# Patient Record
Sex: Female | Born: 1980 | Hispanic: Yes | Marital: Married | Smoking: Former smoker
Health system: Southern US, Community
[De-identification: ages and names within clinical notes are randomized; demographics above are authoritative.]

## PROBLEM LIST (undated history)

## (undated) DIAGNOSIS — O24419 Gestational diabetes mellitus in pregnancy, unspecified control: Secondary | ICD-10-CM

## (undated) DIAGNOSIS — F319 Bipolar disorder, unspecified: Secondary | ICD-10-CM

## (undated) DIAGNOSIS — F32A Depression, unspecified: Secondary | ICD-10-CM

## (undated) DIAGNOSIS — F329 Major depressive disorder, single episode, unspecified: Secondary | ICD-10-CM

## (undated) DIAGNOSIS — R7303 Prediabetes: Secondary | ICD-10-CM

## (undated) DIAGNOSIS — J189 Pneumonia, unspecified organism: Secondary | ICD-10-CM

## (undated) DIAGNOSIS — F419 Anxiety disorder, unspecified: Secondary | ICD-10-CM

## (undated) HISTORY — DX: Gestational diabetes mellitus in pregnancy, unspecified control: O24.419

## (undated) HISTORY — DX: Bipolar disorder, unspecified: F31.9

## (undated) HISTORY — DX: Major depressive disorder, single episode, unspecified: F32.9

## (undated) HISTORY — DX: Depression, unspecified: F32.A

## (undated) HISTORY — PX: TUBAL LIGATION: SHX77

## (undated) HISTORY — DX: Anxiety disorder, unspecified: F41.9

---

## 2016-06-30 DIAGNOSIS — Z0389 Encounter for observation for other suspected diseases and conditions ruled out: Secondary | ICD-10-CM | POA: Insufficient documentation

## 2017-05-31 DIAGNOSIS — M7712 Lateral epicondylitis, left elbow: Secondary | ICD-10-CM | POA: Insufficient documentation

## 2018-09-17 ENCOUNTER — Encounter: Payer: Self-pay | Admitting: Adult Health

## 2018-09-17 ENCOUNTER — Ambulatory Visit (INDEPENDENT_AMBULATORY_CARE_PROVIDER_SITE_OTHER): Admitting: Adult Health

## 2018-09-17 VITALS — BP 128/88 | HR 95 | Resp 16 | Ht 67.0 in | Wt 273.0 lb

## 2018-09-17 DIAGNOSIS — F17219 Nicotine dependence, cigarettes, with unspecified nicotine-induced disorders: Secondary | ICD-10-CM

## 2018-09-17 DIAGNOSIS — R0683 Snoring: Secondary | ICD-10-CM

## 2018-09-17 DIAGNOSIS — F411 Generalized anxiety disorder: Secondary | ICD-10-CM | POA: Diagnosis not present

## 2018-09-17 DIAGNOSIS — G4700 Insomnia, unspecified: Secondary | ICD-10-CM

## 2018-09-17 DIAGNOSIS — F339 Major depressive disorder, recurrent, unspecified: Secondary | ICD-10-CM | POA: Diagnosis not present

## 2018-09-17 DIAGNOSIS — M67479 Ganglion, unspecified ankle and foot: Secondary | ICD-10-CM

## 2018-09-17 DIAGNOSIS — Z6841 Body Mass Index (BMI) 40.0 and over, adult: Secondary | ICD-10-CM | POA: Diagnosis not present

## 2018-09-17 DIAGNOSIS — G4719 Other hypersomnia: Secondary | ICD-10-CM

## 2018-09-17 MED ORDER — HYDROXYZINE HCL 10 MG PO TABS: 10.0000 mg | ORAL_TABLET | Freq: Every evening | ORAL | 0 refills | Status: DC | PRN

## 2018-09-17 NOTE — Progress Notes (Signed)
[facility] [place], [place] [address]  Internal MEDICINE  Office Visit Note  Patient Name: Gloria Garza  [id number]  [id number]  Date of Service: 09/17/2018   Complaints/HPI Pt is here for establishment of PCP. Chief Complaint  Patient presents with  . Depression    new patient   . Anxiety  . Insomnia  . Foot Pain    left foot dorsal has a cyst like bump . swelling and pressure    HPI Pt here to establish care. Gloria Garza is a 37 year old obese African-American female.  She is new to the area as her wife has been re-stationed here with the [place] [employer].  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  She denies any daily medication use at this time, reports that she has been on some medications in the past however it was not in the recent past.  She does report a history of depression and anxiety, as well as insomnia.  Her wife reports that she snores very loudly and the patient reports that she wakes up coughing sometimes.  She reports being excessively tired during the day, and requiring multiple caffeinated beverages to try and stay awake.  She denies ever having a sleep study performed.  Patient is a 1/2-1 whole pack of cigarette smoker a day, she reports some alcohol use, and denies illicit drug use.  She is also complaining of a small cystlike area on the dorsum of her left foot.  She feels that she is getting one on her right foot also.  Will refer to podiatry  Current Medication: Outpatient Encounter Medications as of 09/17/2018  Medication Sig  . hydrOXYzine (ATARAX/VISTARIL) 10 MG tablet Take 1 tablet (10 mg total) by mouth at bedtime as needed for anxiety.   No facility-administered encounter medications on file as of 09/17/2018.     Surgical History: Past Surgical History:  Procedure Laterality Date  . CESAREAN SECTION    . TUBAL LIGATION      Medical History: Past Medical History:  Diagnosis Date   . Anxiety   . Depression   . Gestational diabetes     Family History: Family History  Problem Relation Age of Onset  . Diabetes Maternal Grandmother   . Cancer - Colon Maternal Grandmother   . Diabetes Paternal Grandmother   . Heart disease Paternal Grandmother     Social History   Socioeconomic History  . Marital status: Married    Spouse name: Not on file  . Number of children: Not on file  . Years of education: Not on file  . Highest education level: Not on file  Occupational History  . Not on file  Social Needs  . Financial resource strain: Not on file  . Food insecurity:    Worry: Not on file    Inability: Not on file  . Transportation needs:    Medical: Not on file    Non-medical: Not on file  Tobacco Use  . Smoking status: Current Some Day Smoker    Types: Cigarettes  . Smokeless tobacco: Never Used  Substance and Sexual Activity  . Alcohol use: Yes    Comment: occasional   . Drug use: Never  . Sexual activity: Not on file  Lifestyle  . Physical activity:    Days per week: Not on file    Minutes per session: Not on file  . Stress: Not on file  Relationships  . Social connections:  Talks on phone: Not on file    Gets together: Not on file    Attends religious service: Not on file    Active member of club or organization: Not on file    Attends meetings of clubs or organizations: Not on file    Relationship status: Not on file  . Intimate partner violence:    Fear of current or ex partner: Not on file    Emotionally abused: Not on file    Physically abused: Not on file    Forced sexual activity: Not on file  Other Topics Concern  . Not on file  Social History Narrative  . Not on file     Review of Systems  Constitutional: Negative for chills, fatigue and unexpected weight change.  HENT: Negative for congestion, rhinorrhea, sneezing and sore throat.   Eyes: Negative for photophobia, pain and redness.  Respiratory: Negative for cough, chest  tightness and shortness of breath.   Cardiovascular: Negative for chest pain and palpitations.  Gastrointestinal: Negative for abdominal pain, constipation, diarrhea, nausea and vomiting.  Endocrine: Negative.   Genitourinary: Negative for dysuria and frequency.  Musculoskeletal: Negative for arthralgias, back pain, joint swelling and neck pain.  Skin: Negative for rash.  Allergic/Immunologic: Negative.   Neurological: Negative for tremors and numbness.  Hematological: Negative for adenopathy. Does not bruise/bleed easily.  Psychiatric/Behavioral: Negative for behavioral problems and sleep disturbance. The patient is not nervous/anxious.     Vital Signs: BP 128/88   Pulse 95   Resp 16   Ht 5\' 7"  (1.702 m)   Wt 273 lb (123.8 kg)   LMP 09/10/2018   SpO2 97%   BMI 42.76 kg/m    Physical Exam  Constitutional: She is oriented to person, place, and time. She appears well-developed and well-nourished. No distress.  HENT:  Head: Normocephalic and atraumatic.  Mouth/Throat: Oropharynx is clear and moist. No oropharyngeal exudate.  Eyes: Pupils are equal, round, and reactive to light. EOM are normal.  Neck: Normal range of motion. Neck supple. No JVD present. No tracheal deviation present. No thyromegaly present.  Cardiovascular: Normal rate, regular rhythm and normal heart sounds. Exam reveals no gallop and no friction rub.  No murmur heard. Pulmonary/Chest: Effort normal and breath sounds normal. No respiratory distress. She has no wheezes. She has no rales. She exhibits no tenderness.  Abdominal: Soft. There is no tenderness. There is no guarding.  Musculoskeletal: Normal range of motion.  Lymphadenopathy:    She has no cervical adenopathy.  Neurological: She is alert and oriented to person, place, and time. No cranial nerve deficit.  Skin: Skin is warm and dry. She is not diaphoretic.  Psychiatric: She has a normal mood and affect. Her behavior is normal. Judgment and thought  content normal.  Nursing note and vitals reviewed.  Assessment/Plan: 1. GAD (generalized anxiety disorder) We will treat patient's anxiety with a small dose of hydroxyzine.  We discussed side effects of this medication as well as to not use with alcohol.  She verbalized understanding and will follow-up in 1 month to see how she is doing. - hydrOXYzine (ATARAX/VISTARIL) 10 MG tablet; Take 1 tablet (10 mg total) by mouth at bedtime as needed for anxiety.  Dispense: 30 tablet; Refill: 0  2. Ganglion cyst of foot Patient has approximately 3 to 4 cm cyst present on dorsum of left foot.  She feels as though one is beginning on her right foot as well.  Refer to podiatry for treatment. - Ambulatory referral  to Podiatry  3. Cigarette nicotine dependence with nicotine-induced disorder Smoking cessation discussed with patient at this time.  She reports she has attempted quitting multiple times in the past and has used Wellbutrin with no success.  She reports she is currently smoking half to 1 whole pack of cigarettes per day. Smoking cessation counseling: 1. Pt acknowledges the risks of long term smoking, she will try to quite smoking. 2. Options for different medications including nicotine products, chewing gum, patch etc, Wellbutrin and Chantix is discussed 3. Goal and date of compete cessation is discussed 4. Total time spent in smoking cessation is 15 min.   4. Depression, recurrent ([place]) Patient reports she is no longer taking anything for depression.  States her depression is improved since this move because she is now closer to family.  We will continue to monitor in the future.  5. Insomnia, unspecified type Patient reports increased anxiety as well as insomnia we will try low-dose hydroxyzine to see if we can manage those symptoms. - Home sleep test - hydrOXYzine (ATARAX/VISTARIL) 10 MG tablet; Take 1 tablet (10 mg total) by mouth at bedtime as needed for anxiety.  Dispense: 30 tablet;  Refill: 0  6. Excessive daytime sleepiness Patient requires multiple caffeinated beverages a day, will get home sleep study to evaluate for OSA.  7. Loud snoring Patient's wife complains of loud snoring, will rule out OSA.  8. BMI 40.0-44.9, adult (HCC) Obesity Counseling: Risk Assessment: An assessment of behavioral risk factors was made today and includes lack of exercise sedentary lifestyle, lack of portion control and poor dietary habits.  Risk Modification Advice: She was counseled on portion control guidelines. Restricting daily caloric intake to. . The detrimental long term effects of obesity on her health and ongoing poor compliance was also discussed with the patient.    General Counseling: Malaiyah verbalizes understanding of the findings of todays visit and agrees with plan of treatment. I have discussed any further diagnostic evaluation that may be needed or ordered today. We also reviewed her medications today. she has been encouraged to call the office with any questions or concerns that should arise related to todays visit.  Orders Placed This Encounter  Procedures  . Ambulatory referral to Podiatry  . Home sleep test    Meds ordered this encounter  Medications  . hydrOXYzine (ATARAX/VISTARIL) 10 MG tablet    Sig: Take 1 tablet (10 mg total) by mouth at bedtime as needed for anxiety.    Dispense:  30 tablet    Refill:  0    Time spent: 35 Minutes   This patient was seen by Orson Gear AGNP-C in Collaboration with Dr Lavera Guise as a part of collaborative care agreement  Kendell Bane AGNP-C Internal Medicine

## 2018-09-17 NOTE — Patient Instructions (Signed)

## 2018-10-10 ENCOUNTER — Other Ambulatory Visit: Payer: Self-pay | Admitting: Internal Medicine

## 2018-10-29 ENCOUNTER — Encounter: Payer: Self-pay | Admitting: Adult Health

## 2018-10-29 ENCOUNTER — Ambulatory Visit (INDEPENDENT_AMBULATORY_CARE_PROVIDER_SITE_OTHER): Admitting: Adult Health

## 2018-10-29 VITALS — BP 112/78 | HR 100 | Resp 16 | Ht 67.0 in | Wt 277.0 lb

## 2018-10-29 DIAGNOSIS — Z6841 Body Mass Index (BMI) 40.0 and over, adult: Secondary | ICD-10-CM | POA: Diagnosis not present

## 2018-10-29 DIAGNOSIS — M67479 Ganglion, unspecified ankle and foot: Secondary | ICD-10-CM

## 2018-10-29 DIAGNOSIS — F411 Generalized anxiety disorder: Secondary | ICD-10-CM

## 2018-10-29 DIAGNOSIS — F17219 Nicotine dependence, cigarettes, with unspecified nicotine-induced disorders: Secondary | ICD-10-CM | POA: Diagnosis not present

## 2018-10-29 DIAGNOSIS — R5383 Other fatigue: Secondary | ICD-10-CM | POA: Diagnosis not present

## 2018-10-29 DIAGNOSIS — F329 Major depressive disorder, single episode, unspecified: Secondary | ICD-10-CM | POA: Diagnosis not present

## 2018-10-29 DIAGNOSIS — F32A Depression, unspecified: Secondary | ICD-10-CM

## 2018-10-29 DIAGNOSIS — G4700 Insomnia, unspecified: Secondary | ICD-10-CM

## 2018-10-29 MED ORDER — HYDROXYZINE HCL 10 MG PO TABS: 20.0000 mg | ORAL_TABLET | Freq: Every evening | ORAL | 2 refills | Status: DC | PRN

## 2018-10-29 NOTE — Progress Notes (Signed)
[facility] [address], [place] [address]  Internal MEDICINE  Office Visit Note  Patient Name: Gloria Garza  [id number]  [id number]  Date of Service: 10/29/2018  Chief Complaint  Patient presents with  . Foot Pain    still having foot pain having difficulty finding a place that accepts insurance , would like to discuss weight loss   . Anxiety    HPI  Patient is here for follow-up on foot pain as well as depression and anxiety.  She reports that the Vistaril worked well at night to help her sleep as well as to help with some of her anxiety symptoms.  She would like to continue taking that medication at this time.  She reports that she supposed to see podiatry however has not been scheduled yet.  They are working on her insurance and she is supposed to be scheduled sometime this week.  She also continues to report some fatigue and issues with her weight gain.  She would like to discuss bariatric surgery which we will do at this time.   Current Medication: Outpatient Encounter Medications as of 10/29/2018  Medication Sig  . hydrOXYzine (ATARAX/VISTARIL) 10 MG tablet Take 2 tablets (20 mg total) by mouth at bedtime as needed for anxiety.  . [DISCONTINUED] hydrOXYzine (ATARAX/VISTARIL) 10 MG tablet Take 1 tablet (10 mg total) by mouth at bedtime as needed for anxiety. (Patient not taking: Reported on 10/29/2018)   No facility-administered encounter medications on file as of 10/29/2018.     Surgical History: Past Surgical History:  Procedure Laterality Date  . CESAREAN SECTION    . TUBAL LIGATION      Medical History: Past Medical History:  Diagnosis Date  . Anxiety   . Depression   . Gestational diabetes     Family History: Family History  Problem Relation Age of Onset  . Diabetes Maternal Grandmother   . Cancer - Colon Maternal Grandmother   . Diabetes Paternal Grandmother   . Heart disease Paternal Grandmother     Social History   Socioeconomic  History  . Marital status: Married    Spouse name: Not on file  . Number of children: Not on file  . Years of education: Not on file  . Highest education level: Not on file  Occupational History  . Not on file  Social Needs  . Financial resource strain: Not on file  . Food insecurity:    Worry: Not on file    Inability: Not on file  . Transportation needs:    Medical: Not on file    Non-medical: Not on file  Tobacco Use  . Smoking status: Current Some Day Smoker    Types: Cigarettes  . Smokeless tobacco: Never Used  Substance and Sexual Activity  . Alcohol use: Yes    Comment: occasional   . Drug use: Never  . Sexual activity: Not on file  Lifestyle  . Physical activity:    Days per week: Not on file    Minutes per session: Not on file  . Stress: Not on file  Relationships  . Social connections:    Talks on phone: Not on file    Gets together: Not on file    Attends religious service: Not on file    Active member of club or organization: Not on file    Attends meetings of clubs or organizations: Not on file    Relationship status: Not on file  . Intimate partner violence:  Fear of current or ex partner: Not on file    Emotionally abused: Not on file    Physically abused: Not on file    Forced sexual activity: Not on file  Other Topics Concern  . Not on file  Social History Narrative  . Not on file      Review of Systems  Constitutional: Negative for chills, fatigue and unexpected weight change.  HENT: Negative for congestion, rhinorrhea, sneezing and sore throat.   Eyes: Negative for photophobia, pain and redness.  Respiratory: Negative for cough, chest tightness and shortness of breath.   Cardiovascular: Negative for chest pain and palpitations.  Gastrointestinal: Negative for abdominal pain, constipation, diarrhea, nausea and vomiting.  Endocrine: Negative.   Genitourinary: Negative for dysuria and frequency.  Musculoskeletal: Negative for arthralgias,  back pain, joint swelling and neck pain.  Skin: Negative for rash.  Allergic/Immunologic: Negative.   Neurological: Negative for tremors and numbness.  Hematological: Negative for adenopathy. Does not bruise/bleed easily.  Psychiatric/Behavioral: Negative for behavioral problems and sleep disturbance. The patient is not nervous/anxious.     Vital Signs: BP 112/78 (BP Location: Left Arm, Patient Position: Sitting, Cuff Size: Normal)   Pulse 100   Resp 16   Ht 5\' 7"  (1.702 m)   Wt 277 lb (125.6 kg)   SpO2 99%   BMI 43.38 kg/m    Physical Exam  Constitutional: She is oriented to person, place, and time. She appears well-developed and well-nourished. No distress.  HENT:  Head: Normocephalic and atraumatic.  Mouth/Throat: Oropharynx is clear and moist. No oropharyngeal exudate.  Eyes: Pupils are equal, round, and reactive to light. EOM are normal.  Neck: Normal range of motion. Neck supple. No JVD present. No tracheal deviation present. No thyromegaly present.  Cardiovascular: Normal rate, regular rhythm and normal heart sounds. Exam reveals no gallop and no friction rub.  No murmur heard. Pulmonary/Chest: Effort normal and breath sounds normal. No respiratory distress. She has no wheezes. She has no rales. She exhibits no tenderness.  Abdominal: Soft. There is no tenderness. There is no guarding.  Musculoskeletal: Normal range of motion.  Lymphadenopathy:    She has no cervical adenopathy.  Neurological: She is alert and oriented to person, place, and time. No cranial nerve deficit.  Skin: Skin is warm and dry. She is not diaphoretic.  Psychiatric: She has a normal mood and affect. Her behavior is normal. Judgment and thought content normal.  Nursing note and vitals reviewed.   Assessment/Plan: 1. Depression, unspecified depression type Once again will refer patient to psychiatry.  She signed information release from [facility] to send to psychiatry referral source. - Ambulatory  referral to Psychiatry  2. Fatigue, unspecified type Labs ordered for fatigue and for future bariatric consultation. - B12 and Folate Panel - TSH + free T4 - Fe+TIBC+Fer - Vitamin D 1,25 dihydroxy  3. GAD (generalized anxiety disorder) Refill patient's hydroxyzine and increased dose at this time. - hydrOXYzine (ATARAX/VISTARIL) 10 MG tablet; Take 2 tablets (20 mg total) by mouth at bedtime as needed for anxiety.  Dispense: 60 tablet; Refill: 2  4. Insomnia, unspecified type Patient reports this really works well for her insomnia however she did notice that after a few weeks it took 2 pills so we have increased the dose at this time. - hydrOXYzine (ATARAX/VISTARIL) 10 MG tablet; Take 2 tablets (20 mg total) by mouth at bedtime as needed for anxiety.  Dispense: 60 tablet; Refill: 2  5. Ganglion cyst of foot Patient will  follow-up with podiatry in the next 2 weeks as appointment is made available for her.  6. Cigarette nicotine dependence with nicotine-induced disorder Once again discussed smoking cessation.  Patient reports she is cutting down in hopes of quitting soon. Smoking cessation counseling: 1. Pt acknowledges the risks of long term smoking, she will try to quite smoking. 2. Options for different medications including nicotine products, chewing gum, patch etc, Wellbutrin and Chantix is discussed 3. Goal and date of compete cessation is discussed 4. Total time spent in smoking cessation is 15 min.   7. BMI 40.0-44.9, adult (HCC) Obesity Counseling: Risk Assessment: An assessment of behavioral risk factors was made today and includes lack of exercise sedentary lifestyle, lack of portion control and poor dietary habits.  Risk Modification Advice: She was counseled on portion control guidelines. Restricting daily caloric intake to. . The detrimental long term effects of obesity on her health and ongoing poor compliance was also discussed with the patient.    General Counseling:  Tela verbalizes understanding of the findings of todays visit and agrees with plan of treatment. I have discussed any further diagnostic evaluation that may be needed or ordered today. We also reviewed her medications today. she has been encouraged to call the office with any questions or concerns that should arise related to todays visit.    Orders Placed This Encounter  Procedures  . B12 and Folate Panel  . TSH + free T4  . Fe+TIBC+Fer  . Vitamin D 1,25 dihydroxy  . Ambulatory referral to Psychiatry    Meds ordered this encounter  Medications  . hydrOXYzine (ATARAX/VISTARIL) 10 MG tablet    Sig: Take 2 tablets (20 mg total) by mouth at bedtime as needed for anxiety.    Dispense:  60 tablet    Refill:  2    Time spent: 25 Minutes   This patient was seen by Orson Gear AGNP-C in Collaboration with Dr Lavera Guise as a part of collaborative care agreement     Kendell Bane AGNP-C Internal medicine

## 2018-10-29 NOTE — Patient Instructions (Signed)
Foot Pain Many things can cause foot pain. Some common causes are:  An injury.  A sprain.  Arthritis.  Blisters.  Bunions.  Follow these instructions at home: Pay attention to any changes in your symptoms. Take these actions to help with your discomfort:  If directed, put ice on the affected area: ? Put ice in a plastic bag. ? Place a towel between your skin and the bag. ? Leave the ice on for 15-20 minutes, 3?4 times a day for 2 days.  Take over-the-counter and prescription medicines only as told by your health care provider.  Wear comfortable, supportive shoes that fit you well. Do not wear high heels.  Do not stand or walk for long periods of time.  Do not lift a lot of weight. This can put added pressure on your feet.  Do stretches to relieve foot pain and stiffness as told by your health care provider.  Rub your foot gently.  Keep your feet clean and dry.  Contact a health care provider if:  Your pain does not get better after a few days of self-care.  Your pain gets worse.  You cannot stand on your foot. Get help right away if:  Your foot is numb or tingling.  Your foot or toes are swollen.  Your foot or toes turn white or blue.  You have warmth and redness along your foot. This information is not intended to replace advice given to you by your health care provider. Make sure you discuss any questions you have with your health care provider. Document Released: 12/04/2015 Document Revised: 04/14/2016 Document Reviewed: 12/03/2014 Elsevier Interactive Patient Education  2018 Elsevier Inc.  

## 2018-10-31 ENCOUNTER — Other Ambulatory Visit: Payer: Self-pay | Admitting: Internal Medicine

## 2018-11-01 ENCOUNTER — Other Ambulatory Visit: Admitting: Internal Medicine

## 2018-11-01 DIAGNOSIS — G4710 Hypersomnia, unspecified: Secondary | ICD-10-CM

## 2018-11-01 NOTE — Progress Notes (Signed)
Patient picked up sleep study, instructions given patient will return tomorrow

## 2018-11-02 LAB — IRON,TIBC AND FERRITIN PANEL
Ferritin: 26 ng/mL (ref 15–150)
Iron Saturation: 28 % (ref 15–55)
Iron: 135 ug/dL (ref 27–159)
Total Iron Binding Capacity: 475 ug/dL — ABNORMAL HIGH (ref 250–450)
UIBC: 340 ug/dL (ref 131–425)

## 2018-11-02 LAB — VITAMIN D 1,25 DIHYDROXY
Vitamin D 1, 25 (OH)2 Total: 24 pg/mL
Vitamin D2 1, 25 (OH)2: <10 pg/mL
Vitamin D3 1, 25 (OH)2: 24 pg/mL

## 2018-11-02 LAB — TSH+FREE T4
Free T4: 1.26 ng/dL (ref 0.82–1.77)
TSH: 1.34 u[IU]/mL (ref 0.450–4.500)

## 2018-11-02 LAB — B12 AND FOLATE PANEL
Folate: 18.8 ng/mL (ref >3.0)
Vitamin B-12: 213 pg/mL — ABNORMAL LOW (ref 232–1245)

## 2018-11-05 ENCOUNTER — Ambulatory Visit: Payer: Self-pay | Admitting: Internal Medicine

## 2018-11-19 NOTE — Procedures (Signed)
[facility] [place], [place] [address]  Sleep Specialist: Allyne Gee, MD [place] Sleep Study Interpretation  Patient Name: Gloria Garza Patient MR WYSHUO:[id number] DOB:September 08, 1981  Date of Study: November 01, 2018  Indications for study: Hypersomnia and excessive sleepiness  BMI: 43.3 kg/m       Respiratory Data:  Total AHI: 3.3/h total RDI 6.8/h  Total Obstructive Apneas: 3  Total Central Apneas: 0  Total Mixed Apneas: 0  Total Hypopneas: 22  If the AHI is greater than 5 per hour patient qualifies for PAP evaluation  Oximetry Data:  Oxygen Desaturation Index: 6.2/h  Lowest Desaturation: 76%  Cardiac Data:  Minimum Heart Rate: 55 bpm  Maximum Heart Rate: 143 bpm   Impression / Diagnosis:  This apnea study does not show significant sleep apnea.  However patient does have significant oxygen desaturations and therefore nocturnal oxygen should be considered.  In addition a search for an treatment of any underlying cardiopulmonary disease is recommended.  If symptoms are persistent one could consider doing a in lab sleep study due to the severe desaturations noted.  GENERAL Recommendations:  1.  Consider Auto PAP with pressure ranges 5-20 cmH20 with download, or facility based PAP Titration Study  2.  Consider PAP interface mask fitted for patient comfort, Heated Humidification & PAP compliance monitoring (1 month, 3 months & 12 months after PAP initiation)  3. Consider treatment with mandibular advancement splint (MAS) or referral to an ENT surgeon for modification to the upper airway if the patient prefers an alternate therapy or the PAP trial is unsuccessful  4. Sleep hygiene measures should be discussed with the patient  5. Behavioral therapy such as weight reduction or smoking cessation as appropriate for the patient  6. Advise patient against the use of alcohol or sedatives in so much as these substances can worsen  excessive daytime sleepiness and respiratory disturbances of sleep  7. Advise patient against participating in potentially dangerous activities while drowsy such as operating a motor vehicle, heavy equipment or power tools as it can put them and others in danger  8. Advise patient of the long term consequences of OSA if left untreated, need for treatment and close follow up  9. Clinical follow up as deemed necessary     This Level III home sleep study was performed using the [employer], a 4 channel screening device subject to limitations. Depending on actual total sleep time, not measured in this study, the AHI (sum of apneas and hypopneas/hr of sleep) and therefore the severity of sleep apnea may be underestimated. As with any single night study, including Level 1 attended PSG, severity of sleep apnea may also be underestimated due to the lack of supine and/or REM sleep.  The interpretation associated with this report is based on normal values and degrees of severity in accordance with AASM parameters and/or estimated from multiple sources in the literature for adults ages 37-80+. These may not agree with the displayed values. The patient's treating physician should use the interpretation and recommendations in conjunction with the overall clinical evaluation and treatment of the patient.  Some of the terminology used in this scored [facility] report was developed several years ago and may not always be in accordance with current nomenclature. This in no way affects the accuracy of the data or the reliability of the interpretation and recommendations.

## 2018-12-18 ENCOUNTER — Encounter: Payer: Self-pay | Admitting: Psychiatry

## 2018-12-18 ENCOUNTER — Ambulatory Visit (INDEPENDENT_AMBULATORY_CARE_PROVIDER_SITE_OTHER): Admitting: Psychiatry

## 2018-12-18 ENCOUNTER — Other Ambulatory Visit: Payer: Self-pay

## 2018-12-18 VITALS — BP 149/89 | HR 92 | Temp 98.8°F | Wt 278.6 lb

## 2018-12-18 DIAGNOSIS — G4700 Insomnia, unspecified: Secondary | ICD-10-CM

## 2018-12-18 DIAGNOSIS — F411 Generalized anxiety disorder: Secondary | ICD-10-CM | POA: Diagnosis not present

## 2018-12-18 DIAGNOSIS — Z634 Disappearance and death of family member: Secondary | ICD-10-CM | POA: Diagnosis not present

## 2018-12-18 DIAGNOSIS — F3162 Bipolar disorder, current episode mixed, moderate: Secondary | ICD-10-CM

## 2018-12-18 MED ORDER — DIVALPROEX SODIUM ER 250 MG PO TB24: 250.0000 mg | ORAL_TABLET | Freq: Every day | ORAL | 0 refills | Status: DC

## 2018-12-18 MED ORDER — DIVALPROEX SODIUM ER 500 MG PO TB24: 500.0000 mg | ORAL_TABLET | Freq: Every day | ORAL | 0 refills | Status: DC

## 2018-12-18 MED ORDER — TRAZODONE HCL 50 MG PO TABS: 25.0000 mg | ORAL_TABLET | Freq: Every evening | ORAL | 0 refills | Status: DC | PRN

## 2018-12-18 NOTE — Patient Instructions (Signed)
Trazodone tablets What is this medicine? TRAZODONE (TRAZ oh done) is used to treat depression. This medicine may be used for other purposes; ask your health care provider or pharmacist if you have questions. COMMON BRAND NAME(S): Desyrel What should I tell my health care provider before I take this medicine? They need to know if you have any of these conditions: -attempted suicide or thinking about it -bipolar disorder -bleeding problems -glaucoma -heart disease, or previous heart attack -irregular heart beat -kidney or liver disease -low levels of sodium in the blood -an unusual or allergic reaction to trazodone, other medicines, foods, dyes or preservatives -pregnant or trying to get pregnant -breast-feeding How should I use this medicine? Take this medicine by mouth with a glass of water. Follow the directions on the prescription label. Take this medicine shortly after a meal or a light snack. Take your medicine at regular intervals. Do not take your medicine more often than directed. Do not stop taking this medicine suddenly except upon the advice of your doctor. Stopping this medicine too quickly may cause serious side effects or your condition may worsen. A special MedGuide will be given to you by the pharmacist with each prescription and refill. Be sure to read this information carefully each time. Talk to your pediatrician regarding the use of this medicine in children. Special care may be needed. Overdosage: If you think you have taken too much of this medicine contact a poison control center or emergency room at once. NOTE: This medicine is only for you. Do not share this medicine with others. What if I miss a dose? If you miss a dose, take it as soon as you can. If it is almost time for your next dose, take only that dose. Do not take double or extra doses. What may interact with this medicine? Do not take this medicine with any of the following medications: -certain medicines  for fungal infections like fluconazole, itraconazole, ketoconazole, posaconazole, voriconazole -cisapride -dofetilide -dronedarone -linezolid -MAOIs like Carbex, Eldepryl, Marplan, Nardil, and Parnate -mesoridazine -methylene blue (injected into a vein) -pimozide -saquinavir -thioridazine This medicine may also interact with the following medications: -alcohol -antiviral medicines for HIV or AIDS -aspirin and aspirin-like medicines -barbiturates like phenobarbital -certain medicines for blood pressure, heart disease, irregular heart beat -certain medicines for depression, anxiety, or psychotic disturbances -certain medicines for migraine headache like almotriptan, eletriptan, frovatriptan, naratriptan, rizatriptan, sumatriptan, zolmitriptan -certain medicines for seizures like carbamazepine and phenytoin -certain medicines for sleep -certain medicines that treat or prevent blood clots like dalteparin, enoxaparin, warfarin -digoxin -fentanyl -lithium -NSAIDS, medicines for pain and inflammation, like ibuprofen or naproxen -other medicines that prolong the QT interval (cause an abnormal heart rhythm) -rasagiline -supplements like St. John's wort, kava kava, valerian -tramadol -tryptophan This list may not describe all possible interactions. Give your health care provider a list of all the medicines, herbs, non-prescription drugs, or dietary supplements you use. Also tell them if you smoke, drink alcohol, or use illegal drugs. Some items may interact with your medicine. What should I watch for while using this medicine? Tell your doctor if your symptoms do not get better or if they get worse. Visit your doctor or health care professional for regular checks on your progress. Because it may take several weeks to see the full effects of this medicine, it is important to continue your treatment as prescribed by your doctor. Patients and their families should watch out for new or worsening  thoughts of suicide or depression. Also watch   out for sudden changes in feelings such as feeling anxious, agitated, panicky, irritable, hostile, aggressive, impulsive, severely restless, overly excited and hyperactive, or not being able to sleep. If this happens, especially at the beginning of treatment or after a change in dose, call your health care professional. Dennis Bast may get drowsy or dizzy. Do not drive, use machinery, or do anything that needs mental alertness until you know how this medicine affects you. Do not stand or sit up quickly, especially if you are an older patient. This reduces the risk of dizzy or fainting spells. Alcohol may interfere with the effect of this medicine. Avoid alcoholic drinks. This medicine may cause dry eyes and blurred vision. If you wear contact lenses you may feel some discomfort. Lubricating drops may help. See your eye doctor if the problem does not go away or is severe. Your mouth may get dry. Chewing sugarless gum, sucking hard candy and drinking plenty of water may help. Contact your doctor if the problem does not go away or is severe. What side effects may I notice from receiving this medicine? Side effects that you should report to your doctor or health care professional as soon as possible: -allergic reactions like skin rash, itching or hives, swelling of the face, lips, or tongue -elevated mood, decreased need for sleep, racing thoughts, impulsive behavior -confusion -fast, irregular heartbeat -feeling faint or lightheaded, falls -feeling agitated, angry, or irritable -loss of balance or coordination -painful or prolonged erections -restlessness, pacing, inability to keep still -suicidal thoughts or other mood changes -tremors -trouble sleeping -seizures -unusual bleeding or bruising Side effects that usually do not require medical attention (report to your doctor or health care professional if they continue or are bothersome): -change in sex drive or  performance -change in appetite or weight -constipation -headache -muscle aches or pains -nausea This list may not describe all possible side effects. Call your doctor for medical advice about side effects. You may report side effects to FDA at 1-800-FDA-1088. Where should I keep my medicine? Keep out of the reach of children. Store at room temperature between 15 and 30 degrees C (59 to 86 degrees F). Protect from light. Keep container tightly closed. Throw away any unused medicine after the expiration date. NOTE: This sheet is a summary. It may not cover all possible information. If you have questions about this medicine, talk to your doctor, pharmacist, or health care provider.  2019 [employer] Standard (2018-01-16 17:51:24) Valproic Acid, Divalproex Sodium delayed or extended-release tablets What is this medicine? DIVALPROEX SODIUM (dye VAL pro ex SO dee um) is used to prevent seizures caused by some forms of epilepsy. It is also used to treat bipolar mania and to prevent migraine headaches. This medicine may be used for other purposes; ask your health care provider or pharmacist if you have questions. COMMON BRAND NAME(S): Depakote, Depakote ER What should I tell my health care provider before I take this medicine? They need to know if you have any of these conditions: -if you often drink alcohol -kidney disease -liver disease -low platelet counts -mitochondrial disease -suicidal thoughts, plans, or attempt; a previous suicide attempt by you or a family member -urea cycle disorder (UCD) -an unusual or allergic reaction to divalproex sodium, sodium valproate, valproic acid, other medicines, foods, dyes, or preservatives -pregnant or trying to get pregnant -breast-feeding How should I use this medicine? Take this medicine by mouth with a drink of water. Follow the directions on the prescription label. Do not cut, crush or  chew this medicine. You can take it with or without food.  If it upsets your stomach, take it with food. Take your medicine at regular intervals. Do not take it more often than directed. Do not stop taking except on your doctor's advice. A special MedGuide will be given to you by the pharmacist with each prescription and refill. Be sure to read this information carefully each time. Talk to your pediatrician regarding the use of this medicine in children. While this drug may be prescribed for children as young as 10 years for selected conditions, precautions do apply. Overdosage: If you think you have taken too much of this medicine contact a poison control center or emergency room at once. NOTE: This medicine is only for you. Do not share this medicine with others. What if I miss a dose? If you miss a dose, take it as soon as you can. If it is almost time for your next dose, take only that dose. Do not take double or extra doses. What may interact with this medicine? Do not take this medicine with any of the following medications: -sodium phenylbutyrate This medicine may also interact with the following medications: -aspirin -certain antibiotics like ertapenem, imipenem, meropenem -certain medicines for depression, anxiety, or psychotic disturbances -certain medicines for seizures like carbamazepine, clonazepam, diazepam, ethosuximide, felbamate, lamotrigine, phenobarbital, phenytoin, primidone, rufinamide, topiramate -certain medicines that treat or prevent blood clots like warfarin -cholestyramine -female hormones, like estrogens and birth control pills, patches, or rings -propofol -rifampin -ritonavir -tolbutamide -zidovudine This list may not describe all possible interactions. Give your health care provider a list of all the medicines, herbs, non-prescription drugs, or dietary supplements you use. Also tell them if you smoke, drink alcohol, or use illegal drugs. Some items may interact with your medicine. What should I watch for while using this  medicine? Tell your doctor or health care professional if your symptoms do not get better or they start to get worse. Wear a medical ID bracelet or chain, and carry a card that describes your disease and details of your medicine and dosage times. You may get drowsy, dizzy, or have blurred vision. Do not drive, use machinery, or do anything that needs mental alertness until you know how this medicine affects you. To reduce dizzy or fainting spells, do not sit or stand up quickly, especially if you are an older patient. Alcohol can increase drowsiness and dizziness. Avoid alcoholic drinks. This medicine can make you more sensitive to the sun. Keep out of the sun. If you cannot avoid being in the sun, wear protective clothing and use sunscreen. Do not use sun lamps or tanning beds/booths. Patients and their families should watch out for new or worsening depression or thoughts of suicide. Also watch out for sudden changes in feelings such as feeling anxious, agitated, panicky, irritable, hostile, aggressive, impulsive, severely restless, overly excited and hyperactive, or not being able to sleep. If this happens, especially at the beginning of treatment or after a change in dose, call your health care professional. Women should inform their doctor if they wish to become pregnant or think they might be pregnant. There is a potential for serious side effects to an unborn child. Talk to your health care professional or pharmacist for more information. Women who become pregnant while using this medicine may enroll in the [place] Pregnancy Registry by calling [phone]. This registry collects information about the safety of antiepileptic drug use during pregnancy. This medicine may cause a decrease  in folic acid and vitamin D. You should make sure that you get enough vitamins while you are taking this medicine. Discuss the foods you eat and the vitamins you take with your health care  professional. What side effects may I notice from receiving this medicine? Side effects that you should report to your doctor or health care professional as soon as possible: -allergic reactions like skin rash, itching or hives, swelling of the face, lips, or tongue -changes in vision -redness, blistering, peeling or loosening of the skin, including inside the mouth -signs and symptoms of liver injury like dark yellow or brown urine; general ill feeling or flu-like symptoms; light-colored stools; loss of appetite; nausea; right upper belly pain; unusually weak or tired; yellowing of the eyes or skin -suicidal thoughts or other mood changes -unusual bleeding or bruising Side effects that usually do not require medical attention (report to your doctor or health care professional if they continue or are bothersome): -constipation -diarrhea -dizziness -hair loss -headache -loss of appetite -weight gain This list may not describe all possible side effects. Call your doctor for medical advice about side effects. You may report side effects to FDA at 1-800-FDA-1088. Where should I keep my medicine? Keep out of reach of children. Store at room temperature between 15 and 30 degrees C (59 and 86 degrees F). Keep container tightly closed. Throw away any unused medicine after the expiration date. NOTE: This sheet is a summary. It may not cover all possible information. If you have questions about this medicine, talk to your doctor, pharmacist, or health care provider.  2019 [employer] Standard (2017-07-31 10:48:27)
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
## 2018-12-20 ENCOUNTER — Ambulatory Visit (INDEPENDENT_AMBULATORY_CARE_PROVIDER_SITE_OTHER): Admitting: Adult Health

## 2018-12-20 ENCOUNTER — Other Ambulatory Visit: Payer: Self-pay | Admitting: Adult Health

## 2018-12-20 ENCOUNTER — Encounter: Payer: Self-pay | Admitting: Adult Health

## 2018-12-20 VITALS — BP 138/88 | Resp 16 | Ht 67.0 in | Wt 282.0 lb

## 2018-12-20 DIAGNOSIS — F17219 Nicotine dependence, cigarettes, with unspecified nicotine-induced disorders: Secondary | ICD-10-CM

## 2018-12-20 DIAGNOSIS — Z6841 Body Mass Index (BMI) 40.0 and over, adult: Secondary | ICD-10-CM | POA: Diagnosis not present

## 2018-12-20 DIAGNOSIS — G4710 Hypersomnia, unspecified: Secondary | ICD-10-CM | POA: Diagnosis not present

## 2018-12-20 DIAGNOSIS — G4734 Idiopathic sleep related nonobstructive alveolar hypoventilation: Secondary | ICD-10-CM | POA: Diagnosis not present

## 2018-12-20 NOTE — Progress Notes (Signed)
[facility] [place], [place] [address]  Pulmonary Sleep Medicine   Office Visit Note  Patient Name: Gloria Garza DOB: 11/15/1981 MRN [id number]  Date of Service: 01/08/2019  Complaints/HPI: Patient is here for follow-up on sleep study.  Her sleep study revealed a total AHI of 3.3 which is not qualify as obstructive sleep apnea.  However it is noted that her oxygen desaturation index is 6.2/h with the lowest desaturation of 76%.  Even though the patient does not have OSA it appears that she does have significant nocturnal hypoxia so we will do an overnight oximetry today if patient needs oxygen.  ROS  General: (-) fever, (-) chills, (-) night sweats, (-) weakness Skin: (-) rashes, (-) itching,. Eyes: (-) visual changes, (-) redness, (-) itching. Nose and Sinuses: (-) nasal stuffiness or itchiness, (-) postnasal drip, (-) nosebleeds, (-) sinus trouble. Mouth and Throat: (-) sore throat, (-) hoarseness. Neck: (-) swollen glands, (-) enlarged thyroid, (-) neck pain. Respiratory: - cough, (-) bloody sputum, - shortness of breath, - wheezing. Cardiovascular: - ankle swelling, (-) chest pain. Lymphatic: (-) lymph node enlargement. Neurologic: (-) numbness, (-) tingling. Psychiatric: (-) anxiety, (-) depression   Current Medication: Outpatient Encounter Medications as of 12/20/2018  Medication Sig  . divalproex (DEPAKOTE ER) 250 MG 24 hr tablet Take 1 tablet (250 mg total) by mouth daily. To be combined with 500 mg  . divalproex (DEPAKOTE ER) 500 MG 24 hr tablet Take 1 tablet (500 mg total) by mouth daily with supper. To be combined with 250 mg  . hydrOXYzine (ATARAX/VISTARIL) 10 MG tablet Take 2 tablets (20 mg total) by mouth at bedtime as needed for anxiety.  . traZODone (DESYREL) 50 MG tablet Take 0.5-1 tablets (25-50 mg total) by mouth at bedtime as needed for sleep. sleep   No facility-administered encounter medications on file as of 12/20/2018.      Surgical History: Past Surgical History:  Procedure Laterality Date  . CESAREAN SECTION    . TUBAL LIGATION      Medical History: Past Medical History:  Diagnosis Date  . Anxiety   . Bipolar disorder ([place])   . Depression   . Gestational diabetes     Family History: Family History  Problem Relation Age of Onset  . Diabetes Maternal Grandmother   . Cancer - Colon Maternal Grandmother   . Diabetes Paternal Grandmother   . Heart disease Paternal Grandmother   . Alcohol abuse Brother   . Drug abuse Brother     Social History: Social History   Socioeconomic History  . Marital status: Married    Spouse name: terah  . Number of children: 2  . Years of education: Not on file  . Highest education level: Associate degree: occupational, Hotel manager, or vocational program  Occupational History  . Not on file  Social Needs  . Financial resource strain: Not hard at all  . Food insecurity:    Worry: Never true    Inability: Never true  . Transportation needs:    Medical: No    Non-medical: No  Tobacco Use  . Smoking status: Former Smoker    Types: Cigarettes    Last attempt to quit: 07/18/2018    Years since quitting: 0.4  . Smokeless tobacco: Never Used  Substance and Sexual Activity  . Alcohol use: Not Currently    Comment: occasional   . Drug use: Never  . Sexual activity: Yes  Lifestyle  . Physical activity:    Days  per week: 0 days    Minutes per session: 0 min  . Stress: Very much  Relationships  . Social connections:    Talks on phone: Not on file    Gets together: Not on file    Attends religious service: Never    Active member of club or organization: No    Attends meetings of clubs or organizations: Never    Relationship status: Married  . Intimate partner violence:    Fear of current or ex partner: No    Emotionally abused: No    Physically abused: No    Forced sexual activity: No  Other Topics Concern  . Not on file  Social History Narrative   . Not on file    Vital Signs: Blood pressure 138/88, resp. rate 16, height 5\' 7"  (1.702 m), weight 282 lb (127.9 kg).  Examination: General Appearance: The patient is well-developed, well-nourished, and in no distress. Skin: Gross inspection of skin unremarkable. Head: normocephalic, no gross deformities. Eyes: no gross deformities noted. ENT: ears appear grossly normal no exudates. Neck: Supple. No thyromegaly. No LAD. Respiratory: clear bilaterally. Cardiovascular: Normal S1 and S2 without murmur or rub. Extremities: No cyanosis. pulses are equal. Neurologic: Alert and oriented. No involuntary movements.  LABS: Recent Results (from the past 2160 hour(s))  B12 and Folate Panel     Status: Abnormal   Collection Time: 10/29/18  3:59 PM  Result Value Ref Range   Vitamin B-12 213 (L) 232 - 1,245 pg/mL   Folate 18.8 >3.0 ng/mL    Comment: A serum folate concentration of less than 3.1 ng/mL is considered to represent clinical deficiency.   TSH + free T4     Status: None   Collection Time: 10/29/18  3:59 PM  Result Value Ref Range   TSH 1.340 0.450 - 4.500 uIU/mL   Free T4 1.26 0.82 - 1.77 ng/dL  Fe+TIBC+Fer     Status: Abnormal   Collection Time: 10/29/18  3:59 PM  Result Value Ref Range   Total Iron Binding Capacity 475 (H) 250 - 450 ug/dL   UIBC 340 131 - 425 ug/dL   Iron 135 27 - 159 ug/dL   Iron Saturation 28 15 - 55 %   Ferritin 26 15 - 150 ng/mL  Vitamin D 1,25 dihydroxy     Status: None   Collection Time: 10/29/18  3:59 PM  Result Value Ref Range   Vitamin D 1, 25 (OH)2 Total 24 pg/mL    Comment: Reference Range: Adults: 21 - 65    Vitamin D2 1, 25 (OH)2 <10 pg/mL   Vitamin D3 1, 25 (OH)2 24 pg/mL  POCT Influenza A/B     Status: Abnormal   Collection Time: 12/27/18  2:52 PM  Result Value Ref Range   Influenza A, POC Positive (A) Negative   Influenza B, POC Negative Negative  CBC with Differential/Platelet     Status: None   Collection Time: 12/31/18  7:55  AM  Result Value Ref Range   WBC 6.8 3.4 - 10.8 x10E3/uL   RBC 4.27 3.77 - 5.28 x10E6/uL   Hemoglobin 11.9 11.1 - 15.9 g/dL   Hematocrit 36.9 34.0 - 46.6 %   MCV 86 79 - 97 fL   MCH 27.9 26.6 - 33.0 pg   MCHC 32.2 31.5 - 35.7 g/dL   RDW 14.5 11.7 - 15.4 %   Platelets 250 150 - 450 x10E3/uL   Neutrophils 59 Not Estab. %   Lymphs 31 Not Estab. %  Monocytes 8 Not Estab. %   Eos 2 Not Estab. %   Basos 0 Not Estab. %   Neutrophils Absolute 3.9 1.4 - 7.0 x10E3/uL   Lymphocytes Absolute 2.1 0.7 - 3.1 x10E3/uL   Monocytes Absolute 0.6 0.1 - 0.9 x10E3/uL   EOS (ABSOLUTE) 0.2 0.0 - 0.4 x10E3/uL   Basophils Absolute 0.0 0.0 - 0.2 x10E3/uL   Immature Granulocytes 0 Not Estab. %   Immature Grans (Abs) 0.0 0.0 - 0.1 x10E3/uL  Comprehensive metabolic panel     Status: Abnormal   Collection Time: 12/31/18  7:55 AM  Result Value Ref Range   Glucose 130 (H) 65 - 99 mg/dL   BUN 6 6 - 20 mg/dL   Creatinine, Ser 0.59 0.57 - 1.00 mg/dL   GFR calc non Af Amer 119 >59 mL/min/1.73   GFR calc Af Amer 137 >59 mL/min/1.73   BUN/Creatinine Ratio 10 9 - 23   Sodium 142 134 - 144 mmol/L   Potassium 4.4 3.5 - 5.2 mmol/L   Chloride 106 96 - 106 mmol/L   CO2 21 20 - 29 mmol/L   Calcium 8.8 8.7 - 10.2 mg/dL   Total Protein 6.7 6.0 - 8.5 g/dL   Albumin 4.1 3.8 - 4.8 g/dL    Comment:               **Please note reference interval change**   Globulin, Total 2.6 1.5 - 4.5 g/dL   Albumin/Globulin Ratio 1.6 1.2 - 2.2   Bilirubin Total <0.2 0.0 - 1.2 mg/dL   Alkaline Phosphatase 88 39 - 117 IU/L   AST 14 0 - 40 IU/L   ALT 24 0 - 32 IU/L  Lipid Panel w/o Chol/HDL Ratio     Status: Abnormal   Collection Time: 12/31/18  7:55 AM  Result Value Ref Range   Cholesterol, Total 200 (H) 100 - 199 mg/dL   Triglycerides 262 (H) 0 - 149 mg/dL   HDL 48 >39 mg/dL   VLDL Cholesterol Cal 52 (H) 5 - 40 mg/dL   LDL Calculated 100 (H) 0 - 99 mg/dL  Hgb A1c w/o eAG     Status: Abnormal   Collection Time: 12/31/18  7:55 AM   Result Value Ref Range   Hgb A1c MFr Bld 6.0 (H) 4.8 - 5.6 %    Comment:          Prediabetes: 5.7 - 6.4          Diabetes: >6.4          Glycemic control for adults with diabetes: <7.0   Prolactin     Status: None   Collection Time: 12/31/18  7:55 AM  Result Value Ref Range   Prolactin 14.9 4.8 - 23.3 ng/mL  Valproic acid level     Status: Abnormal   Collection Time: 12/31/18  7:55 AM  Result Value Ref Range   Valproic Acid Lvl 22 (L) 50 - 100 ug/mL    Comment:                                 Detection Limit = 4                            <4 indicates None Detected Toxicity may occur at levels of 100-500. Measurements of free unbound valproic acid may improve the assess- ment of clinical response.  Radiology: Patient was never admitted.  No results found.  No results found.    Assessment and Plan: There are no active problems to display for this patient.  1. Hypersomnia Without the presence of sleep apnea it is possible that her hypersomnia is coming from nocturnal hypoxia.  Based on data from sleep study.  2. Nocturnal hypoxia With a desaturation 76% patient would like qualify for oxygen.  An overnight oximetry study will be done and patient will follow-up after results are able. - Pulse oximetry, overnight; Future  3. BMI 40.0-44.9, adult (HCC) Obesity Counseling: Risk Assessment: An assessment of behavioral risk factors was made today and includes lack of exercise sedentary lifestyle, lack of portion control and poor dietary habits.  Risk Modification Advice: She was counseled on portion control guidelines. Restricting daily caloric intake to. . The detrimental long term effects of obesity on her health and ongoing poor compliance was also discussed with the patient.  4. Cigarette nicotine dependence with nicotine-induced disorder Smoking cessation counseling: 1. Pt acknowledges the risks of long term smoking, she will try to quite smoking. 2. Options for  different medications including nicotine products, chewing gum, patch etc, Wellbutrin and Chantix is discussed 3. Goal and date of compete cessation is discussed 4. Total time spent in smoking cessation is 15 min.    General Counseling: I have discussed the findings of the evaluation and examination with Shamiya.  I have also discussed any further diagnostic evaluation thatmay be needed or ordered today. Monasia verbalizes understanding of the findings of todays visit. We also reviewed her medications today and discussed drug interactions and side effects including but not limited excessive drowsiness and altered mental states. We also discussed that there is always a risk not just to her but also people around her. she has been encouraged to call the office with any questions or concerns that should arise related to todays visit.    Time spent: 25  I have personally obtained a history, examined the patient, evaluated laboratory and imaging results, formulated the assessment and plan and placed orders.    Allyne Gee, MD [facility] Pulmonary and Critical Care Sleep medicine

## 2018-12-20 NOTE — Patient Instructions (Signed)
Hypoxia Hypoxia is a condition that happens when there is a lack of oxygen in the body's tissues and organs. When there is not enough oxygen, organs cannot work as they should. This causes serious problems throughout the body and in the brain. What are the causes? This condition may be caused by:  Exposure to high altitude.  A collapsed lung (pneumothorax).  Lung infection (pneumonia).  Lung injury.  Long-term (chronic) lung disease, such as COPD (chronic obstructive pulmonary disease).  Blood collecting in the chest cavity (hemothorax).  Food, saliva, or vomit getting into the airway (aspiration).  Reduced blood flow (ischemia).  Severe blood loss.  Slow or shallow breathing (hypoventilation).  Blood disorders, such as anemia.  Carbon monoxide poisoning.  The heart suddenly stopping (cardiac arrest).  Anesthetic medicines.  Drowning.  Choking. What are the signs or symptoms? Symptoms of this condition include:  Headache.  Fatigue.  Drowsiness.  Forgetfulness.  Nausea.  Confusion.  Shortness of breath.  Dizziness.  Bluish color of the skin, lips, or nail beds (cyanosis).  Change in consciousness or awareness. If hypoxia is not treated, it can lead to convulsions, loss of consciousness (coma), or brain damage. How is this diagnosed? This condition may be diagnosed based on:  A physical exam.  Blood tests.  A test that measures how much oxygen is in your blood (pulse oximetry). This is done with a sensor that is placed on your finger, toe, or earlobe.  Chest X-ray.  Tests to check your lung function (pulmonary function tests).  A test to check the electrical activity of your heart (electrocardiogram, ECG). You may have other tests to determine the cause of your hypoxia. How is this treated?  Treatment for this condition depends on what is causing the hypoxia. You will likely be treated with oxygen therapy. This may be done by giving you oxygen  through a face mask or through tubes in your nose. Your health care provider may also recommend other therapies to treat the underlying cause of your hypoxia. Follow these instructions at home:  Take over-the-counter and prescription medicines only as told by your health care provider.  Do not use any products that contain nicotine or tobacco, such as cigarettes and e-cigarettes. If you need help quitting, ask your health care provider.  Avoid secondhand smoke.  Work with your health care provider to manage any chronic conditions you have that may be causing hypoxia, such as COPD.  Keep all follow-up visits as told by your health care provider. This is important. Contact a health care provider if:  You have a fever.  You have trouble breathing, even after treatment.  You become extremely short of breath when you exercise. Get help right away if:  Your shortness of breath gets worse, especially with normal or very little activity.  Your skin, lips, or nail beds have a bluish color.  You become confused or you cannot think properly.  You have chest pain. Summary  Hypoxia is a condition that happens when there is a lack of oxygen in the body's tissues and organs.  If hypoxia is not treated, it can lead to convulsions, loss of consciousness (coma), or brain damage.  Symptoms of hypoxia can include a headache, shortness of breath, confusion, nausea, and a bluish skin color.  Hypoxia has many possible causes, including exposure to high altitude, carbon monoxide poisoning, or other health issues, such as blood disorders or cardiac arrest.  Hypoxia is usually treated with oxygen therapy. This information is not   intended to replace advice given to you by your health care provider. Make sure you discuss any questions you have with your health care provider. Document Released: 12/26/2016 Document Revised: 12/26/2016 Document Reviewed: 12/26/2016 Elsevier Interactive Patient Education   2019 Elsevier Inc.  

## 2018-12-27 ENCOUNTER — Encounter: Payer: Self-pay | Admitting: Adult Health

## 2018-12-27 ENCOUNTER — Ambulatory Visit (INDEPENDENT_AMBULATORY_CARE_PROVIDER_SITE_OTHER): Admitting: Adult Health

## 2018-12-27 VITALS — BP 140/93 | HR 70 | Temp 99.1°F | Resp 16 | Ht 67.0 in | Wt 280.0 lb

## 2018-12-27 DIAGNOSIS — J40 Bronchitis, not specified as acute or chronic: Secondary | ICD-10-CM

## 2018-12-27 DIAGNOSIS — J101 Influenza due to other identified influenza virus with other respiratory manifestations: Secondary | ICD-10-CM

## 2018-12-27 DIAGNOSIS — Z23 Encounter for immunization: Secondary | ICD-10-CM

## 2018-12-27 LAB — POCT INFLUENZA A/B
Influenza A, POC: POSITIVE — AB
Influenza B, POC: NEGATIVE

## 2018-12-27 MED ORDER — PROMETHAZINE-CODEINE 6.25-10 MG/5ML PO SYRP: 5.0000 mL | ORAL_SOLUTION | Freq: Four times a day (QID) | ORAL | 0 refills | Status: DC | PRN

## 2018-12-27 MED ORDER — AZITHROMYCIN 250 MG PO TABS: ORAL_TABLET | ORAL | 0 refills | Status: DC

## 2018-12-27 NOTE — Progress Notes (Signed)
[facility] [place], [place] [address]  Internal MEDICINE  Office Visit Note  Patient Name: Gloria Garza  [id number]  [id number]  Date of Service: 12/27/2018  Chief Complaint  Patient presents with  . Headache  . Cough  . Chills     HPI Pt is here for a sick visit. She reports 2 days of headache, cough, congestion, chills and body aches.  She is tested for Flu in office and found to have Influenza A at this visit.  She denies any recent fever, however she has not been able to take it. She denies any sick contact at this time, however she has multiple small children at home.      Current Medication:  Outpatient Encounter Medications as of 12/27/2018  Medication Sig  . divalproex (DEPAKOTE ER) 250 MG 24 hr tablet Take 1 tablet (250 mg total) by mouth daily. To be combined with 500 mg  . divalproex (DEPAKOTE ER) 500 MG 24 hr tablet Take 1 tablet (500 mg total) by mouth daily with supper. To be combined with 250 mg  . hydrOXYzine (ATARAX/VISTARIL) 10 MG tablet Take 2 tablets (20 mg total) by mouth at bedtime as needed for anxiety.  . traZODone (DESYREL) 50 MG tablet Take 0.5-1 tablets (25-50 mg total) by mouth at bedtime as needed for sleep. sleep   No facility-administered encounter medications on file as of 12/27/2018.       Medical History: Past Medical History:  Diagnosis Date  . Anxiety   . Bipolar disorder ([place])   . Depression   . Gestational diabetes      Vital Signs: BP (!) 140/93   Pulse 70   Temp 99.1 F (37.3 C)   Resp 16   Ht 5\' 7"  (1.702 m)   Wt 280 lb (127 kg)   SpO2 97%   BMI 43.85 kg/m    Review of Systems  Constitutional: Positive for chills, fatigue and fever. Negative for unexpected weight change.  HENT: Positive for sinus pressure and sinus pain. Negative for congestion, rhinorrhea, sneezing and sore throat.   Eyes: Negative for photophobia, pain and redness.  Respiratory: Positive for cough. Negative for chest  tightness and shortness of breath.   Cardiovascular: Negative for chest pain and palpitations.  Gastrointestinal: Negative for abdominal pain, constipation, diarrhea, nausea and vomiting.  Endocrine: Negative.   Genitourinary: Negative for dysuria and frequency.  Musculoskeletal: Negative for arthralgias, back pain, joint swelling and neck pain.  Skin: Negative for rash.  Allergic/Immunologic: Negative.   Neurological: Negative for tremors and numbness.  Hematological: Negative for adenopathy. Does not bruise/bleed easily.  Psychiatric/Behavioral: Negative for behavioral problems and sleep disturbance. The patient is not nervous/anxious.     Physical Exam Vitals signs and nursing note reviewed.  Constitutional:      General: She is not in acute distress.    Appearance: She is well-developed. She is not diaphoretic.  HENT:     Head: Normocephalic and atraumatic.     Mouth/Throat:     Pharynx: No oropharyngeal exudate.  Eyes:     Pupils: Pupils are equal, round, and reactive to light.  Neck:     Musculoskeletal: Normal range of motion and neck supple.     Thyroid: No thyromegaly.     Vascular: No JVD.     Trachea: No tracheal deviation.  Cardiovascular:     Rate and Rhythm: Normal rate and regular rhythm.     Heart sounds: Normal heart sounds. No murmur. No  friction rub. No gallop.   Pulmonary:     Effort: Pulmonary effort is normal. No respiratory distress.     Breath sounds: Normal breath sounds. No wheezing or rales.  Chest:     Chest wall: No tenderness.  Abdominal:     Palpations: Abdomen is soft.     Tenderness: There is no abdominal tenderness. There is no guarding.  Musculoskeletal: Normal range of motion.  Lymphadenopathy:     Cervical: No cervical adenopathy.  Skin:    General: Skin is warm and dry.  Neurological:     Mental Status: She is alert and oriented to person, place, and time.     Cranial Nerves: No cranial nerve deficit.  Psychiatric:         Behavior: Behavior normal.        Thought Content: Thought content normal.        Judgment: Judgment normal.    Assessment/Plan: 1. Influenza A Rest, drink plenty of fluids and follow up as needed in clinic.  2. Flu vaccine need Positive for Flu A - POCT Influenza A/B  3. Bronchitis Stable, continue current medications.   General Counseling: Gloria Garza verbalizes understanding of the findings of todays visit and agrees with plan of treatment. I have discussed any further diagnostic evaluation that may be needed or ordered today. We also reviewed her medications today. she has been encouraged to call the office with any questions or concerns that should arise related to todays visit.   Orders Placed This Encounter  Procedures  . POCT Influenza A/B    No orders of the defined types were placed in this encounter.   Time spent: 25 Minutes  This patient was seen by Orson Gear AGNP-C in Collaboration with Dr Lavera Guise as a part of collaborative care agreement.  Kendell Bane AGNP-C Internal Medicine

## 2018-12-27 NOTE — Patient Instructions (Signed)

## 2018-12-31 ENCOUNTER — Other Ambulatory Visit: Payer: Self-pay | Admitting: Psychiatry

## 2019-01-01 ENCOUNTER — Ambulatory Visit: Admitting: Psychiatry

## 2019-01-01 LAB — CBC WITH DIFFERENTIAL/PLATELET
Basophils Absolute: 0 10*3/uL (ref 0.0–0.2)
Basos: 0 %
EOS (ABSOLUTE): 0.2 10*3/uL (ref 0.0–0.4)
Eos: 2 %
Hematocrit: 36.9 % (ref 34.0–46.6)
Hemoglobin: 11.9 g/dL (ref 11.1–15.9)
Immature Grans (Abs): 0 10*3/uL (ref 0.0–0.1)
Immature Granulocytes: 0 %
Lymphocytes Absolute: 2.1 10*3/uL (ref 0.7–3.1)
Lymphs: 31 %
MCH: 27.9 pg (ref 26.6–33.0)
MCHC: 32.2 g/dL (ref 31.5–35.7)
MCV: 86 fL (ref 79–97)
Monocytes Absolute: 0.6 10*3/uL (ref 0.1–0.9)
Monocytes: 8 %
Neutrophils Absolute: 3.9 10*3/uL (ref 1.4–7.0)
Neutrophils: 59 %
Platelets: 250 10*3/uL (ref 150–450)
RBC: 4.27 x10E6/uL (ref 3.77–5.28)
RDW: 14.5 % (ref 11.7–15.4)
WBC: 6.8 10*3/uL (ref 3.4–10.8)

## 2019-01-01 LAB — COMPREHENSIVE METABOLIC PANEL
ALT: 24 IU/L (ref 0–32)
AST: 14 IU/L (ref 0–40)
Albumin/Globulin Ratio: 1.6 (ref 1.2–2.2)
Albumin: 4.1 g/dL (ref 3.8–4.8)
Alkaline Phosphatase: 88 IU/L (ref 39–117)
BUN/Creatinine Ratio: 10 (ref 9–23)
BUN: 6 mg/dL (ref 6–20)
Bilirubin Total: <0.2 mg/dL (ref 0.0–1.2)
CO2: 21 mmol/L (ref 20–29)
Calcium: 8.8 mg/dL (ref 8.7–10.2)
Chloride: 106 mmol/L (ref 96–106)
Creatinine, Ser: 0.59 mg/dL (ref 0.57–1.00)
GFR calc Af Amer: 137 mL/min/{1.73_m2} (ref >59)
GFR calc non Af Amer: 119 mL/min/{1.73_m2} (ref >59)
Globulin, Total: 2.6 g/dL (ref 1.5–4.5)
Glucose: 130 mg/dL — ABNORMAL HIGH (ref 65–99)
Potassium: 4.4 mmol/L (ref 3.5–5.2)
Sodium: 142 mmol/L (ref 134–144)
Total Protein: 6.7 g/dL (ref 6.0–8.5)

## 2019-01-01 LAB — LIPID PANEL W/O CHOL/HDL RATIO
Cholesterol, Total: 200 mg/dL — ABNORMAL HIGH (ref 100–199)
HDL: 48 mg/dL (ref >39)
LDL Calculated: 100 mg/dL — ABNORMAL HIGH (ref 0–99)
Triglycerides: 262 mg/dL — ABNORMAL HIGH (ref 0–149)
VLDL Cholesterol Cal: 52 mg/dL — ABNORMAL HIGH (ref 5–40)

## 2019-01-01 LAB — VALPROIC ACID LEVEL: Valproic Acid Lvl: 22 ug/mL — ABNORMAL LOW (ref 50–100)

## 2019-01-01 LAB — HGB A1C W/O EAG: Hgb A1c MFr Bld: 6 % — ABNORMAL HIGH (ref 4.8–5.6)

## 2019-01-01 LAB — PROLACTIN: Prolactin: 14.9 ng/mL (ref 4.8–23.3)

## 2019-01-07 ENCOUNTER — Ambulatory Visit: Admitting: Licensed Clinical Social Worker

## 2019-01-14 ENCOUNTER — Ambulatory Visit (INDEPENDENT_AMBULATORY_CARE_PROVIDER_SITE_OTHER): Admitting: Psychiatry

## 2019-01-14 ENCOUNTER — Other Ambulatory Visit: Payer: Self-pay

## 2019-01-14 ENCOUNTER — Encounter: Payer: Self-pay | Admitting: Psychiatry

## 2019-01-14 VITALS — BP 131/83 | HR 93 | Temp 99.0°F | Wt 283.8 lb

## 2019-01-14 DIAGNOSIS — F411 Generalized anxiety disorder: Secondary | ICD-10-CM

## 2019-01-14 DIAGNOSIS — G4700 Insomnia, unspecified: Secondary | ICD-10-CM | POA: Diagnosis not present

## 2019-01-14 DIAGNOSIS — Z634 Disappearance and death of family member: Secondary | ICD-10-CM

## 2019-01-14 DIAGNOSIS — F3162 Bipolar disorder, current episode mixed, moderate: Secondary | ICD-10-CM

## 2019-01-14 DIAGNOSIS — F40298 Other specified phobia: Secondary | ICD-10-CM

## 2019-01-14 MED ORDER — DIVALPROEX SODIUM ER 500 MG PO TB24: 1000.0000 mg | ORAL_TABLET | Freq: Every day | ORAL | 0 refills | Status: DC

## 2019-01-14 MED ORDER — TRAZODONE HCL 100 MG PO TABS: 100.0000 mg | ORAL_TABLET | Freq: Every day | ORAL | 0 refills | Status: DC

## 2019-01-14 MED ORDER — CLONAZEPAM 0.5 MG PO TABS: 0.5000 mg | ORAL_TABLET | ORAL | 0 refills | Status: DC

## 2019-01-14 NOTE — Patient Instructions (Signed)
Motion Sickness Motion sickness can happen when you travel in a boat, car, or airplane, or when you go on an amusement park ride. You may:  Feel dizzy.  Feel sick to your stomach (nauseous).  Throw up (vomit).  Be sweaty.  Have belly (abdominal) pain.  Be pale. These problems usually go away when the motion stops. For some people, problems may last for hours or days. There are things that you can do to help prevent motion sickness. Follow these instructions at home: Medicines  Take or use over-the-counter and prescription medicines only as told by your doctor.  If you use a motion sickness patch, wash your hands right after you put the patch on. Eating and drinking   Drink enough fluid to keep your pee (urine) pale yellow.  While having motion sickness, take small sips of liquids often. Keeping enough fluid in your body (staying hydrated) may help relieve or prevent problems.  Do not eat large meals before or during travel. If you are traveling far, eat small, plain meals.  Do not drink alcohol before or during travel. When riding in a moving vehicle:  Sit where the least amount of motion is happening. ? On an airplane, sit near the wing. Lie back in your seat if you can. ? On a boat, sit near the middle. ? In a car, sit in the front seat. Avoid the back seat.  Breathe slowly and deeply.  Do not read or focus on nearby things such as your phone.  Try watching the horizon or a distant object. This is especially helpful when you are on a boat. In a car, ride in the front seat and look out the front window.  Get some fresh air if you can. Open a window when you are riding in a car. General instructions  If possible, avoid activities that cause motion sickness.  Do not smoke before or during travel. Avoid areas where people are smoking.  Plan ahead for travel. Ask your doctor if you should take medicines to help prevent motion sickness. Contact a doctor if:  You  still throw up or feel sick to your stomach after 24 hours.  You see blood in your vomit. The blood might be dark red, or it may look like coffee grounds.  You pass out (faint).  You feel very dizzy or light-headed when you stand up.  You have a fever. Get help right away if:  You have very bad belly pain.  You have very bad chest pain.  You have trouble breathing.  You have a very bad headache.  You lose feeling (have numbness) on one side of your body.  You feel weak on one side of your body.  You have trouble speaking. Summary  Motion sickness can happen when you travel in a boat, car, or airplane, or when you go on an amusement park ride.  Problems usually go away when the motion stops.  Plan ahead for travel. Ask your doctor if you should take medicines to help prevent motion sickness. This information is not intended to replace advice given to you by your health care provider. Make sure you discuss any questions you have with your health care provider. Document Released: 10/27/2011 Document Revised: 08/17/2017 Document Reviewed: 08/17/2017 Elsevier Interactive Patient Education  2019 Elsevier Inc. Clonazepam tablets What is this medicine? CLONAZEPAM (kloe NA ze pam) is a benzodiazepine. It is used to treat certain types of seizures. It is also used to treat panic disorder. This  medicine may be used for other purposes; ask your health care provider or pharmacist if you have questions. COMMON BRAND NAME(S): Ceberclon, Klonopin What should I tell my health care provider before I take this medicine? They need to know if you have any of these conditions: -an alcohol or drug abuse problem -bipolar disorder, depression, psychosis or other mental health condition -glaucoma -kidney or liver disease -lung or breathing disease -myasthenia gravis -Parkinson's disease -porphyria -seizures or a history of seizures -suicidal thoughts -an unusual or allergic reaction to  clonazepam, other benzodiazepines, foods, dyes, or preservatives -pregnant or trying to get pregnant -breast-feeding How should I use this medicine? Take this medicine by mouth with a glass of water. Follow the directions on the prescription label. If it upsets your stomach, take it with food or milk. Take your medicine at regular intervals. Do not take it more often than directed. Do not stop taking or change the dose except on the advice of your doctor or health care professional. A special MedGuide will be given to you by the pharmacist with each prescription and refill. Be sure to read this information carefully each time. Talk to your pediatrician regarding the use of this medicine in children. Special care may be needed. Overdosage: If you think you have taken too much of this medicine contact a poison control center or emergency room at once. NOTE: This medicine is only for you. Do not share this medicine with others. What if I miss a dose? If you miss a dose, take it as soon as you can. If it is almost time for your next dose, take only that dose. Do not take double or extra doses. What may interact with this medicine? Do not take this medication with any of the following medicines: -narcotic medicines for cough -sodium oxybate This medicine may also interact with the following medications: -alcohol -antihistamines for allergy, cough and cold -antiviral medicines for HIV or AIDS -certain medicines for anxiety or sleep -certain medicines for depression, like amitriptyline, fluoxetine, sertraline -certain medicines for fungal infections like ketoconazole and itraconazole -certain medicines for seizures like carbamazepine, phenobarbital, phenytoin, primidone -general anesthetics like halothane, isoflurane, methoxyflurane, propofol -local anesthetics like lidocaine, pramoxine, tetracaine -medicines that relax muscles for surgery -narcotic medicines for pain -phenothiazines like  chlorpromazine, mesoridazine, prochlorperazine, thioridazine This list may not describe all possible interactions. Give your health care provider a list of all the medicines, herbs, non-prescription drugs, or dietary supplements you use. Also tell them if you smoke, drink alcohol, or use illegal drugs. Some items may interact with your medicine. What should I watch for while using this medicine? Tell your doctor or health care professional if your symptoms do not start to get better or if they get worse. Do not stop taking except on your doctor's advice. You may develop a severe reaction. Your doctor will tell you how much medicine to take. You may get drowsy or dizzy. Do not drive, use machinery, or do anything that needs mental alertness until you know how this medicine affects you. To reduce the risk of dizzy and fainting spells, do not stand or sit up quickly, especially if you are an older patient. Alcohol may increase dizziness and drowsiness. Avoid alcoholic drinks. If you are taking another medicine that also causes drowsiness, you may have more side effects. Give your health care provider a list of all medicines you use. Your doctor will tell you how much medicine to take. Do not take more medicine than directed. Call  emergency for help if you have problems breathing or unusual sleepiness. The use of this medicine may increase the chance of suicidal thoughts or actions. Pay special attention to how you are responding while on this medicine. Any worsening of mood, or thoughts of suicide or dying should be reported to your health care professional right away. What side effects may I notice from receiving this medicine? Side effects that you should report to your doctor or health care professional as soon as possible: -allergic reactions like skin rash, itching or hives, swelling of the face, lips, or tongue -breathing problems -confusion -loss of balance or coordination -signs and symptoms of  low blood pressure like dizziness; feeling faint or lightheaded, falls; unusually weak or tired -suicidal thoughts or mood changes Side effects that usually do not require medical attention (report to your doctor or health care professional if they continue or are bothersome): -dizziness -headache -tiredness -upset stomach This list may not describe all possible side effects. Call your doctor for medical advice about side effects. You may report side effects to FDA at 1-800-FDA-1088. Where should I keep my medicine? Keep out of the reach of children. This medicine can be abused. Keep your medicine in a safe place to protect it from theft. Do not share this medicine with anyone. Selling or giving away this medicine is dangerous and against the law. This medicine may cause accidental overdose and death if taken by other adults, children, or pets. Mix any unused medicine with a substance like cat litter or coffee grounds. Then throw the medicine away in a sealed container like a sealed bag or a coffee can with a lid. Do not use the medicine after the expiration date. Store at room temperature between 15 and 30 degrees C (59 and 86 degrees F). Protect from light. Keep container tightly closed. NOTE: This sheet is a summary. It may not cover all possible information. If you have questions about this medicine, talk to your doctor, pharmacist, or health care provider.  2019 Elsevier/Gold Standard (2016-04-15 18:46:32)

## 2019-01-14 NOTE — Progress Notes (Signed)
[facility] MD OP Progress Note  01/14/2019 5:23 PM Gloria Garza  MRN:  [id number]  Chief Complaint: ' I am here for follow up.' Chief Complaint    Follow-up; Medication Refill     HPI: Gloria Garza is a 38 year old female, lives in [place], has a history of depression, anxiety, presented to clinic today for a follow-up visit.  Patient today reports she continues to struggle with some mood lability, irritability, restlessness, anxiety and so on.  Patient reports the Depakote initially helped however currently she feels like she may be getting used to the dosage.  Patient also reports sleep as restless on trazodone 50 mg.  She is interested in a dosage increase.  Patient reports she is going to visit her sister in [place].  She reports she has a fear of flying.  She reports she has tingling and numbness on her face, chest pain, nausea and so on when she has to fly.  She is interested in a medication to help with the same.  Discussed labs that were reported on 12/31/2018 with patient.  Also discussed to get another Depakote level done.   visit Diagnosis:    ICD-10-CM   1. Bipolar 1 disorder, mixed, moderate (HCC) F31.62 divalproex (DEPAKOTE ER) 500 MG 24 hr tablet  2. Insomnia, unspecified type G47.00 traZODone (DESYREL) 100 MG tablet  3. GAD (generalized anxiety disorder) F41.1   4. Bereavement Z63.4   5. Specific phobia F40.298 clonazePAM (KLONOPIN) 0.5 MG tablet   FLYING    Past Psychiatric History: I have reviewed past psychiatric history from my progress note on 12/18/2018.  Past trials of Zoloft  Past Medical History:  Past Medical History:  Diagnosis Date  . Anxiety   . Bipolar disorder ([place])   . Depression   . Gestational diabetes     Past Surgical History:  Procedure Laterality Date  . CESAREAN SECTION    . TUBAL LIGATION      Family Psychiatric History: Reviewed family psychiatric history from my progress note on 12/18/2018  Family History:  Family History  Problem Relation  Age of Onset  . Diabetes Maternal Grandmother   . Cancer - Colon Maternal Grandmother   . Diabetes Paternal Grandmother   . Heart disease Paternal Grandmother   . Alcohol abuse Brother   . Drug abuse Brother     Social History: Reviewed social history from my progress note on 12/18/2018 Social History   Socioeconomic History  . Marital status: Married    Spouse name: terah  . Number of children: 2  . Years of education: Not on file  . Highest education level: Associate degree: occupational, Hotel manager, or vocational program  Occupational History  . Not on file  Social Needs  . Financial resource strain: Not hard at all  . Food insecurity:    Worry: Never true    Inability: Never true  . Transportation needs:    Medical: No    Non-medical: No  Tobacco Use  . Smoking status: Former Smoker    Types: Cigarettes    Last attempt to quit: 07/18/2018    Years since quitting: 0.4  . Smokeless tobacco: Never Used  Substance and Sexual Activity  . Alcohol use: Not Currently    Comment: occasional   . Drug use: Never  . Sexual activity: Yes  Lifestyle  . Physical activity:    Days per week: 0 days    Minutes per session: 0 min  . Stress: Very much  Relationships  . Social connections:  Talks on phone: Not on file    Gets together: Not on file    Attends religious service: Never    Active member of club or organization: No    Attends meetings of clubs or organizations: Never    Relationship status: Married  Other Topics Concern  . Not on file  Social History Narrative  . Not on file    Allergies: No Known Allergies  Metabolic Disorder Labs: Lab Results  Component Value Date   HGBA1C 6.0 (H) 12/31/2018   Lab Results  Component Value Date   PROLACTIN 14.9 12/31/2018   Lab Results  Component Value Date   CHOL 200 (H) 12/31/2018   TRIG 262 (H) 12/31/2018   HDL 48 12/31/2018   LDLCALC 100 (H) 12/31/2018   Lab Results  Component Value Date   TSH 1.340  10/29/2018    Therapeutic Level Labs: No results found for: LITHIUM Lab Results  Component Value Date   VALPROATE 22 (L) 12/31/2018   No components found for:  CBMZ  Current Medications: Current Outpatient Medications  Medication Sig Dispense Refill  . azithromycin (ZITHROMAX) 250 MG tablet Take as directed. 6 tablet 0  . divalproex (DEPAKOTE ER) 500 MG 24 hr tablet Take 2 tablets (1,000 mg total) by mouth daily with supper. 180 tablet 0  . promethazine-codeine (PHENERGAN WITH CODEINE) 6.25-10 MG/5ML syrup Take 5 mLs by mouth every 6 (six) hours as needed for cough. 120 mL 0  . clonazePAM (KLONOPIN) 0.5 MG tablet Take 1 tablet (0.5 mg total) by mouth as directed. DAILY ONCE AS NEEDED PRIOR TO GETTING ON FLIGHT 5 tablet 0  . traZODone (DESYREL) 100 MG tablet Take 1 tablet (100 mg total) by mouth at bedtime. SLEEP 90 tablet 0   No current facility-administered medications for this visit.      Musculoskeletal: Strength & Muscle Tone: within normal limits Gait & Station: normal Patient leans: N/A  Psychiatric Specialty Exam: Review of Systems  Psychiatric/Behavioral: The patient is nervous/anxious.   All other systems reviewed and are negative.   Blood pressure 131/83, pulse 93, temperature 99 F (37.2 C), temperature source Oral, weight 283 lb 12.8 oz (128.7 kg).Body mass index is 44.45 kg/m.  General Appearance: Casual  Eye Contact:  Fair  Speech:  Clear and Coherent  Volume:  Normal  Mood:  Anxious  Affect:  Appropriate  Thought Process:  Goal Directed and Descriptions of Associations: Intact  Orientation:  Full (Time, Place, and Person)  Thought Content: Logical   Suicidal Thoughts:  No  Homicidal Thoughts:  No  Memory:  Immediate;   Fair Recent;   Fair Remote;   Fair  Judgement:  Fair  Insight:  Fair  Psychomotor Activity:  Normal  Concentration:  Concentration: Fair and Attention Span: Fair  Recall:  [employer] of Knowledge: Fair  Language: Fair  Akathisia:   No  Handed:  Right  AIMS (if indicated): denies tremors, rigidity,stiffness  Assets:  Communication Skills Desire for Improvement Social Support  ADL's:  Intact  Cognition: WNL  Sleep:  Poor   Screenings: PHQ2-9     Office Visit from 12/20/2018 in [facility], [facility] Office Visit from 09/17/2018 in [facility], [facility]  PHQ-2 Total Score  1  0       Assessment and Plan: Cherryl is a 38 year old female who has a history of anxiety, depression, presented to clinic today for a follow-up visit.  Patient is biologically predisposed given her history of trauma.  Patient  also has psychosocial stressors of several deaths in her family.  Patient continues to struggle with mood symptoms and sleep problems.  Will continue plan as noted below.  Plan Bipolar disorder type I mixed moderate-unstable Increase Depakote ER to 1000 mg p.o. daily with supper. I have reviewed labs with patient dated-12/31/2018-Depakote level-22-subtherapeutic. I have reviewed other labs-prolactin-within normal limits, hemoglobin A1c-6 slightly elevated, lipid panel-abnormal, CBC and CMP-within normal limits.  Patient advised to follow-up with her primary medical doctor for management of her lipid panel.  Insomnia- unstable Increase trazodone to 100 mg p.o. nightly   For generalized anxiety disorder-unstable Patient to continue psychotherapy visits.  For bereavement-unstable Patient will continue grief therapy.  For specific phobia-unstable Start Klonopin 0.5 mg as needed during flight-we will give her 5 pills. I have reviewed [place] controlled substance database.  Will order labs-Depakote levels since her Depakote is being increased today.  Follow-up in clinic in 4 weeks or sooner if needed.  I have spent atleast 25 minutes face to face with patient today. More than 50 % of the time was spent for psychoeducation and supportive psychotherapy and care coordination.  This note was generated in part  or whole with voice recognition software. Voice recognition is usually quite accurate but there are transcription errors that can and very often do occur. I apologize for any typographical errors that were not detected and corrected.      Ursula Alert, MD 01/14/2019, 5:23 PM

## 2019-01-16 ENCOUNTER — Ambulatory Visit (INDEPENDENT_AMBULATORY_CARE_PROVIDER_SITE_OTHER): Admitting: Adult Health

## 2019-01-16 ENCOUNTER — Other Ambulatory Visit: Payer: Self-pay

## 2019-01-16 ENCOUNTER — Encounter: Payer: Self-pay | Admitting: Adult Health

## 2019-01-16 VITALS — BP 146/92 | HR 101 | Resp 16 | Ht 67.0 in | Wt 281.0 lb

## 2019-01-16 DIAGNOSIS — Z6841 Body Mass Index (BMI) 40.0 and over, adult: Secondary | ICD-10-CM | POA: Diagnosis not present

## 2019-01-16 DIAGNOSIS — E781 Pure hyperglyceridemia: Secondary | ICD-10-CM

## 2019-01-16 DIAGNOSIS — F17219 Nicotine dependence, cigarettes, with unspecified nicotine-induced disorders: Secondary | ICD-10-CM | POA: Diagnosis not present

## 2019-01-16 NOTE — Progress Notes (Signed)
[facility] [place], [place] [address]  Internal MEDICINE  Office Visit Note  Patient Name: Gloria Garza  [id number]  [id number]  Date of Service: 01/16/2019  Chief Complaint  Patient presents with  . Medical Management of Chronic Issues    Physchiatrist had recommended to follow up with primary about reviewing the labs that was done     HPI  Patient is here for follow-up.  Patient states her psychiatrist recommend she follow-up with her PCP due to some lab values.  She reports that she cannot remember which lab values the psychiatrist was concerned about.  However looking at her most recent results her total cholesterol is 200 and her LDL is 100 and her glycerides are 262.  We discussed this in the visit today.   Current Medication: Outpatient Encounter Medications as of 01/16/2019  Medication Sig  . clonazePAM (KLONOPIN) 0.5 MG tablet Take 1 tablet (0.5 mg total) by mouth as directed. DAILY ONCE AS NEEDED PRIOR TO GETTING ON FLIGHT  . divalproex (DEPAKOTE ER) 500 MG 24 hr tablet Take 2 tablets (1,000 mg total) by mouth daily with supper.  . traZODone (DESYREL) 100 MG tablet Take 1 tablet (100 mg total) by mouth at bedtime. SLEEP  . [DISCONTINUED] azithromycin (ZITHROMAX) 250 MG tablet Take as directed. (Patient not taking: Reported on 01/16/2019)  . [DISCONTINUED] promethazine-codeine (PHENERGAN WITH CODEINE) 6.25-10 MG/5ML syrup Take 5 mLs by mouth every 6 (six) hours as needed for cough. (Patient not taking: Reported on 01/16/2019)   No facility-administered encounter medications on file as of 01/16/2019.     Surgical History: Past Surgical History:  Procedure Laterality Date  . CESAREAN SECTION    . TUBAL LIGATION      Medical History: Past Medical History:  Diagnosis Date  . Anxiety   . Bipolar disorder ([place])   . Depression   . Gestational diabetes     Family History: Family History  Problem Relation Age of Onset  . Diabetes Maternal  Grandmother   . Cancer - Colon Maternal Grandmother   . Diabetes Paternal Grandmother   . Heart disease Paternal Grandmother   . Alcohol abuse Brother   . Drug abuse Brother     Social History   Socioeconomic History  . Marital status: Married    Spouse name: terah  . Number of children: 2  . Years of education: Not on file  . Highest education level: Associate degree: occupational, Hotel manager, or vocational program  Occupational History  . Not on file  Social Needs  . Financial resource strain: Not hard at all  . Food insecurity:    Worry: Never true    Inability: Never true  . Transportation needs:    Medical: No    Non-medical: No  Tobacco Use  . Smoking status: Former Smoker    Types: Cigarettes    Last attempt to quit: 07/18/2018    Years since quitting: 0.4  . Smokeless tobacco: Never Used  Substance and Sexual Activity  . Alcohol use: Not Currently    Comment: occasional   . Drug use: Never  . Sexual activity: Yes  Lifestyle  . Physical activity:    Days per week: 0 days    Minutes per session: 0 min  . Stress: Very much  Relationships  . Social connections:    Talks on phone: Not on file    Gets together: Not on file    Attends religious service: Never    Active member of  club or organization: No    Attends meetings of clubs or organizations: Never    Relationship status: Married  . Intimate partner violence:    Fear of current or ex partner: No    Emotionally abused: No    Physically abused: No    Forced sexual activity: No  Other Topics Concern  . Not on file  Social History Narrative  . Not on file      Review of Systems  Constitutional: Negative for chills, fatigue and unexpected weight change.  HENT: Negative for congestion, rhinorrhea, sneezing and sore throat.   Eyes: Negative for photophobia, pain and redness.  Respiratory: Negative for cough, chest tightness and shortness of breath.   Cardiovascular: Negative for chest pain and  palpitations.  Gastrointestinal: Negative for abdominal pain, constipation, diarrhea, nausea and vomiting.  Endocrine: Negative.   Genitourinary: Negative for dysuria and frequency.  Musculoskeletal: Negative for arthralgias, back pain, joint swelling and neck pain.  Skin: Negative for rash.  Allergic/Immunologic: Negative.   Neurological: Negative for tremors and numbness.  Hematological: Negative for adenopathy. Does not bruise/bleed easily.  Psychiatric/Behavioral: Negative for behavioral problems and sleep disturbance. The patient is not nervous/anxious.     Vital Signs: BP (!) 146/92 (BP Location: Left Arm, Patient Position: Sitting, Cuff Size: Normal)   Pulse (!) 101   Resp 16   Ht 5\' 7"  (1.702 m)   Wt 281 lb (127.5 kg)   SpO2 97%   BMI 44.01 kg/m    Physical Exam Vitals signs and nursing note reviewed.  Constitutional:      General: She is not in acute distress.    Appearance: She is well-developed. She is not diaphoretic.  HENT:     Head: Normocephalic and atraumatic.     Mouth/Throat:     Pharynx: No oropharyngeal exudate.  Eyes:     Pupils: Pupils are equal, round, and reactive to light.  Neck:     Musculoskeletal: Normal range of motion and neck supple.     Thyroid: No thyromegaly.     Vascular: No JVD.     Trachea: No tracheal deviation.  Cardiovascular:     Rate and Rhythm: Normal rate and regular rhythm.     Heart sounds: Normal heart sounds. No murmur. No friction rub. No gallop.   Pulmonary:     Effort: Pulmonary effort is normal. No respiratory distress.     Breath sounds: Normal breath sounds. No wheezing or rales.  Chest:     Chest wall: No tenderness.  Abdominal:     Palpations: Abdomen is soft.     Tenderness: There is no abdominal tenderness. There is no guarding.  Musculoskeletal: Normal range of motion.  Lymphadenopathy:     Cervical: No cervical adenopathy.  Skin:    General: Skin is warm and dry.  Neurological:     Mental Status: She  is alert and oriented to person, place, and time.     Cranial Nerves: No cranial nerve deficit.  Psychiatric:        Behavior: Behavior normal.        Thought Content: Thought content normal.        Judgment: Judgment normal.    Assessment/Plan: 1. Hypertriglyceridemia Patient made aware that her triglyceride level is high as well as some other cholesterol levels.  Reports that she is starting a new diet and exercise routine with wife.  She does not wish to go on any medication at this time and would like to take  the opportunity to diet and exercise and see if she can improve her cholesterol.  I have ordered a new lipid panel with an LDL HDL ratio which she will do before she comes back for her next appointment.  In the meantime I have instructed her that if she has any issues before that visit that she should return to the office. - Lipid Panel With LDL/HDL Ratio  2. Class 3 severe obesity due to excess calories with serious comorbidity and body mass index (BMI) of 40.0 to 44.9 in adult [facility]) Obesity Counseling: Risk Assessment: An assessment of behavioral risk factors was made today and includes lack of exercise sedentary lifestyle, lack of portion control and poor dietary habits.  Risk Modification Advice: She was counseled on portion control guidelines. Restricting daily caloric intake to. . The detrimental long term effects of obesity on her health and ongoing poor compliance was also discussed with the patient.   3. Cigarette nicotine dependence with nicotine-induced disorder Smoking cessation counseling: 1. Pt acknowledges the risks of long term smoking, she will try to quite smoking. 2. Options for different medications including nicotine products, chewing gum, patch etc, Wellbutrin and Chantix is discussed 3. Goal and date of compete cessation is discussed 4. Total time spent in smoking cessation is 15 min.   General Counseling: Toniann verbalizes understanding of the findings of  todays visit and agrees with plan of treatment. I have discussed any further diagnostic evaluation that may be needed or ordered today. We also reviewed her medications today. she has been encouraged to call the office with any questions or concerns that should arise related to todays visit.    No orders of the defined types were placed in this encounter.   No orders of the defined types were placed in this encounter.   Time spent: 25 Minutes   This patient was seen by Orson Gear AGNP-C in Collaboration with Dr Lavera Guise as a part of collaborative care agreement     Kendell Bane AGNP-C Internal medicine

## 2019-01-16 NOTE — Patient Instructions (Signed)
Triglycerides Test Why am I having this test? Triglycerides are a type of fat in the body. Having a high level of triglycerides can increase your risk for heart disease. You may have this test as part of a routine physical exam. Health care providers recommend that adults have this test at least once every 5 years. If you have risk factors for heart disease or are being treated for high triglycerides, you may need to have this test more often. What is being tested? This test measures the amount of triglycerides in your blood. Triglycerides are naturally present in the body, and you also take in triglycerides by eating certain foods. Triglycerides may be measured as part of a test called a lipid profile, which tests triglycerides and cholesterol levels. What kind of sample is taken?     A blood sample is required for this test. It may be collected by inserting a needle into a blood vessel, or by pricking a fingertip with a small needle (finger stick). How do I prepare for this test?  Follow instructions from your health care provider about changing or stopping your regular medicines.  Do not eat or drink anything except water starting 9-12 hours before your test, or as long as told by your health care provider.  Do not drink alcohol starting at least 24 hours before your test.  Follow any instructions from your health care provider about dietary restrictions before your test. Tell a health care provider about:  All medicines you are taking, including vitamins, herbs, eye drops, creams, and over-the-counter medicines.  Any blood disorders you have.  Any medical conditions you have. How are the results reported? Your test results will be reported as a value that indicates how many triglycerides are in your blood. This will be given as milligrams of triglycerides per deciliter of blood (mg/dL). Your health care provider will compare your results to normal values that were established after  testing a large group of people (reference ranges). Reference ranges may vary among labs and hospitals. For this test, common reference ranges are:  Adults: ? Female: 40-160 mg/dL or 0.45-1.81 mmol/L (SI units). ? Female: 35-135 mg/dL or 0.40-1.52 mmol/L (SI units).  Teens 71-38 years old: ? Female: 40-163 mg/dL. ? Female: 40-128 mg/dL.  Children 50-22 years old: ? Female: 36-138 mg/dL. ? Female: 41-138 mg/dL.  Children 71-21 years old: ? Female: 31-108 mg/dL. ? Female: 35-114 mg/dL.  Children 5 years or younger: ? Female: 30-86 mg/dL. ? Female: 32-99 mg/dL. What do the results mean? Results that are within the reference range are considered normal. This means that you have a normal amount of triglycerides in your blood. Results that are higher than your reference range mean that there are too many triglycerides in your blood. This may mean that you:  Have a higher risk of heart disease.  Have certain diseases that cause high triglycerides, such as diabetes.  Are taking certain medicines such as estrogens and oral contraceptives. Results that are lower than your reference range mean that there are too few triglycerides in your blood. This may mean that you are not getting enough nutrients in your diet (malnutrition). Talk with your health care provider about what your results mean. Questions to ask your health care provider Ask your health care provider, or the department that is doing the test:  When will my results be ready?  How will I get my results?  What are my treatment options?  What other tests do I need?  What are my next steps? Summary  Triglycerides are a type of fat in the body. Having a high level of triglycerides can increase your risk for heart disease.  You may have this test as part of a routine physical exam. Triglycerides may be measured as part of a test called a lipid profile, which tests triglycerides and cholesterol.  Talk with your health care provider  about what your results mean. This information is not intended to replace advice given to you by your health care provider. Make sure you discuss any questions you have with your health care provider. Document Released: 12/10/2004 Document Revised: 08/08/2017 Document Reviewed: 08/08/2017 Elsevier Interactive Patient Education  2019 [employer].

## 2019-01-17 ENCOUNTER — Ambulatory Visit: Payer: Self-pay | Admitting: Adult Health

## 2019-01-23 ENCOUNTER — Ambulatory Visit: Admitting: Adult Health

## 2019-02-01 ENCOUNTER — Other Ambulatory Visit: Payer: Self-pay | Admitting: Psychiatry

## 2019-02-02 LAB — VALPROIC ACID LEVEL: Valproic Acid Lvl: 49 ug/mL — ABNORMAL LOW (ref 50–100)

## 2019-02-03 ENCOUNTER — Encounter: Payer: Self-pay | Admitting: Adult Health

## 2019-02-06 ENCOUNTER — Other Ambulatory Visit: Payer: Self-pay

## 2019-02-06 ENCOUNTER — Ambulatory Visit (INDEPENDENT_AMBULATORY_CARE_PROVIDER_SITE_OTHER): Admitting: Licensed Clinical Social Worker

## 2019-02-06 DIAGNOSIS — F3162 Bipolar disorder, current episode mixed, moderate: Secondary | ICD-10-CM | POA: Diagnosis not present

## 2019-02-06 DIAGNOSIS — F411 Generalized anxiety disorder: Secondary | ICD-10-CM | POA: Diagnosis not present
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
## 2019-02-12 ENCOUNTER — Ambulatory Visit (INDEPENDENT_AMBULATORY_CARE_PROVIDER_SITE_OTHER): Admitting: Psychiatry

## 2019-02-12 ENCOUNTER — Other Ambulatory Visit: Payer: Self-pay

## 2019-02-12 ENCOUNTER — Encounter: Payer: Self-pay | Admitting: Psychiatry

## 2019-02-12 DIAGNOSIS — G4700 Insomnia, unspecified: Secondary | ICD-10-CM | POA: Diagnosis not present

## 2019-02-12 DIAGNOSIS — F411 Generalized anxiety disorder: Secondary | ICD-10-CM

## 2019-02-12 DIAGNOSIS — Z634 Disappearance and death of family member: Secondary | ICD-10-CM

## 2019-02-12 DIAGNOSIS — F40298 Other specified phobia: Secondary | ICD-10-CM

## 2019-02-12 DIAGNOSIS — F3162 Bipolar disorder, current episode mixed, moderate: Secondary | ICD-10-CM | POA: Diagnosis not present
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
## 2019-03-04 ENCOUNTER — Ambulatory Visit (INDEPENDENT_AMBULATORY_CARE_PROVIDER_SITE_OTHER): Admitting: Licensed Clinical Social Worker

## 2019-03-04 ENCOUNTER — Other Ambulatory Visit: Payer: Self-pay

## 2019-03-04 DIAGNOSIS — F3162 Bipolar disorder, current episode mixed, moderate: Secondary | ICD-10-CM

## 2019-03-13 ENCOUNTER — Telehealth: Payer: Self-pay | Admitting: Psychiatry

## 2019-03-13 ENCOUNTER — Telehealth: Payer: Self-pay

## 2019-03-13 NOTE — Telephone Encounter (Signed)
Patient has enough meds to last until next appointment.

## 2019-03-13 NOTE — Telephone Encounter (Signed)
pt called left message that she need enough medication send into pharmacy to get to her next appt next month.

## 2019-03-13 NOTE — Telephone Encounter (Signed)
Pls ask patient what she wants - looks like she has enough of everything,pls clarify.thnks

## 2019-03-14 ENCOUNTER — Telehealth: Payer: Self-pay | Admitting: Psychiatry

## 2019-03-14 DIAGNOSIS — G4700 Insomnia, unspecified: Secondary | ICD-10-CM

## 2019-03-14 MED ORDER — TRAZODONE HCL 100 MG PO TABS: 200.0000 mg | ORAL_TABLET | Freq: Every day | ORAL | 0 refills | Status: DC

## 2019-03-14 NOTE — Telephone Encounter (Signed)
Pt needs refills on her trazodone pt has appt for next month.    traZODone (DESYREL) 100 MG tablet  Medication  Date: 01/14/2019 Department: [facility] Ordering/Authorizing: Ursula Alert, MD  Order Providers   Prescribing Provider Encounter Provider  Ursula Alert, MD Ursula Alert, MD  Outpatient Medication Detail    Disp Refills Start End   traZODone (DESYREL) 100 MG tablet 90 tablet 0 01/14/2019    Sig - Route: Take 1 tablet (100 mg total) by mouth at bedtime. SLEEP - Oral   Sent to pharmacy as: traZODone (DESYREL) 100 MG tablet   E-Prescribing Status: Receipt confirmed by pharmacy (01/14/2019 3:55 PM EST)

## 2019-03-14 NOTE — Telephone Encounter (Signed)
Pt states she ran out of trazodone early since she started taking 2 tablets. Will send new script to pharmacy.

## 2019-03-26 ENCOUNTER — Ambulatory Visit (INDEPENDENT_AMBULATORY_CARE_PROVIDER_SITE_OTHER): Admitting: Psychiatry

## 2019-03-26 ENCOUNTER — Encounter: Payer: Self-pay | Admitting: Psychiatry

## 2019-03-26 ENCOUNTER — Other Ambulatory Visit: Payer: Self-pay

## 2019-03-26 DIAGNOSIS — F411 Generalized anxiety disorder: Secondary | ICD-10-CM | POA: Diagnosis not present

## 2019-03-26 DIAGNOSIS — F5101 Primary insomnia: Secondary | ICD-10-CM | POA: Diagnosis not present

## 2019-03-26 DIAGNOSIS — F17200 Nicotine dependence, unspecified, uncomplicated: Secondary | ICD-10-CM | POA: Diagnosis not present

## 2019-03-26 DIAGNOSIS — F3162 Bipolar disorder, current episode mixed, moderate: Secondary | ICD-10-CM | POA: Diagnosis not present

## 2019-03-26 DIAGNOSIS — F40298 Other specified phobia: Secondary | ICD-10-CM

## 2019-03-26 MED ORDER — DIVALPROEX SODIUM ER 250 MG PO TB24: 250.0000 mg | ORAL_TABLET | Freq: Every day | ORAL | 0 refills | Status: DC

## 2019-03-26 MED ORDER — DIVALPROEX SODIUM ER 500 MG PO TB24: 1000.0000 mg | ORAL_TABLET | Freq: Every day | ORAL | 0 refills | Status: DC

## 2019-03-26 NOTE — Progress Notes (Signed)
Virtual Visit via Video Note  I connected with Gloria Garza on 03/26/19 at  2:00 PM EDT by a video enabled telemedicine application and verified that I am speaking with the correct person using two identifiers.   I discussed the limitations of evaluation and management by telemedicine and the availability of in person appointments. The patient expressed understanding and agreed to proceed.    I discussed the assessment and treatment plan with the patient. The patient was provided an opportunity to ask questions and all were answered. The patient agreed with the plan and demonstrated an understanding of the instructions.   The patient was advised to call back or seek an in-person evaluation if the symptoms worsen or if the condition fails to improve as anticipated.   [place] MD  OP Progress Note  03/26/2019 2:25 PM Gloria Garza  MRN:  [id number]  Chief Complaint:  Chief Complaint    Follow-up     HPI: Gloria Garza is a 38 year old female, lives in [place], married, has a history of bipolar disorder, GAD, insomnia, was evaluated by telemedicine today.  Patient today reports she is currently struggling with some anxiety symptoms.  She reports it could be due to the COVID-19 outbreak.  She reports her children are currently being homeschooled.  She reports she is taking Depakote as prescribed during the day.  She reports it helps to some extent.  She is interested in a dosage increase.  Her Depakote levels were checked on 02/01/2019 and at that time it was 49-borderline therapeutic.  Patient reports she is sleeping okay so far.  She does struggle with going to bed early.  She reports she has been this way all her life.  She continues to work on her sleep hygiene.  Patient denies any suicidality, homicidality or perceptual disturbances.  She reports she looks forward to her appointment with Ms. Leontine Locket which is coming up.  She reports her her cousin's death anniversary is coming up in June and she  definitely wants to talk to her therapist about the same.  Patient denies any other concerns today.  Pt continues to smoke cigarettes and is not ready to quit. Visit Diagnosis:    ICD-10-CM   1. Bipolar 1 disorder, mixed, moderate (HCC) F31.62 divalproex (DEPAKOTE ER) 250 MG 24 hr tablet    divalproex (DEPAKOTE ER) 500 MG 24 hr tablet  2. GAD (generalized anxiety disorder) F41.1   3. Primary insomnia F51.01   4. Tobacco use disorder F17.200   5. 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  Past trials of Zoloft.  Past Medical History:  Past Medical History:  Diagnosis Date  . Anxiety   . Bipolar disorder ([place])   . Depression   . Gestational diabetes     Past Surgical History:  Procedure Laterality Date  . CESAREAN SECTION    . TUBAL LIGATION      Family Psychiatric History: Reviewed family psychiatric history from my progress note on 12/18/2018.  Family History:  Family History  Problem Relation Age of Onset  . Diabetes Maternal Grandmother   . Cancer - Colon Maternal Grandmother   . Diabetes Paternal Grandmother   . Heart disease Paternal Grandmother   . Alcohol abuse Brother   . Drug abuse Brother     Social History: Reviewed social history from my progress note on 12/18/2018 Social History   Socioeconomic History  .  Marital status: Married    Spouse name: terah  . Number of children: 2  . Years of education: Not on file  . Highest education level: Associate degree: occupational, Hotel manager, or vocational program  Occupational History  . Not on file  Social Needs  . Financial resource strain: Not hard at all  . Food insecurity:    Worry: Never true    Inability: Never true  . Transportation needs:    Medical: No    Non-medical: No  Tobacco Use  . Smoking status: Former Smoker    Types: Cigarettes    Last attempt to quit: 07/18/2018    Years since  quitting: 0.6  . Smokeless tobacco: Never Used  Substance and Sexual Activity  . Alcohol use: Not Currently    Comment: occasional   . Drug use: Never  . Sexual activity: Yes  Lifestyle  . Physical activity:    Days per week: 0 days    Minutes per session: 0 min  . Stress: Very much  Relationships  . Social connections:    Talks on phone: Not on file    Gets together: Not on file    Attends religious service: Never    Active member of club or organization: No    Attends meetings of clubs or organizations: Never    Relationship status: Married  Other Topics Concern  . Not on file  Social History Narrative  . Not on file    Allergies: No Known Allergies  Metabolic Disorder Labs: Lab Results  Component Value Date   HGBA1C 6.0 (H) 12/31/2018   Lab Results  Component Value Date   PROLACTIN 14.9 12/31/2018   Lab Results  Component Value Date   CHOL 200 (H) 12/31/2018   TRIG 262 (H) 12/31/2018   HDL 48 12/31/2018   LDLCALC 100 (H) 12/31/2018   Lab Results  Component Value Date   TSH 1.340 10/29/2018    Therapeutic Level Labs: No results found for: LITHIUM Lab Results  Component Value Date   VALPROATE 49 (L) 02/01/2019   VALPROATE 22 (L) 12/31/2018   No components found for:  CBMZ  Current Medications: Current Outpatient Medications  Medication Sig Dispense Refill  . clonazePAM (KLONOPIN) 0.5 MG tablet Take 1 tablet (0.5 mg total) by mouth as directed. DAILY ONCE AS NEEDED PRIOR TO GETTING ON FLIGHT 5 tablet 0  . divalproex (DEPAKOTE ER) 250 MG 24 hr tablet Take 1 tablet (250 mg total) by mouth daily. To be combined with 1000 mg to make 1250 90 tablet 0  . divalproex (DEPAKOTE ER) 500 MG 24 hr tablet Take 2 tablets (1,000 mg total) by mouth daily. 180 tablet 0  . meloxicam (MOBIC) 7.5 MG tablet     . traZODone (DESYREL) 100 MG tablet Take 2 tablets (200 mg total) by mouth at bedtime. SLEEP 180 tablet 0   No current facility-administered medications for this  visit.      Musculoskeletal: Strength & Muscle Tone: UTA Gait & Station: normal Patient leans: N/A  Psychiatric Specialty Exam: Review of Systems  Psychiatric/Behavioral: The patient is nervous/anxious.   All other systems reviewed and are negative.   There were no vitals taken for this visit.There is no height or weight on file to calculate BMI.  General Appearance: Casual  Eye Contact:  Fair  Speech:  Clear and Coherent  Volume:  Normal  Mood:  Anxious  Affect:  Appropriate  Thought Process:  Goal Directed and Descriptions of Associations: Intact  Orientation:  Full (Time, Place, and Person)  Thought Content: Logical   Suicidal Thoughts:  No  Homicidal Thoughts:  No  Memory:  Immediate;   Fair Recent;   Fair Remote;   Fair  Judgement:  Fair  Insight:  Fair  Psychomotor Activity:  Normal  Concentration:  Concentration: Fair and Attention Span: Fair  Recall:  [employer] of Knowledge: Fair  Language: Fair  Akathisia:  No  Handed:  Right  AIMS (if indicated): denies tremors, rigidity,stiffness  Assets:  Communication Skills Desire for Improvement Housing Social Support  ADL's:  Intact  Cognition: WNL  Sleep:  improving   Screenings: PHQ2-9     Office Visit from 01/16/2019 in [facility] [place] from 12/20/2018 in [facility], [facility] Office Visit from 09/17/2018 in [facility], [facility]  PHQ-2 Total Score  0  1  0       Assessment and Plan: Gloria Garza is a 38 year old female who has a history of bipolar disorder, anxiety disorder was evaluated by telemedicine today.  Patient is biologically predisposed given her history of trauma.  She also has psychosocial stressors of several deaths in the family as well as the current COVID-19 outbreak.  Patient continues to need medication readjustment due to mood lability and anxiety.  Plan as noted below.  Plan Bipolar disorder-unstable Increase Depakote ER to 1250 mg p.o. daily. Last  Depakote level on 02/01/2019 20-49-borderline therapeutic. Will order labs again-Depakote levels.Will mail lab slip to her today.  For insomnia-improving She will continue to work on sleep hygiene. Trazodone 100 mg p.o. nightly  Generalized anxiety disorder- some progress She will continue to need psychotherapy sessions.  Bereavement- improving Patient will benefit from psychotherapy sessions since the anniversary of her cousin's death is coming up.  For specific phobia-making progress She will continue therapy sessions.  Tobacco use disorder-unstable Provided smoking cessation counseling.  She is not ready to quit.  Follow-up in clinic in 3 to 4 weeks or sooner if needed.  I have spent atleast 25 minutes non face to face with patient today. More than 50 % of the time was spent for psychoeducation and supportive psychotherapy and care coordination.  This note was generated in part or whole with voice recognition software. Voice recognition is usually quite accurate but there are transcription errors that can and very often do occur. I apologize for any typographical errors that were not detected and corrected.          Ursula Alert, MD 03/26/2019, 2:25 PM

## 2019-04-01 ENCOUNTER — Ambulatory Visit (INDEPENDENT_AMBULATORY_CARE_PROVIDER_SITE_OTHER): Admitting: Licensed Clinical Social Worker

## 2019-04-01 ENCOUNTER — Other Ambulatory Visit: Payer: Self-pay

## 2019-04-01 DIAGNOSIS — F3162 Bipolar disorder, current episode mixed, moderate: Secondary | ICD-10-CM | POA: Diagnosis not present

## 2019-04-09 NOTE — Telephone Encounter (Signed)
traZODone (DESYREL) 100 MG tablet  Medication  Date: 03/14/2019 Department: [facility] Ordering/Authorizing: Ursula Alert, MD  Order Providers   Prescribing Provider Encounter Provider  Ursula Alert, MD Ursula Alert, MD  Outpatient Medication Detail    Disp Refills Start End   traZODone (DESYREL) 100 MG tablet 180 tablet 0 03/14/2019    Sig - Route: Take 2 tablets (200 mg total) by mouth at bedtime. SLEEP - Oral   Sent to pharmacy as: traZODone (DESYREL) 100 MG tablet   E-Prescribing Status: Receipt confirmed by pharmacy (03/14/2019 12:37 PM EDT)

## 2019-04-09 NOTE — Progress Notes (Signed)
Virtual Visit via Video Note  I connected with [name] on 04/09/19 at  2:00 PM EDT by a video enabled telemedicine application and verified that I am speaking with the correct person using two identifiers.  Location: Patient: Home Provider: [facility]   I discussed the limitations of evaluation and management by telemedicine and the availability of in person appointments. The patient expressed understanding and agreed to proceed.   Summary: Gloria Garza is a 38 y.o. female who presents with symptoms of her diagnosis.  Therapist met with Patient in an initial therapy session to assess current mood and to build rapport. Therapist engaged Patient in discussion about her life and what is going well for her. Therapist provided support for Patient as she shared details about her life, her current stressors, mood, coping skills, her past, and her children. Therapist prompted Patient to discuss her support system and ways that she manages her daily stress, anger, and frustrations.   LCSW discussed what psychotherapy is and is not and the importance of the therapeutic relationship to include open and honest communication between client and therapist and building trust.  Reviewed advantages and disadvantages of the therapeutic process and limitations to the therapeutic relationship including LCSW's role in maintaining the safety of the client, others and those in client's care.    Suicidal/Homicidal: No  Plan: Return again in 2 weeks.  Diagnosis: Axis I: Generalized Anxiety Disorder    Axis II: No diagnosis    The patient was provided an opportunity to ask questions and all were answered. The patient agreed with the plan and demonstrated an understanding of the instructions.   The patient was advised to call back or seek an in-person evaluation if the symptoms worsen or if the condition fails to improve as anticipated.  I provided 30 minutes of non-face-to-face time during this encounter  Lubertha South, LCSW

## 2019-04-16 ENCOUNTER — Other Ambulatory Visit: Payer: Self-pay | Admitting: Psychiatry

## 2019-04-17 ENCOUNTER — Ambulatory Visit: Admitting: Licensed Clinical Social Worker

## 2019-04-17 ENCOUNTER — Other Ambulatory Visit: Payer: Self-pay

## 2019-04-17 DIAGNOSIS — F3162 Bipolar disorder, current episode mixed, moderate: Secondary | ICD-10-CM

## 2019-04-17 LAB — VALPROIC ACID LEVEL: Valproic Acid Lvl: 30 ug/mL — ABNORMAL LOW (ref 50–100)

## 2019-04-18 NOTE — Progress Notes (Signed)
Virtual Visit via Video Note  I connected with Gloria Garza on 04/01/19 at  1:00 PM EDT by a video enabled telemedicine application and verified that I am speaking with the correct person using two identifiers.  Location: Patient: home Provider: office   I discussed the limitations of evaluation and management by telemedicine and the availability of in person appointments. The patient expressed understanding and agreed to proceed.   Participation Level: Active   Type of Therapy: Individual Therapy  Treatment Goals addressed: Coping  Interventions: CBT and Motivational Interviewing  Summary: Gloria Garza is a 38 y.o. female who presents with continued symptoms.  Therapist did a brief mood check, assessing anger, fear, disgust, excitement, happiness, and sadness. Therapist provided active listening for Patient as she communicated her current stressors.  Therapist attempted to normalize her feelings about her current relationship and struggles. Therapist discussed with Patient how to manage her symptoms.  Therapist explained what managing symptoms mean and how to prevent relapse.  Discussion of coping with stress, building social support, develop a relapse prevention plan and the importance of medication compliance. Reviewed in detail each step.  Assisted Patient with comprehension by providing examples of each step and allowing Patient to make a plan to successful utilize each step.   Suicidal/Homicidal: No   Plan: Return again in 2 weeks.  Diagnosis: Axis I: Bipolar    Axis II: No diagnosis   I discussed the assessment and treatment plan with the patient. The patient was provided an opportunity to ask questions and all were answered. The patient agreed with the plan and demonstrated an understanding of the instructions.   The patient was advised to call back or seek an in-person evaluation if the symptoms worsen or if the condition fails to improve as anticipated.  I provided 30  minutes of non-face-to-face time during this encounter.   Lubertha South, LCSW

## 2019-04-22 ENCOUNTER — Other Ambulatory Visit: Payer: Self-pay

## 2019-04-22 ENCOUNTER — Ambulatory Visit (INDEPENDENT_AMBULATORY_CARE_PROVIDER_SITE_OTHER): Admitting: Psychiatry

## 2019-04-22 ENCOUNTER — Encounter: Payer: Self-pay | Admitting: Psychiatry

## 2019-04-22 DIAGNOSIS — F5101 Primary insomnia: Secondary | ICD-10-CM | POA: Diagnosis not present

## 2019-04-22 DIAGNOSIS — F3162 Bipolar disorder, current episode mixed, moderate: Secondary | ICD-10-CM

## 2019-04-22 DIAGNOSIS — F411 Generalized anxiety disorder: Secondary | ICD-10-CM

## 2019-04-22 DIAGNOSIS — F40298 Other specified phobia: Secondary | ICD-10-CM

## 2019-04-22 DIAGNOSIS — F17200 Nicotine dependence, unspecified, uncomplicated: Secondary | ICD-10-CM | POA: Diagnosis not present

## 2019-04-22 MED ORDER — DIVALPROEX SODIUM ER 500 MG PO TB24: 1500.0000 mg | ORAL_TABLET | Freq: Every day | ORAL | 0 refills | Status: DC

## 2019-04-22 NOTE — Progress Notes (Deleted)
Patient  was late

## 2019-04-22 NOTE — Progress Notes (Signed)
Virtual Visit via Video Note  I connected with Gloria Garza on 04/22/19 at  2:30 PM EDT by a video enabled telemedicine application and verified that I am speaking with the correct person using two identifiers.   I discussed the limitations of evaluation and management by telemedicine and the availability of in person appointments. The patient expressed understanding and agreed to proceed.   I discussed the assessment and treatment plan with the patient. The patient was provided an opportunity to ask questions and all were answered. The patient agreed with the plan and demonstrated an understanding of the instructions.   The patient was advised to call back or seek an in-person evaluation if the symptoms worsen or if the condition fails to improve as anticipated.   [place] MD OP Progress Note  04/22/2019 5:18 PM Gloria Garza  MRN:  [id number]  Chief Complaint:  Chief Complaint    Follow-up     HPI: Gloria Garza is a 38 year old Caucasian female, has a history of bipolar disorder, lives in [place], married, was evaluated by telemedicine today.  Patient today reports she forgot all about the appointment and hence presented late.  Patient reports she recently had a hypomanic or manic episode couple of days ago.  She reports she felt hyperactive, energetic and did a lot of things that she would normally would not do.  She reports after that she crashed and slept for 2 days straight.  Patient reports she continues to be compliant with her medications as prescribed.  Discussed her most recent Depakote level dated 04/16/2019.  Discussed with patient that her Depakote level came back subtherapeutic at 30.  She reports she is compliant and has not missed any dosages.  Discussed readjusting her dosage today.  However discussed with her that if she continues to struggle with mood symptoms to call back the clinic to make further medication readjustment.  Patient reports she continues to struggle with some anxiety  symptoms on and off more so because of the current pandemic.  She speaks to her wife who is very supportive.  Patient denies any suicidality, homicidality or perceptual disturbances.     Visit Diagnosis:    ICD-10-CM   1. Bipolar 1 disorder, mixed, moderate (HCC) F31.62 divalproex (DEPAKOTE ER) 500 MG 24 hr tablet  2. GAD (generalized anxiety disorder) F41.1   3. Primary insomnia F51.01   4. Tobacco use disorder F17.200   5. 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  Past trials of Zoloft.  Past Medical History:  Past Medical History:  Diagnosis Date  . Anxiety   . Bipolar disorder ([place])   . Depression   . Gestational diabetes     Past Surgical History:  Procedure Laterality Date  . CESAREAN SECTION    . TUBAL LIGATION      Family Psychiatric History: Reviewed family psychiatric history from my progress note on 12/18/2018. Family History:  Family History  Problem Relation Age of Onset  . Diabetes Maternal Grandmother   . Cancer - Colon Maternal Grandmother   . Diabetes Paternal Grandmother   . Heart disease Paternal Grandmother   . Alcohol abuse Brother   . Drug abuse Brother     Social History: Reviewed social history from my progress note on 12/18/2018. Social History   Socioeconomic History  . Marital status: Married    Spouse name: terah  . Number of children: 2  .  Years of education: Not on file  . Highest education level: Associate degree: occupational, Hotel manager, or vocational program  Occupational History  . Not on file  Social Needs  . Financial resource strain: Not hard at all  . Food insecurity:    Worry: Never true    Inability: Never true  . Transportation needs:    Medical: No    Non-medical: No  Tobacco Use  . Smoking status: Former Smoker    Types: Cigarettes    Last attempt to quit: 07/18/2018    Years since quitting: 0.7  . Smokeless  tobacco: Never Used  Substance and Sexual Activity  . Alcohol use: Not Currently    Comment: occasional   . Drug use: Never  . Sexual activity: Yes  Lifestyle  . Physical activity:    Days per week: 0 days    Minutes per session: 0 min  . Stress: Very much  Relationships  . Social connections:    Talks on phone: Not on file    Gets together: Not on file    Attends religious service: Never    Active member of club or organization: No    Attends meetings of clubs or organizations: Never    Relationship status: Married  Other Topics Concern  . Not on file  Social History Narrative  . Not on file    Allergies: No Known Allergies  Metabolic Disorder Labs: Lab Results  Component Value Date   HGBA1C 6.0 (H) 12/31/2018   Lab Results  Component Value Date   PROLACTIN 14.9 12/31/2018   Lab Results  Component Value Date   CHOL 200 (H) 12/31/2018   TRIG 262 (H) 12/31/2018   HDL 48 12/31/2018   LDLCALC 100 (H) 12/31/2018   Lab Results  Component Value Date   TSH 1.340 10/29/2018    Therapeutic Level Labs: No results found for: LITHIUM Lab Results  Component Value Date   VALPROATE 30 (L) 04/16/2019   VALPROATE 49 (L) 02/01/2019   No components found for:  CBMZ  Current Medications: Current Outpatient Medications  Medication Sig Dispense Refill  . amoxicillin (AMOXIL) 500 MG capsule     . clonazePAM (KLONOPIN) 0.5 MG tablet Take 1 tablet (0.5 mg total) by mouth as directed. DAILY ONCE AS NEEDED PRIOR TO GETTING ON FLIGHT 5 tablet 0  . divalproex (DEPAKOTE ER) 500 MG 24 hr tablet Take 3 tablets (1,500 mg total) by mouth daily. 270 tablet 0  . meloxicam (MOBIC) 7.5 MG tablet     . traZODone (DESYREL) 100 MG tablet Take 2 tablets (200 mg total) by mouth at bedtime. SLEEP 180 tablet 0   No current facility-administered medications for this visit.      Musculoskeletal: Strength & Muscle Tone: within normal limits Gait & Station: normal Patient leans:  N/A  Psychiatric Specialty Exam: Review of Systems  Psychiatric/Behavioral: The patient is nervous/anxious.   All other systems reviewed and are negative.   There were no vitals taken for this visit.There is no height or weight on file to calculate BMI.  General Appearance: Casual  Eye Contact:  Fair  Speech:  Normal Rate  Volume:  Normal  Mood:  Anxious  Affect:  Appropriate  Thought Process:  Goal Directed and Descriptions of Associations: Intact  Orientation:  Full (Time, Place, and Person)  Thought Content: Logical   Suicidal Thoughts:  No  Homicidal Thoughts:  No  Memory:  Immediate;   Fair Recent;   Fair Remote;   Fair  Judgement:  Fair  Insight:  Good  Psychomotor Activity:  Normal  Concentration:  Concentration: Fair and Attention Span: Fair  Recall:  [employer] of Knowledge: Fair  Language: Fair  Akathisia:  No  Handed:  Right  AIMS (if indicated): Patient denies any rigidity tremors or stiffness.  Assets:  Communication Skills Desire for Improvement Social Support  ADL's:  Intact  Cognition: WNL  Sleep:  restless on and off   Screenings: PHQ2-9     Office Visit from 01/16/2019 in [facility], [facility] Office Visit from 12/20/2018 in [facility], [facility] Office Visit from 09/17/2018 in [facility], [facility]  PHQ-2 Total Score  0  1  0       Assessment and Plan: [place] is a 38 year old Caucasian female who has a history of bipolar disorder, anxiety disorder was evaluated by telemedicine today.  Patient is biologically predisposed given her history of trauma.  She also has psychosocial stressors of several deaths in the family as well as the current pandemic.  Patient recently had a hypomanic episode and also has a subtherapeutic Depakote level.  She will benefit from medication readjustment.  Plan Bipolar disorder-unstable Increase Depakote ER to 1500 mg p.o. daily Depakote level on 04/16/2019 was reviewed with patient  today-30-subtherapeutic.  Patient reports she has been compliant and has not missed any dosages. Will order Depakote labs again, will mail it to her today.  For insomnia-improving Trazodone 100 mg p.o. nightly  Generalized anxiety disorder-improving She will continue psychotherapy sessions  For bereavement-improving She will continue CBT.  For specific phobia-making progress She will continue psychotherapy sessions  For tobacco use disorder-unstable She is not ready to quit.  Follow-up in clinic in 1 month or sooner if needed.  Appointment scheduled for July 1 at 2:45 PM.  I have spent atleast 25 minutes non face to face with patient today. More than 50 % of the time was spent for psychoeducation and supportive psychotherapy and care coordination.  This note was generated in part or whole with voice recognition software. Voice recognition is usually quite accurate but there are transcription errors that can and very often do occur. I apologize for any typographical errors that were not detected and corrected.          Ursula Alert, MD 04/22/2019, 5:18 PM

## 2019-04-24 NOTE — Progress Notes (Signed)
Left message encouraging contact 

## 2019-05-08 ENCOUNTER — Other Ambulatory Visit: Payer: Self-pay

## 2019-05-08 ENCOUNTER — Ambulatory Visit: Admitting: Licensed Clinical Social Worker

## 2019-05-08 DIAGNOSIS — F3162 Bipolar disorder, current episode mixed, moderate: Secondary | ICD-10-CM

## 2019-05-08 NOTE — Progress Notes (Signed)
Attempted to contact Patient; unable to leave voicemail.  No session

## 2019-05-17 ENCOUNTER — Other Ambulatory Visit: Payer: Self-pay | Admitting: General Surgery

## 2019-05-17 ENCOUNTER — Other Ambulatory Visit (HOSPITAL_COMMUNITY): Payer: Self-pay | Admitting: General Surgery

## 2019-05-22 ENCOUNTER — Other Ambulatory Visit: Payer: Self-pay

## 2019-05-22 ENCOUNTER — Ambulatory Visit (HOSPITAL_COMMUNITY)
Admission: RE | Admit: 2019-05-22 | Discharge: 2019-05-22 | Disposition: A | Discharge status: Home / Self Care | Source: Ambulatory Visit | Attending: General Surgery | Admitting: General Surgery

## 2019-05-22 ENCOUNTER — Ambulatory Visit (INDEPENDENT_AMBULATORY_CARE_PROVIDER_SITE_OTHER): Admitting: Psychiatry

## 2019-05-22 ENCOUNTER — Other Ambulatory Visit: Payer: Self-pay | Admitting: Psychiatry

## 2019-05-22 ENCOUNTER — Encounter: Payer: Self-pay | Admitting: Psychiatry

## 2019-05-22 DIAGNOSIS — F17200 Nicotine dependence, unspecified, uncomplicated: Secondary | ICD-10-CM | POA: Diagnosis not present

## 2019-05-22 DIAGNOSIS — F3162 Bipolar disorder, current episode mixed, moderate: Secondary | ICD-10-CM | POA: Diagnosis not present

## 2019-05-22 DIAGNOSIS — F5101 Primary insomnia: Secondary | ICD-10-CM | POA: Diagnosis not present

## 2019-05-22 DIAGNOSIS — F411 Generalized anxiety disorder: Secondary | ICD-10-CM | POA: Diagnosis not present

## 2019-05-22 DIAGNOSIS — F40298 Other specified phobia: Secondary | ICD-10-CM | POA: Insufficient documentation

## 2019-05-22 NOTE — Progress Notes (Signed)
Virtual Visit via Video Garza  I connected with Gloria Garza on 05/22/19 at  2:45 PM EDT by a video enabled telemedicine application and verified that I am speaking with the correct person using two identifiers.   I discussed the limitations of evaluation and management by telemedicine and the availability of in person appointments. The patient expressed understanding and agreed to proceed.     I discussed the assessment and treatment plan with the patient. The patient was provided an opportunity to ask questions and all were answered. The patient agreed with the plan and demonstrated an understanding of the instructions.   The patient was advised to call back or seek an in-person evaluation if the symptoms worsen or if the condition fails to improve as anticipated.   Gloria Garza  05/22/2019 5:25 PM Gloria Garza  MRN:  [id number]  Chief Complaint:  Chief Complaint    Follow-up     HPI: Gloria Garza is a 38 year old Caucasian female who has a history of bipolar disorder, generalized anxiety disorder, lives in [place], married, was evaluated by telemedicine today.  Patient today reported that she had been waiting for her appointment since the past 45 minutes.  She reported she was under the impression that her appointment time was 2 PM.  Discussed with patient that her scheduled appointment as per our record here is 2:45 PM.    Patient reported that she is currently doing well on the current medication regimen.  She denied any significant anxiety or mood lability.  She denied any sleep problems.  She denied any suicidality, homicidality or perceptual disturbances.  Patient reported she had visit with [place] [facility] where she was evaluated for weight loss surgery.  Patient reported she has upcoming appointment with them and is hopeful that she can get it done.  She seemed very excited about that.  During our conversation however when her Depakote dosage was discussed patient  reported that she was accidentally taking a higher dosage than what was recommended.  Last visit she was advised to take Depakote 1500 mg daily.  Patient reported today that she was unable to get the Depakote labs done as discussed last visit since she never got the lab slip in mail which were sent by [place].  She also did not contact us back to discuss the concern until now.  Her last visit with writer was on 04/22/2019.  Patient reported that she was taking a 1500 mg daily of Depakote in the morning and she was taking another 250 mg at bedtime.  Discussed with patient that it is possible for Depakote to get toxic in her system and also have an effect on her liver function.  Discussed with patient that she needs to stop the Depakote for now since she is taking a huge dosage and needs to go to an urgent care or get Depakote levels done at [facility] today.  Patient was agreeable.  Patient reassured.  Patient was kind of upset when she was advised that Depakote levels can affect her liver function and can get toxic in her system.  Patient however agreeable to hold the Depakote and go and get labs done today.  I have printed out [facility] lab slip and have made it available at front desk for patient to come and collect it today.   Visit Diagnosis:    ICD-10-CM   1. Bipolar 1 disorder, mixed, moderate (HCC)  F31.62 Valproic acid level    Hepatic function panel  Basic metabolic panel    CBC With Differential  2. GAD (generalized anxiety disorder)  F41.1   3. Primary insomnia  F51.01   4. Tobacco use disorder  F17.200   5. Specific phobia  F40.298     Past Psychiatric History: I have reviewed past psychiatric history from my progress Garza on 12/18/2018.  Past trials of Zoloft.  Past Medical History:  Past Medical History:  Diagnosis Date  . Anxiety   . Bipolar disorder ([place])   . Depression   . Gestational diabetes     Past Surgical History:  Procedure Laterality Date  . CESAREAN SECTION    . TUBAL  LIGATION      Family Psychiatric History: I have reviewed family psychiatric history from my progress Garza on 12/18/2018.  Family History:  Family History  Problem Relation Age of Onset  . Diabetes Maternal Grandmother   . Cancer - Colon Maternal Grandmother   . Diabetes Paternal Grandmother   . Heart disease Paternal Grandmother   . Alcohol abuse Brother   . Drug abuse Brother     Social History: I have reviewed social history from my progress Garza on 12/18/2018. Social History   Socioeconomic History  . Marital status: Married    Spouse name: terah  . Number of children: 2  . Years of education: Not on file  . Highest education level: Associate degree: occupational, Hotel manager, or vocational program  Occupational History  . Not on file  Social Needs  . Financial resource strain: Not hard at all  . Food insecurity    Worry: Never true    Inability: Never true  . Transportation needs    Medical: No    Non-medical: No  Tobacco Use  . Smoking status: Former Smoker    Types: Cigarettes    Quit date: 07/18/2018    Years since quitting: 0.8  . Smokeless tobacco: Never Used  Substance and Sexual Activity  . Alcohol use: Not Currently    Comment: occasional   . Drug use: Never  . Sexual activity: Yes  Lifestyle  . Physical activity    Days per week: 0 days    Minutes per session: 0 min  . Stress: Very much  Relationships  . Social Herbalist on phone: Not on file    Gets together: Not on file    Attends religious service: Never    Active member of club or organization: No    Attends meetings of clubs or organizations: Never    Relationship status: Married  Other Topics Concern  . Not on file  Social History Narrative  . Not on file    Allergies: No Known Allergies  Metabolic Disorder Labs: Lab Results  Component Value Date   HGBA1C 6.0 (H) 12/31/2018   Lab Results  Component Value Date   PROLACTIN 14.9 12/31/2018   Lab Results  Component  Value Date   CHOL 200 (H) 12/31/2018   TRIG 262 (H) 12/31/2018   HDL 48 12/31/2018   LDLCALC 100 (H) 12/31/2018   Lab Results  Component Value Date   TSH 1.340 10/29/2018    Therapeutic Level Labs: No results found for: LITHIUM Lab Results  Component Value Date   VALPROATE 30 (L) 04/16/2019   VALPROATE 49 (L) 02/01/2019   No components found for:  CBMZ  Current Medications: Current Outpatient Medications  Medication Sig Dispense Refill  . amoxicillin (AMOXIL) 500 MG capsule     . clonazePAM (KLONOPIN) 0.5 MG  tablet Take 1 tablet (0.5 mg total) by mouth as directed. DAILY ONCE AS NEEDED PRIOR TO GETTING ON FLIGHT 5 tablet 0  . divalproex (DEPAKOTE ER) 500 MG 24 hr tablet Take 3 tablets (1,500 mg total) by mouth daily. 270 tablet 0  . meloxicam (MOBIC) 7.5 MG tablet     . traZODone (DESYREL) 100 MG tablet Take 2 tablets (200 mg total) by mouth at bedtime. SLEEP 180 tablet 0   No current facility-administered medications for this visit.      Musculoskeletal: Strength & Muscle Tone: Unable to assess Gait & Station: Observed as sitting Patient leans: N/A  Psychiatric Specialty Exam: Review of Systems  Psychiatric/Behavioral: Negative for depression, hallucinations, substance abuse and suicidal ideas. The patient is not nervous/anxious.   All other systems reviewed and are negative.   There were no vitals taken for this visit.There is no height or weight on file to calculate BMI.  General Appearance: Casual  Eye Contact:  Fair  Speech:  Normal Rate  Volume:  Normal  Mood:  Euthymic  Affect:  Congruent  Thought Process:  Goal Directed and Descriptions of Associations: Intact  Orientation:  Full (Time, Place, and Person)  Thought Content: Logical   Suicidal Thoughts:  No  Homicidal Thoughts:  No  Memory:  Immediate;   Fair Recent;   Fair Remote;   Fair  Judgement:  Fair  Insight:  Fair  Psychomotor Activity:  Normal  Concentration:  Concentration: Fair and  Attention Span: Fair  Recall:  [employer] of Knowledge: Fair  Language: Fair  Akathisia:  No  Handed:  Right  AIMS (if indicated): Denies tremors, rigidity, stiffness  Assets:  Communication Skills Desire for Improvement Housing Intimacy Social Support  ADL's:  Intact  Cognition: WNL  Sleep:  Fair   Screenings: PHQ2-9     Office Visit from 01/16/2019 in [facility], [facility] Office Visit from 12/20/2018 in [facility], [facility] Office Visit from 09/17/2018 in [facility], [facility]  PHQ-2 Total Score  0  1  0       Assessment and Plan: Esabella is a 38 year old Caucasian female who has a history of bipolar disorder, anxiety disorder was evaluated by telemedicine today.  Patient is biologically predisposed given her history of trauma.  She also has psychosocial stressors of several deaths in the family, current pandemic.  Patient however reported doing well on the current medication regimen.  However during our conversation patient reportedly told writer that she was accidentally taking a higher than discussed dosage of Depakote.  Hence discussed with patient to go to [facility] immediately or go to the nearest urgent care to get her Depakote levels done.  Patient was mailed a lab slip last visit however today she reports she never got it in her mail and she also did not call us to let us know about getting another one.  Plan For bipolar disorder- improving Depakote ER 1500 mg p.o. daily Patient however was taking 1750 mg p.o. daily of Depakote. Depakote level on 04/16/2019-30-subtherapeutic Patient was mailed Depakote level lab slip on 04/22/2019-patient however reports she did not get it. Will order the following labs today-Depakote level, hepatic function, CBC with differential.  Patient to go to the nearest urgent care if she has any symptoms of Depakote toxicity which was discussed with patient.  Also discussed her to get her Depakote levels today.  For  insomnia-improving Trazodone 100 mg p.o. nightly  For GAD-improving She will continue psychotherapy sessions.  For bereavement-improving.   She will continue CBT  For specific phobia-making progress She will continue psychotherapy sessions  For tobacco use disorder-unstable We will continue to provide smoking cessation counseling.  I have reviewed medical records from [place] [facility]-dated 05/15/2019-"  patient presented for bariatric surgery evaluation.  She completed our Neurosurgeon.  She is interested in sleeve gastrectomy.'  Follow-up in clinic in 3 to 4 weeks or sooner if needed.  I have spent atleast 25 minutes non face to face with patient today. More than 50 % of the time was spent for psychoeducation and supportive psychotherapy and care coordination.  This Garza was generated in part or whole with voice recognition software. Voice recognition is usually quite accurate but there are transcription errors that can and very often do occur. I apologize for any typographical errors that were not detected and corrected.        Ursula Alert, MD 05/22/2019, 5:25 PM

## 2019-05-23 ENCOUNTER — Telehealth: Payer: Self-pay | Admitting: Psychiatry

## 2019-05-23 LAB — CBC WITH DIFFERENTIAL
Basophils Absolute: 0 10*3/uL (ref 0.0–0.2)
Basos: 0 %
EOS (ABSOLUTE): 0.1 10*3/uL (ref 0.0–0.4)
Eos: 1 %
Hematocrit: 39.2 % (ref 34.0–46.6)
Hemoglobin: 12.5 g/dL (ref 11.1–15.9)
Immature Grans (Abs): 0 10*3/uL (ref 0.0–0.1)
Immature Granulocytes: 0 %
Lymphocytes Absolute: 1.9 10*3/uL (ref 0.7–3.1)
Lymphs: 22 %
MCH: 29.3 pg (ref 26.6–33.0)
MCHC: 31.9 g/dL (ref 31.5–35.7)
MCV: 92 fL (ref 79–97)
Monocytes Absolute: 0.9 10*3/uL (ref 0.1–0.9)
Monocytes: 10 %
Neutrophils Absolute: 6 10*3/uL (ref 1.4–7.0)
Neutrophils: 67 %
RBC: 4.26 x10E6/uL (ref 3.77–5.28)
RDW: 16.3 % — ABNORMAL HIGH (ref 11.7–15.4)
WBC: 9 10*3/uL (ref 3.4–10.8)

## 2019-05-23 LAB — HEPATIC FUNCTION PANEL
ALT: 54 IU/L — ABNORMAL HIGH (ref 0–32)
AST: 35 IU/L (ref 0–40)
Albumin: 4.3 g/dL (ref 3.8–4.8)
Alkaline Phosphatase: 64 IU/L (ref 39–117)
Bilirubin Total: 0.2 mg/dL (ref 0.0–1.2)
Bilirubin, Direct: 0.1 mg/dL (ref 0.00–0.40)
Total Protein: 6.7 g/dL (ref 6.0–8.5)

## 2019-05-23 LAB — VALPROIC ACID LEVEL: Valproic Acid Lvl: 49 ug/mL — ABNORMAL LOW (ref 50–100)

## 2019-05-23 NOTE — Telephone Encounter (Signed)
Called patient to discuss Depakote

## 2019-05-23 NOTE — Telephone Encounter (Signed)
Called patient to discuss depakote level , cbc, cmp. Her depakote level is 49 - subtherapeutic. Discussed ALT - elevated slightly. She however reports she wants to stay on depakote - hence reduce dosage to 1500 mg , she was accidentally taking 1750 mg . Discussed that her labs needed to be monitored closely. She will need another depakote level and LFT soon. Will mail it to her.

## 2019-06-05 ENCOUNTER — Other Ambulatory Visit: Payer: Self-pay | Admitting: Psychiatry

## 2019-06-06 ENCOUNTER — Telehealth (HOSPITAL_COMMUNITY): Payer: Self-pay | Admitting: Psychiatry

## 2019-06-06 LAB — HEPATIC FUNCTION PANEL
ALT: 48 IU/L — ABNORMAL HIGH (ref 0–32)
AST: 31 IU/L (ref 0–40)
Albumin: 4.4 g/dL (ref 3.8–4.8)
Alkaline Phosphatase: 60 IU/L (ref 39–117)
Bilirubin Total: 0.3 mg/dL (ref 0.0–1.2)
Bilirubin, Direct: 0.1 mg/dL (ref 0.00–0.40)
Total Protein: 7 g/dL (ref 6.0–8.5)

## 2019-06-06 LAB — VALPROIC ACID LEVEL: Valproic Acid Lvl: 44 ug/mL — ABNORMAL LOW (ref 50–100)

## 2019-06-06 NOTE — Telephone Encounter (Signed)
Reviewed labs, ordered 7/15.  LFT wnl except ALT 48. Depakote 44. The result will be sent to scan center.   Please advise the patient- although ALT (liver function test) is still higher than normal range, it is trending down compared to her recent blood workn. I would advise her to stay on current dose of Depakote at this time unless she has any side effect from this medication. Would advise continue to have routine monitoring with Dr. Shea Evans or her PCP.

## 2019-06-10 NOTE — Telephone Encounter (Signed)
Message was left for patient to return call for labwork. Pt was told to stay at current dose of depakote and that a copy of labwork would be fax to pcp

## 2019-06-11 ENCOUNTER — Other Ambulatory Visit: Payer: Self-pay

## 2019-06-11 ENCOUNTER — Encounter: Payer: Self-pay | Admitting: Dietician

## 2019-06-11 ENCOUNTER — Encounter: Attending: General Surgery | Admitting: Dietician

## 2019-06-11 VITALS — Ht 67.0 in | Wt 285.1 lb

## 2019-06-11 DIAGNOSIS — F319 Bipolar disorder, unspecified: Secondary | ICD-10-CM | POA: Diagnosis not present

## 2019-06-11 DIAGNOSIS — Z833 Family history of diabetes mellitus: Secondary | ICD-10-CM | POA: Insufficient documentation

## 2019-06-11 DIAGNOSIS — Z8249 Family history of ischemic heart disease and other diseases of the circulatory system: Secondary | ICD-10-CM | POA: Diagnosis not present

## 2019-06-11 DIAGNOSIS — Z713 Dietary counseling and surveillance: Secondary | ICD-10-CM | POA: Diagnosis present

## 2019-06-11 DIAGNOSIS — R7303 Prediabetes: Secondary | ICD-10-CM | POA: Insufficient documentation

## 2019-06-11 DIAGNOSIS — Z6841 Body Mass Index (BMI) 40.0 and over, adult: Secondary | ICD-10-CM | POA: Diagnosis not present

## 2019-06-11 NOTE — Progress Notes (Signed)
Nutrition and Diabetes Education [facility] [place], [place] [address]  Date: 06/11/19 Re: Gloria Garza DOB: 1981-01-07 MRN: [id number]   MD: Dr. Greer Pickerel RD:   Solon Palm, CDE        Diagnosis: Nutrition Assessment: Patient came for pre-bariatric nutrition evaluation visit. She states that she plans to have sleeve gastrectomy bariatric surgery.   Height:  67 in  Weight:   285.1 lbs BMI: 44.6 IBW/ %IBW: 200%  Weight Loss Goal: 180 lbs  Medical History: morbid obesity, bipolar disorder, anxiety disorder Medications/Supplements: Depakote, trazodone Drug Allergies: NKDA Food Allergies:  NKDA; reports lactose intolerance Alcohol Intake: wine once monthly when out with friends Tobacco Use: none Physical Activity: walks for exercise 3 days/week for 1 mile Weight History: Reports weight of 120-130 lbs in high school. Weighed 150 lbs before birth of 1st child, 12 years ago. Has not been less than 200 lbs since then. Weighed 270 lbs 4 years ago. States has gained 15-20 lbs in the past year.  Diet/Weight Loss History: Has tried weight watcher's diet years ago with very little success. Most recently tried the Keto diet and lost 10 lbs but regained.  Dietary Recall: Food and Fluid: Recall: 10:30 am- Special K cereal or 2 hard boiled eggs, avocado, 3 slices bacon Snack- pears or cheese stick 1:00pm- tuna/crackers or a salad Snack- fruit or cheese stick 6:00pm- meat and vegetables Snacks: describes as her problem area related to diet; snacks on sweets, chips, etc.  Beverages: Drinks mainly water-48-56 oz daily; also drinks 1 diet coke at dinner Dines out: 5 meals/week;mainly on weekends Grocery shopping/ cooking: Patient and spouse share the grocery shopping and cooking.   Psychosocial: Patient does not work outside of the home. She states she tries to have things to do to avoid eating when not hungry. She denies any form of  purging, either laxatives or vomiting.  Instruction: Patient was instructed on and is aware of the following guideline/recommendations:  Pre and Post operative dietary guidelines  The need for daily multi-vitamin and  calcium supplementation following surgery.  Protein needs post surgery.  The importance of adequate fluid post surgery.   Alcohol intake and cigarette use are strongly discouraged after surgery.  That carbonated beverages must be eliminated 2 weeks prior to surgery and after surgery.  That planning pre op and post op meals is important.  Exercise is a needed adjunct for weight loss.  Education Materials given: Simple meal planning guide Pre-op nutrition goals Protein Supplement suggestions 2 week pre-op diet  Summary: From a nutrition standpoint, I feel that             Gloria Garza  is a good candidate to continue the bariatric surgery process. She has given much thought over a long period of time in making this decision and  participated in an online seminar. She wants to feel better and be healthy for her children and is hoping that weight loss will help decrease back pain. She states that her wife is very health conscious and is very supportive of her decision to have the surgery.  Her next follow-up appointment is 07/11/19     The patient is requested to participate in pre and post-operative follow-up appointments (2-weeks pre-op, 2 weeks post-op, 8-weeks post-op, 3 months post-op, 53-months post op, and 1-year post-op). This is to promote his weight loss goals.  If you have any questions or need any further information regarding your patient, please feel free to  contact me at [phone].  Sincerely,   [name], RD, LDN Registered Dietitian Nutrition and [place]: [phone] F: [name]

## 2019-06-17 ENCOUNTER — Telehealth: Payer: Self-pay

## 2019-06-17 DIAGNOSIS — G4700 Insomnia, unspecified: Secondary | ICD-10-CM

## 2019-06-17 MED ORDER — TRAZODONE HCL 100 MG PO TABS: 200.0000 mg | ORAL_TABLET | Freq: Every day | ORAL | 0 refills | Status: DC

## 2019-06-17 NOTE — Telephone Encounter (Signed)
pt called left a message that she needs a refill on her medications to do until her next appt.

## 2019-06-17 NOTE — Telephone Encounter (Signed)
Sent Trazodone to pharmacy.

## 2019-07-01 ENCOUNTER — Encounter: Payer: Self-pay | Admitting: Psychiatry

## 2019-07-01 ENCOUNTER — Ambulatory Visit (INDEPENDENT_AMBULATORY_CARE_PROVIDER_SITE_OTHER): Admitting: Psychiatry

## 2019-07-01 ENCOUNTER — Other Ambulatory Visit: Payer: Self-pay

## 2019-07-01 DIAGNOSIS — F5101 Primary insomnia: Secondary | ICD-10-CM

## 2019-07-01 DIAGNOSIS — F3162 Bipolar disorder, current episode mixed, moderate: Secondary | ICD-10-CM

## 2019-07-01 DIAGNOSIS — F17200 Nicotine dependence, unspecified, uncomplicated: Secondary | ICD-10-CM

## 2019-07-01 DIAGNOSIS — F40298 Other specified phobia: Secondary | ICD-10-CM

## 2019-07-01 DIAGNOSIS — F411 Generalized anxiety disorder: Secondary | ICD-10-CM | POA: Diagnosis not present

## 2019-07-01 MED ORDER — DIVALPROEX SODIUM ER 500 MG PO TB24: 1500.0000 mg | ORAL_TABLET | Freq: Every day | ORAL | 0 refills | Status: DC

## 2019-07-01 NOTE — Progress Notes (Signed)
Virtual Visit via Video Note  I connected with Gloria Garza on 07/01/19 at  2:45 PM EDT by a video enabled telemedicine application and verified that I am speaking with the correct person using two identifiers.   I discussed the limitations of evaluation and management by telemedicine and the availability of in person appointments. The patient expressed understanding and agreed to proceed.    I discussed the assessment and treatment plan with the patient. The patient was provided an opportunity to ask questions and all were answered. The patient agreed with the plan and demonstrated an understanding of the instructions.   The patient was advised to call back or seek an in-person evaluation if the symptoms worsen or if the condition fails to improve as anticipated.   [place] MD OP Progress Note  07/01/2019 3:01 PM Gloria Garza  MRN:  [id number]  Chief Complaint:  Chief Complaint    Follow-up     HPI: Gloria Garza is a 38 year old Caucasian female, has a history of bipolar disorder, GAD, lives in [place], married, was evaluated by telemedicine today.  Patient today reports she has started working at [employer].  She is currently working as a Scientist, water quality.  She reports work is going well.  She enjoys it.  This way she is not home all day and this is also helped her with her mood.  She reports she is compliant on her medications.  She denies any side effects to the medications.  Patient reports she is sleeping okay on the trazodone.  Patient denies any suicidality, homicidality or perceptual disturbances.  Patient denies any other concerns today. Visit Diagnosis:    ICD-10-CM   1. Bipolar 1 disorder, mixed, moderate (HCC)  F31.62 divalproex (DEPAKOTE ER) 500 MG 24 hr tablet  2. GAD (generalized anxiety disorder)  F41.1   3. Primary insomnia  F51.01   4. Tobacco use disorder  F17.200   5. Specific phobia  F40.298     Past Psychiatric History: I have reviewed past psychiatric history  from my progress note on 12/18/2018.  Past trials of Zoloft.  Past Medical History:  Past Medical History:  Diagnosis Date  . Anxiety   . Bipolar disorder ([place])   . Depression   . Gestational diabetes     Past Surgical History:  Procedure Laterality Date  . CESAREAN SECTION    . TUBAL LIGATION      Family Psychiatric History: I have reviewed family psychiatric history from my progress note on 12/18/2018.  Family History:  Family History  Problem Relation Age of Onset  . Diabetes Maternal Grandmother   . Cancer - Colon Maternal Grandmother   . Diabetes Paternal Grandmother   . Heart disease Paternal Grandmother   . Alcohol abuse Brother   . Drug abuse Brother     Social History: I have reviewed social history from my progress note on 12/18/2018. Social History   Socioeconomic History  . Marital status: Married    Spouse name: terah  . Number of children: 2  . Years of education: Not on file  . Highest education level: Associate degree: occupational, Hotel manager, or vocational program  Occupational History  . Not on file  Social Needs  . Financial resource strain: Not hard at all  . Food insecurity    Worry: Never true    Inability: Never true  . Transportation needs    Medical: No    Non-medical: No  Tobacco Use  . Smoking status: Former Smoker    Types:  Cigarettes    Quit date: 07/18/2018    Years since quitting: 0.9  . Smokeless tobacco: Never Used  Substance and Sexual Activity  . Alcohol use: Not Currently    Comment: occasional   . Drug use: Never  . Sexual activity: Yes  Lifestyle  . Physical activity    Days per week: 0 days    Minutes per session: 0 min  . Stress: Very much  Relationships  . Social Herbalist on phone: Not on file    Gets together: Not on file    Attends religious service: Never    Active member of club or organization: No    Attends meetings of clubs or organizations: Never    Relationship status: Married  Other  Topics Concern  . Not on file  Social History Narrative  . Not on file    Allergies: No Known Allergies  Metabolic Disorder Labs: Lab Results  Component Value Date   HGBA1C 6.0 (H) 12/31/2018   Lab Results  Component Value Date   PROLACTIN 14.9 12/31/2018   Lab Results  Component Value Date   CHOL 200 (H) 12/31/2018   TRIG 262 (H) 12/31/2018   HDL 48 12/31/2018   LDLCALC 100 (H) 12/31/2018   Lab Results  Component Value Date   TSH 1.340 10/29/2018    Therapeutic Level Labs: No results found for: LITHIUM Lab Results  Component Value Date   VALPROATE 44 (L) 06/05/2019   VALPROATE 49 (L) 05/22/2019   No components found for:  CBMZ  Current Medications: Current Outpatient Medications  Medication Sig Dispense Refill  . amoxicillin (AMOXIL) 500 MG capsule     . clonazePAM (KLONOPIN) 0.5 MG tablet Take 1 tablet (0.5 mg total) by mouth as directed. DAILY ONCE AS NEEDED PRIOR TO GETTING ON FLIGHT (Patient not taking: Reported on 06/11/2019) 5 tablet 0  . divalproex (DEPAKOTE ER) 500 MG 24 hr tablet Take 3 tablets (1,500 mg total) by mouth daily. 270 tablet 0  . meloxicam (MOBIC) 7.5 MG tablet     . traZODone (DESYREL) 100 MG tablet Take 2 tablets (200 mg total) by mouth at bedtime. SLEEP 180 tablet 0   No current facility-administered medications for this visit.      Musculoskeletal: Strength & Muscle Tone: UTA Gait & Station: wnl Patient leans: N/A  Psychiatric Specialty Exam: Review of Systems  Psychiatric/Behavioral: The patient is not nervous/anxious.   All other systems reviewed and are negative.   There were no vitals taken for this visit.There is no height or weight on file to calculate BMI.  General Appearance: Casual  Eye Contact:  Fair  Speech:  Normal Rate  Volume:  Normal  Mood:  Euthymic  Affect:  Congruent  Thought Process:  Goal Directed and Descriptions of Associations: Intact  Orientation:  Full (Time, Place, and Person)  Thought Content:  Logical   Suicidal Thoughts:  No  Homicidal Thoughts:  No  Memory:  Immediate;   Fair Recent;   Fair Remote;   Fair  Judgement:  Fair  Insight:  Fair  Psychomotor Activity:  Normal  Concentration:  Concentration: Fair and Attention Span: Fair  Recall:  [employer] of Knowledge: Fair  Language: Fair  Akathisia:  No  Handed:  Right  AIMS (if indicated):denies tremors, rigidity  Assets:  Communication Skills Desire for Improvement Social Support  ADL's:  Intact  Cognition: WNL  Sleep:  Fair   Screenings: PHQ2-9     Nutrition from  06/11/2019 in [place] Office Visit from 01/16/2019 in [facility], [facility] Office Visit from 12/20/2018 in [facility], [facility] Office Visit from 09/17/2018 in [facility], [facility]  PHQ-2 Total Score  0  0  1  0       Assessment and Plan: Gloria Garza is a 38 year old Caucasian female who has a history of bipolar disorder, anxiety disorder was evaluated by telemedicine today.  Patient is biologically predisposed given her history of trauma.  She also has psychosocial stressors of several deaths in the family, current pandemic.  She is currently making progress on the current medication regimen.  Plan For bipolar disorder-improving Depakote ER 1500 mg p.o. daily Depakote level 06/05/2019 -44- subtherapeutic. Will not make any medication changes since she is doing well.  For insomnia-improving Trazodone 100 mg p.o. nightly   GAD-improving Continue psychotherapy sessions  For bereavement-improving Continue CBT  For specific phobia-making progress Continue CBT  Follow-up in clinic in 2 months or sooner if needed.  October 20 at 10:30 AM  I have spent atleast 15 minutes non face to face with patient today. More than 50 % of the time was spent for psychoeducation and supportive psychotherapy and care coordination.  This note was generated in part or whole with voice recognition software. Voice recognition  is usually quite accurate but there are transcription errors that can and very often do occur. I apologize for any typographical errors that were not detected and corrected.       Ursula Alert, MD 07/01/2019, 3:01 PM

## 2019-07-01 NOTE — Progress Notes (Deleted)
error 

## 2019-07-03 ENCOUNTER — Ambulatory Visit (INDEPENDENT_AMBULATORY_CARE_PROVIDER_SITE_OTHER): Admitting: Licensed Clinical Social Worker

## 2019-07-03 ENCOUNTER — Other Ambulatory Visit: Payer: Self-pay

## 2019-07-03 DIAGNOSIS — F3162 Bipolar disorder, current episode mixed, moderate: Secondary | ICD-10-CM

## 2019-07-10 ENCOUNTER — Encounter: Payer: Self-pay | Admitting: Dietician

## 2019-07-10 ENCOUNTER — Encounter: Attending: General Surgery | Admitting: Dietician

## 2019-07-10 ENCOUNTER — Other Ambulatory Visit: Payer: Self-pay

## 2019-07-10 VITALS — Ht 67.0 in | Wt 281.1 lb

## 2019-07-10 DIAGNOSIS — Z6841 Body Mass Index (BMI) 40.0 and over, adult: Secondary | ICD-10-CM | POA: Insufficient documentation

## 2019-07-10 DIAGNOSIS — Z833 Family history of diabetes mellitus: Secondary | ICD-10-CM | POA: Insufficient documentation

## 2019-07-10 DIAGNOSIS — R7303 Prediabetes: Secondary | ICD-10-CM | POA: Insufficient documentation

## 2019-07-10 DIAGNOSIS — F319 Bipolar disorder, unspecified: Secondary | ICD-10-CM | POA: Insufficient documentation

## 2019-07-10 DIAGNOSIS — Z8249 Family history of ischemic heart disease and other diseases of the circulatory system: Secondary | ICD-10-CM | POA: Diagnosis not present

## 2019-07-10 DIAGNOSIS — Z713 Dietary counseling and surveillance: Secondary | ICD-10-CM | POA: Diagnosis not present

## 2019-07-10 NOTE — Patient Instructions (Signed)
   Continue with current healthy eating pattern, great job!  Gradually reduce the amount of fluids you drink during meals, so you can easily avoid this after surgery.

## 2019-07-10 NOTE — Progress Notes (Signed)
Appt start time: 1330 end time:  1400.  Assessment:   #1 SWL Appointment.   Start Wt at NDES: 285.1lbs Wt: 281.1lbs Ht: 5'7" BMI: 44.03  Preferred Learning Style:   Auditory  Visual    Learning Readiness:   Change in progress  MEDICATIONS: divalproex, traZODone  Progress:  Patient voices understanding of pre-op diet goals and is already following most of these goals.   She was planning and preparing to have weight loss surgery some time ago, but then experienced sudden loss of 2 family members and postponed surgery until now.   Weight has decreased by 4lbs since previous visit on 06/11/19.  She has started a new job working part-time at [employer].  DIETARY INTAKE: 24-hr recall:  Breakfast: eggs with avocado, bacon; Special K cereal  Snack: vegetables; yoplait yogurt  Lunch: tuna and crackers; salad; mini veg platter with cheese Snack: same as am Dinner: meat and veg meals, beans; decreasing rice (maybe once a month), potatoes Snack: none recently since starting new job Beverages: water throughout the day; det coke  Usual physical activity: lightly active job, part time  Diet to Follow: reduced carbohydrates adequate protein low fat              Nutritional Diagnosis:  [place]-3.3 Overweight/obesity related to history of excess calories and physical inactivity as evidenced by patient current BMI of 44.03 following dietary guidelines for continued weight loss prior to bariatric surgery.              Intervention:   . Nutrition counseling for weight loss prior to upcoming bariatric surgery. . Commended patient for dietary improvements.  . She is motivated to continue with her current eating pattern and working on pre-op goals.  . Encouraged gradual increase in physical activity as able.   Teaching Method Utilized:  Visual Auditory Hands on  Handouts given during visit include:  Diet stage template  Pre-op diet goals  Goals and instructions  (AVS)  Barriers to learning/adherence to lifestyle change: none  Demonstrated degree of understanding via:  Teach Back   Monitoring/Evaluation:  Dietary intake, exercise, and body weight 08/14/19 at 11:00am.

## 2019-07-11 ENCOUNTER — Ambulatory Visit: Admitting: Child and Adolescent Psychiatry

## 2019-07-11 ENCOUNTER — Ambulatory Visit: Admitting: Dietician

## 2019-07-17 ENCOUNTER — Ambulatory Visit (INDEPENDENT_AMBULATORY_CARE_PROVIDER_SITE_OTHER): Admitting: Adult Health

## 2019-07-17 ENCOUNTER — Other Ambulatory Visit: Payer: Self-pay

## 2019-07-17 ENCOUNTER — Encounter: Payer: Self-pay | Admitting: Adult Health

## 2019-07-17 VITALS — BP 140/80 | HR 93 | Resp 16 | Ht 67.0 in | Wt 282.0 lb

## 2019-07-17 DIAGNOSIS — F17219 Nicotine dependence, cigarettes, with unspecified nicotine-induced disorders: Secondary | ICD-10-CM | POA: Diagnosis not present

## 2019-07-17 DIAGNOSIS — E781 Pure hyperglyceridemia: Secondary | ICD-10-CM | POA: Diagnosis not present

## 2019-07-17 DIAGNOSIS — R740 Nonspecific elevation of levels of transaminase and lactic acid dehydrogenase [LDH]: Secondary | ICD-10-CM | POA: Diagnosis not present

## 2019-07-17 DIAGNOSIS — R7401 Elevation of levels of liver transaminase levels: Secondary | ICD-10-CM

## 2019-07-17 DIAGNOSIS — Z6841 Body Mass Index (BMI) 40.0 and over, adult: Secondary | ICD-10-CM

## 2019-07-17 NOTE — Progress Notes (Signed)
[facility] [place], [place] [address]  Internal MEDICINE  Office Visit Note  Patient Name: Gloria Garza  [id number]  [id number]  Date of Service: 07/17/2019  Chief Complaint  Patient presents with  . Diabetes  . Depression    HPI  Pt is here for follow up.  She reports she has stopped smoking cigarettes, and has drastically reduced her alcohol intake.  She is now only drinking wine, once a month.  She also has a new job working at [employer]. She is seeing a dietician, and has lost 4 pounds. She is tentatively going to have weight loss surgery in January.        Current Medication: Outpatient Encounter Medications as of 07/17/2019  Medication Sig  . divalproex (DEPAKOTE ER) 500 MG 24 hr tablet Take 3 tablets (1,500 mg total) by mouth daily.  . traZODone (DESYREL) 100 MG tablet Take 2 tablets (200 mg total) by mouth at bedtime. SLEEP   No facility-administered encounter medications on file as of 07/17/2019.     Surgical History: Past Surgical History:  Procedure Laterality Date  . CESAREAN SECTION    . TUBAL LIGATION      Medical History: Past Medical History:  Diagnosis Date  . Anxiety   . Bipolar disorder ([place])   . Depression   . Gestational diabetes     Family History: Family History  Problem Relation Age of Onset  . Diabetes Maternal Grandmother   . Cancer - Colon Maternal Grandmother   . Diabetes Paternal Grandmother   . Heart disease Paternal Grandmother   . Alcohol abuse Brother   . Drug abuse Brother     Social History   Socioeconomic History  . Marital status: Married    Spouse name: terah  . Number of children: 2  . Years of education: Not on file  . Highest education level: Associate degree: occupational, Hotel manager, or vocational program  Occupational History  . Not on file  Social Needs  . Financial resource strain: Not hard at all  . Food insecurity    Worry: Never true    Inability: Never true  .  Transportation needs    Medical: No    Non-medical: No  Tobacco Use  . Smoking status: Former Smoker    Types: Cigarettes    Quit date: 07/18/2018    Years since quitting: 0.9  . Smokeless tobacco: Never Used  Substance and Sexual Activity  . Alcohol use: Not Currently    Comment: occasional   . Drug use: Never  . Sexual activity: Yes  Lifestyle  . Physical activity    Days per week: 0 days    Minutes per session: 0 min  . Stress: Very much  Relationships  . Social Herbalist on phone: Not on file    Gets together: Not on file    Attends religious service: Never    Active member of club or organization: No    Attends meetings of clubs or organizations: Never    Relationship status: Married  . Intimate partner violence    Fear of current or ex partner: No    Emotionally abused: No    Physically abused: No    Forced sexual activity: No  Other Topics Concern  . Not on file  Social History Narrative  . Not on file      Review of Systems  Constitutional: Negative for chills, fatigue and unexpected weight change.  HENT: Negative for congestion,  rhinorrhea, sneezing and sore throat.   Eyes: Negative for photophobia, pain and redness.  Respiratory: Negative for cough, chest tightness and shortness of breath.   Cardiovascular: Negative for chest pain and palpitations.  Gastrointestinal: Negative for abdominal pain, constipation, diarrhea, nausea and vomiting.  Endocrine: Negative.   Genitourinary: Negative for dysuria and frequency.  Musculoskeletal: Negative for arthralgias, back pain, joint swelling and neck pain.  Skin: Negative for rash.  Allergic/Immunologic: Negative.   Neurological: Negative for tremors and numbness.  Hematological: Negative for adenopathy. Does not bruise/bleed easily.  Psychiatric/Behavioral: Negative for behavioral problems and sleep disturbance. The patient is not nervous/anxious.     Vital Signs: BP 140/80   Pulse 93   Resp 16    Ht 5\' 7"  (1.702 m)   Wt 282 lb (127.9 kg)   SpO2 98%   BMI 44.17 kg/m    Physical Exam Vitals signs and nursing note reviewed.  Constitutional:      General: She is not in acute distress.    Appearance: She is well-developed. She is not diaphoretic.  HENT:     Head: Normocephalic and atraumatic.     Mouth/Throat:     Pharynx: No oropharyngeal exudate.  Eyes:     Pupils: Pupils are equal, round, and reactive to light.  Neck:     Musculoskeletal: Normal range of motion and neck supple.     Thyroid: No thyromegaly.     Vascular: No JVD.     Trachea: No tracheal deviation.  Cardiovascular:     Rate and Rhythm: Normal rate and regular rhythm.     Heart sounds: Normal heart sounds. No murmur. No friction rub. No gallop.   Pulmonary:     Effort: Pulmonary effort is normal. No respiratory distress.     Breath sounds: Normal breath sounds. No wheezing or rales.  Chest:     Chest wall: No tenderness.  Abdominal:     Palpations: Abdomen is soft.     Tenderness: There is no abdominal tenderness. There is no guarding.  Musculoskeletal: Normal range of motion.  Lymphadenopathy:     Cervical: No cervical adenopathy.  Skin:    General: Skin is warm and dry.  Neurological:     Mental Status: She is alert and oriented to person, place, and time.     Cranial Nerves: No cranial nerve deficit.  Psychiatric:        Behavior: Behavior normal.        Thought Content: Thought content normal.        Judgment: Judgment normal.    Assessment/Plan: 1. Hypertriglyceridemia PT has lost some weight and is planning on weight loss surgery in janurary.  Will recheck lipid panel in 6 months.   2. Class 3 severe obesity due to excess calories with serious comorbidity and body mass index (BMI) of 40.0 to 44.9 in adult [facility]) Obesity Counseling: Risk Assessment: An assessment of behavioral risk factors was made today and includes lack of exercise sedentary lifestyle, lack of portion control and poor  dietary habits.  Risk Modification Advice: She was counseled on portion control guidelines. Restricting daily caloric intake to. . The detrimental long term effects of obesity on her health and ongoing poor compliance was also discussed with the patient.  3. Elevated ALT measurement trending down at this time, will recheck in the coming months to ensure return to normal.  4. Cigarette nicotine dependence with nicotine-induced disorder Smoking cessation counseling: 1. Pt acknowledges the risks of long term smoking, she  will try to quite smoking. 2. Options for different medications including nicotine products, chewing gum, patch etc, Wellbutrin and Chantix is discussed 3. Goal and date of compete cessation is discussed 4. Total time spent in smoking cessation is 15 min.   General Counseling: Apple verbalizes understanding of the findings of todays visit and agrees with plan of treatment. I have discussed any further diagnostic evaluation that may be needed or ordered today. We also reviewed her medications today. she has been encouraged to call the office with any questions or concerns that should arise related to todays visit.    No orders of the defined types were placed in this encounter.   No orders of the defined types were placed in this encounter.   Time spent: 15 Minutes   This patient was seen by Orson Gear AGNP-C in Collaboration with Dr Lavera Guise as a part of collaborative care agreement     Kendell Bane AGNP-C Internal medicine

## 2019-08-01 ENCOUNTER — Other Ambulatory Visit: Payer: Self-pay | Admitting: Adult Health

## 2019-08-02 LAB — COMPREHENSIVE METABOLIC PANEL
ALT: 39 IU/L — ABNORMAL HIGH (ref 0–32)
AST: 21 IU/L (ref 0–40)
Albumin/Globulin Ratio: 1.8 (ref 1.2–2.2)
Albumin: 4.2 g/dL (ref 3.8–4.8)
Alkaline Phosphatase: 62 IU/L (ref 39–117)
BUN/Creatinine Ratio: 14 (ref 9–23)
BUN: 8 mg/dL (ref 6–20)
Bilirubin Total: <0.2 mg/dL (ref 0.0–1.2)
CO2: 22 mmol/L (ref 20–29)
Calcium: 8.6 mg/dL — ABNORMAL LOW (ref 8.7–10.2)
Chloride: 108 mmol/L — ABNORMAL HIGH (ref 96–106)
Creatinine, Ser: 0.57 mg/dL (ref 0.57–1.00)
GFR calc Af Amer: 138 mL/min/{1.73_m2} (ref >59)
GFR calc non Af Amer: 120 mL/min/{1.73_m2} (ref >59)
Globulin, Total: 2.3 g/dL (ref 1.5–4.5)
Glucose: 129 mg/dL — ABNORMAL HIGH (ref 65–99)
Potassium: 4.6 mmol/L (ref 3.5–5.2)
Sodium: 142 mmol/L (ref 134–144)
Total Protein: 6.5 g/dL (ref 6.0–8.5)

## 2019-08-07 ENCOUNTER — Encounter: Payer: Self-pay | Admitting: Adult Health

## 2019-08-07 ENCOUNTER — Other Ambulatory Visit: Payer: Self-pay

## 2019-08-07 ENCOUNTER — Ambulatory Visit (INDEPENDENT_AMBULATORY_CARE_PROVIDER_SITE_OTHER): Admitting: Adult Health

## 2019-08-07 VITALS — BP 140/89 | HR 101 | Temp 97.1°F | Resp 16 | Ht 67.0 in | Wt 285.0 lb

## 2019-08-07 DIAGNOSIS — Z30014 Encounter for initial prescription of intrauterine contraceptive device: Secondary | ICD-10-CM

## 2019-08-07 DIAGNOSIS — R7401 Elevation of levels of liver transaminase levels: Secondary | ICD-10-CM

## 2019-08-07 DIAGNOSIS — B349 Viral infection, unspecified: Secondary | ICD-10-CM | POA: Diagnosis not present

## 2019-08-07 DIAGNOSIS — R740 Nonspecific elevation of levels of transaminase and lactic acid dehydrogenase [LDH]: Secondary | ICD-10-CM | POA: Diagnosis not present

## 2019-08-07 DIAGNOSIS — Z6841 Body Mass Index (BMI) 40.0 and over, adult: Secondary | ICD-10-CM

## 2019-08-07 NOTE — Progress Notes (Signed)
[facility] [place],  [address]  Internal MEDICINE  Office Visit Note  Patient Name: Gloria Garza  [id number]  [id number]  Date of Service: 08/07/2019  Chief Complaint  Patient presents with  . Medical Management of Chronic Issues    review labs , having cold symptoms , pt states she is not feeling well today , cold symptoms, sob some     HPI PT is here to review labs.  Her ALT is trending down, and is nearing normal.  She is reporting today that she is having cold symptoms.  She reports the symptoms started yesterday, she describes a migraine yesterday and today has head pressure and and generally feeling bad.  Denies fever, or sore throat.      Current Medication: Outpatient Encounter Medications as of 08/07/2019  Medication Sig  . divalproex (DEPAKOTE ER) 500 MG 24 hr tablet Take 3 tablets (1,500 mg total) by mouth daily.  . traZODone (DESYREL) 100 MG tablet Take 2 tablets (200 mg total) by mouth at bedtime. SLEEP   No facility-administered encounter medications on file as of 08/07/2019.     Surgical History: Past Surgical History:  Procedure Laterality Date  . CESAREAN SECTION    . TUBAL LIGATION      Medical History: Past Medical History:  Diagnosis Date  . Anxiety   . Bipolar disorder ([place])   . Depression   . Gestational diabetes     Family History: Family History  Problem Relation Age of Onset  . Diabetes Maternal Grandmother   . Cancer - Colon Maternal Grandmother   . Diabetes Paternal Grandmother   . Heart disease Paternal Grandmother   . Alcohol abuse Brother   . Drug abuse Brother     Social History   Socioeconomic History  . Marital status: Married    Spouse name: Gloria Garza  . Number of children: 2  . Years of education: Not on file  . Highest education level: Associate degree: occupational, Hotel manager, or vocational program  Occupational History  . Not on file  Social Needs  . Financial resource strain: Not hard  at all  . Food insecurity    Worry: Never true    Inability: Never true  . Transportation needs    Medical: No    Non-medical: No  Tobacco Use  . Smoking status: Former Smoker    Types: Cigarettes    Quit date: 07/18/2018    Years since quitting: 1.0  . Smokeless tobacco: Never Used  Substance and Sexual Activity  . Alcohol use: Not Currently    Comment: occasional   . Drug use: Never  . Sexual activity: Yes  Lifestyle  . Physical activity    Days per week: 0 days    Minutes per session: 0 min  . Stress: Very much  Relationships  . Social Herbalist on phone: Not on file    Gets together: Not on file    Attends religious service: Never    Active member of club or organization: No    Attends meetings of clubs or organizations: Never    Relationship status: Married  . Intimate partner violence    Fear of current or ex partner: No    Emotionally abused: No    Physically abused: No    Forced sexual activity: No  Other Topics Concern  . Not on file  Social History Narrative  . Not on file      Review of Systems  Constitutional: Negative for chills, fatigue and unexpected weight change.  HENT: Negative for congestion, rhinorrhea, sneezing and sore throat.   Eyes: Negative for photophobia, pain and redness.  Respiratory: Negative for cough, chest tightness and shortness of breath.   Cardiovascular: Negative for chest pain and palpitations.  Gastrointestinal: Negative for abdominal pain, constipation, diarrhea, nausea and vomiting.  Endocrine: Negative.   Genitourinary: Negative for dysuria and frequency.  Musculoskeletal: Negative for arthralgias, back pain, joint swelling and neck pain.  Skin: Negative for rash.  Allergic/Immunologic: Negative.   Neurological: Negative for tremors and numbness.  Hematological: Negative for adenopathy. Does not bruise/bleed easily.  Psychiatric/Behavioral: Negative for behavioral problems and sleep disturbance. The patient  is not nervous/anxious.     Vital Signs: BP 140/89   Pulse (!) 101   Temp (!) 97.1 F (36.2 C)   Resp 16   Ht 5\' 7"  (1.702 m)   Wt 285 lb (129.3 kg)   SpO2 98%   BMI 44.64 kg/m    Physical Exam Vitals signs and nursing note reviewed.  Constitutional:      General: She is not in acute distress.    Appearance: She is well-developed. She is not diaphoretic.  HENT:     Head: Normocephalic and atraumatic.     Mouth/Throat:     Pharynx: No oropharyngeal exudate.  Eyes:     Pupils: Pupils are equal, round, and reactive to light.  Neck:     Musculoskeletal: Normal range of motion and neck supple.     Thyroid: No thyromegaly.     Vascular: No JVD.     Trachea: No tracheal deviation.  Cardiovascular:     Rate and Rhythm: Normal rate and regular rhythm.     Heart sounds: Normal heart sounds. No murmur. No friction rub. No gallop.   Pulmonary:     Effort: Pulmonary effort is normal. No respiratory distress.     Breath sounds: Normal breath sounds. No wheezing or rales.  Chest:     Chest wall: No tenderness.  Abdominal:     Palpations: Abdomen is soft.     Tenderness: There is no abdominal tenderness. There is no guarding.  Musculoskeletal: Normal range of motion.  Lymphadenopathy:     Cervical: No cervical adenopathy.  Skin:    General: Skin is warm and dry.  Neurological:     Mental Status: She is alert and oriented to person, place, and time.     Cranial Nerves: No cranial nerve deficit.  Psychiatric:        Behavior: Behavior normal.        Thought Content: Thought content normal.        Judgment: Judgment normal.    Assessment/Plan: 1. Elevated ALT measurement Appears to be resolving, will follow labs. Pt has stopped drinking alcohol.    2. Encounter for initial prescription of intrauterine contraceptive device (IUD) Pt has heavy menstrual periods, would like to discuss LARC to help regulate her bleeding.   - Ambulatory referral to Obstetrics / Gynecology  3.  Class 3 severe obesity due to excess calories with serious comorbidity and body mass index (BMI) of 40.0 to 44.9 in adult [facility]) Obesity Counseling: Risk Assessment: An assessment of behavioral risk factors was made today and includes lack of exercise sedentary lifestyle, lack of portion control and poor dietary habits.  Risk Modification Advice: She was counseled on portion control guidelines. Restricting daily caloric intake to. . The detrimental long term effects of obesity on her health and  ongoing poor compliance was also discussed with the patient.  4. Viral illness PT will take OTC medications for 48-72 hours.  If uncontroled with OTC meds or if fever or other symptoms develop, will consider antibiotic therapy. Pt will call.    General Counseling: Gloria Garza verbalizes understanding of the findings of todays visit and agrees with plan of treatment. I have discussed any further diagnostic evaluation that may be needed or ordered today. We also reviewed her medications today. she has been encouraged to call the office with any questions or concerns that should arise related to todays visit.    Orders Placed This Encounter  Procedures  . Ambulatory referral to Obstetrics / Gynecology    No orders of the defined types were placed in this encounter.   Time spent: 15 Minutes   This patient was seen by Orson Gear AGNP-C in Collaboration with Dr Lavera Guise as a part of collaborative care agreement     Kendell Bane AGNP-C Internal medicine

## 2019-08-08 ENCOUNTER — Telehealth: Payer: Self-pay | Admitting: Obstetrics & Gynecology

## 2019-08-08 NOTE — Telephone Encounter (Signed)
Scheduled 9/29 with [place] for IUD (unknown type)

## 2019-08-08 NOTE — Telephone Encounter (Signed)
[facility] referring for Encounter for initial prescription of intrauterine contraceptive device (IUD). Called and left voicemail for patient to call back to be schedule

## 2019-08-09 NOTE — Telephone Encounter (Signed)
Noted. Will order to arrive by apt date/time. 

## 2019-08-14 ENCOUNTER — Encounter: Attending: General Surgery | Admitting: Dietician

## 2019-08-14 ENCOUNTER — Encounter: Payer: Self-pay | Admitting: Dietician

## 2019-08-14 ENCOUNTER — Other Ambulatory Visit: Payer: Self-pay

## 2019-08-14 VITALS — Ht 67.0 in | Wt 285.4 lb

## 2019-08-14 DIAGNOSIS — Z6841 Body Mass Index (BMI) 40.0 and over, adult: Secondary | ICD-10-CM | POA: Diagnosis not present

## 2019-08-14 DIAGNOSIS — Z833 Family history of diabetes mellitus: Secondary | ICD-10-CM | POA: Insufficient documentation

## 2019-08-14 DIAGNOSIS — F319 Bipolar disorder, unspecified: Secondary | ICD-10-CM | POA: Diagnosis not present

## 2019-08-14 DIAGNOSIS — Z8249 Family history of ischemic heart disease and other diseases of the circulatory system: Secondary | ICD-10-CM | POA: Diagnosis not present

## 2019-08-14 DIAGNOSIS — Z713 Dietary counseling and surveillance: Secondary | ICD-10-CM | POA: Diagnosis not present

## 2019-08-14 DIAGNOSIS — R7303 Prediabetes: Secondary | ICD-10-CM | POA: Diagnosis not present

## 2019-08-14 NOTE — Patient Instructions (Signed)
   Continue with current healthy eating pattern, great job!  Keep up the walking or other physical activity as well.   Can allow about 15grams or 1 serving of a healthy carb with meals to help with energy.

## 2019-08-14 NOTE — Progress Notes (Signed)
Appt start time: 1100 end time:  1130.  Assessment:   #2 SWL Appointment.   Start Wt at NDES: 285.1lbs Wt: 285.4lbs Ht: 5'7" BMI: 44.7   Learning Readiness:   Change in progress  MEDICATIONS: dibalproex, traZODone  Progress:  Patient reports reduced intake of sodas, trying to drink some water before meals to help with satiety and eat less.   She reports increased work hours making healthy food choices more of a challenge, has eating supper meals later often.   Dietary intake:  Breakfast: boiled eggs with avocado or bacon; Special K cereal Snack: vegetables or yogurt  Lunch: tuna and crackers; salad with cheese and [place]; mini veg platter with cheese Snack: raw veggies; fruit ie strawberries, pineapple, nectarine Dinner: meat + vegetables, occasional starch ie potato, rice in small amounts Snack: none Beverages: water, occasional juice, soda only rarely  Usual physical activity: some exercise on days off; walking in afternoon 2-3 times a week  Diet to Follow: 15-30g carb with meals, emphasize high fiber options              Nutritional Diagnosis:  [place]-3.3 Overweight/obesity related to history of excess calories and physical inactivity as evidenced by patient current BMI of 44.7, following dietary guidelines for continued weight loss prior to bariatric surgery.              Intervention:   . Nutrition counseling for weight loss prior to upcoming bariatric surgery. Marland Kitchen Updated goals with input from patient.  Teaching Method Utilized:  Visual Auditory Hands on  Handouts given during visit include:  Plate planner with food lists  Goals and instructions  Barriers to learning/adherence to lifestyle change: none  Demonstrated degree of understanding via:  Teach Back   Monitoring/Evaluation:  Dietary intake, exercise, and body weight 09/13/19 at 11:00am.

## 2019-08-20 ENCOUNTER — Ambulatory Visit (INDEPENDENT_AMBULATORY_CARE_PROVIDER_SITE_OTHER): Admitting: Obstetrics & Gynecology

## 2019-08-20 ENCOUNTER — Other Ambulatory Visit: Payer: Self-pay

## 2019-08-20 ENCOUNTER — Other Ambulatory Visit (HOSPITAL_COMMUNITY)
Admission: RE | Admit: 2019-08-20 | Discharge: 2019-08-20 | Disposition: A | Discharge status: Home / Self Care | Source: Ambulatory Visit | Attending: Obstetrics & Gynecology | Admitting: Obstetrics & Gynecology

## 2019-08-20 ENCOUNTER — Encounter: Payer: Self-pay | Admitting: Obstetrics & Gynecology

## 2019-08-20 VITALS — BP 140/100 | Ht 67.0 in | Wt 286.0 lb

## 2019-08-20 DIAGNOSIS — N920 Excessive and frequent menstruation with regular cycle: Secondary | ICD-10-CM

## 2019-08-20 DIAGNOSIS — Z124 Encounter for screening for malignant neoplasm of cervix: Secondary | ICD-10-CM | POA: Insufficient documentation

## 2019-08-20 NOTE — Progress Notes (Signed)
Consultant: Orson Gear, NP Reason: Menorrhagia  HPI:      Ms. Gloria Garza is a 38 y.o. [phone] presents today for a problem visit.  She complains of menorrhagia that  began after the CS and BTL of her last child 4 years ago and its severity is described as severe.  She has regular periods every 28 days that last for 7 days with 5 days extremely heavy and they are associated with moderate menstrual cramping.  She has used the following for attempts at control: maxi pad.  Disruptive to function and work, soiling of clothes.  Previous evaluation: none. Prior Diagnosis: menorrhagia. Previous Treatment: NSAID IBF, with no improvement And also Depo and OCPs in past with minimal improvement. Does not desire IUD  She is single partner, contraception - tubal ligation.  Hx of STDs: none. She is premenopausal.  PMHx: She  has a past medical history of Anxiety, Bipolar disorder ([place]), Depression, and Gestational diabetes. Also,  has a past surgical history that includes Cesarean section and Tubal ligation., family history includes Alcohol abuse in her brother; Cancer - Colon in her maternal grandmother; Diabetes in her maternal grandmother and paternal grandmother; Drug abuse in her brother; Heart disease in her paternal grandmother.,  reports that she quit smoking about 13 months ago. Her smoking use included cigarettes. She has never used smokeless tobacco. She reports previous alcohol use. She reports that she does not use drugs.  She  Current Outpatient Medications:  .  divalproex (DEPAKOTE ER) 500 MG 24 hr tablet, Take 3 tablets (1,500 mg total) by mouth daily., Disp: 270 tablet, Rfl: 0 .  traZODone (DESYREL) 100 MG tablet, Take 2 tablets (200 mg total) by mouth at bedtime. SLEEP, Disp: 180 tablet, Rfl: 0  Also, has No Known Allergies.  Review of Systems  Constitutional: Negative for chills, fever and malaise/fatigue.  HENT: Negative for congestion, sinus pain and sore throat.   Eyes:  Negative for blurred vision and pain.  Respiratory: Negative for cough and wheezing.   Cardiovascular: Negative for chest pain and leg swelling.  Gastrointestinal: Negative for abdominal pain, constipation, diarrhea, heartburn, nausea and vomiting.  Genitourinary: Negative for dysuria, frequency, hematuria and urgency.  Musculoskeletal: Positive for joint pain. Negative for back pain, myalgias and neck pain.  Skin: Negative for itching and rash.  Neurological: Negative for dizziness, tremors and weakness.  Endo/Heme/Allergies: Does not bruise/bleed easily.  Psychiatric/Behavioral: Negative for depression. The patient is not nervous/anxious and does not have insomnia.     Objective: BP (!) 140/100   Ht 5\' 7"  (1.702 m)   Wt 286 lb (129.7 kg)   BMI 44.79 kg/m  Physical Exam Constitutional:      General: She is not in acute distress.    Appearance: She is well-developed. She is obese.  Genitourinary:     Pelvic exam was performed with patient supine.     Vagina, uterus and rectum normal.     No lesions in the vagina.     No vaginal bleeding.     No cervical motion tenderness, friability, lesion or polyp.     Uterus is mobile.     Uterus is not enlarged.     No uterine mass detected.    Uterus is midaxial.     No right or left adnexal mass present.     Right adnexa not tender.     Left adnexa not tender.     Genitourinary Comments: Exam of uterus, adnexa limited by pt obesity  HENT:     Head: Normocephalic and atraumatic. No laceration.     Right Ear: Hearing normal.     Left Ear: Hearing normal.     Mouth/Throat:     Pharynx: Uvula midline.  Eyes:     Pupils: Pupils are equal, round, and reactive to light.  Neck:     Musculoskeletal: Normal range of motion and neck supple.     Thyroid: No thyromegaly.  Cardiovascular:     Rate and Rhythm: Normal rate and regular rhythm.     Heart sounds: No murmur. No friction rub. No gallop.   Pulmonary:     Effort: Pulmonary effort is  normal. No respiratory distress.     Breath sounds: Normal breath sounds. No wheezing.  Chest:     Breasts:        Right: No mass, skin change or tenderness.        Left: No mass, skin change or tenderness.  Abdominal:     General: Bowel sounds are normal. There is no distension.     Palpations: Abdomen is soft.     Tenderness: There is no abdominal tenderness. There is no rebound.  Musculoskeletal: Normal range of motion.  Neurological:     Mental Status: She is alert and oriented to person, place, and time.     Cranial Nerves: No cranial nerve deficit.  Skin:    General: Skin is warm and dry.  Psychiatric:        Judgment: Judgment normal.  Vitals signs reviewed.     ASSESSMENT/PLAN:  menorrhagia  Problem List Items Addressed This Visit    Menorrhagia with regular cycle    -  Primary   Screening for cervical cancer        Patient has abnormal uterine bleeding . She has a normal exam today, with no evidence of lesions.  Evaluation includes the following: exam, labs such as hormonal testing, and pelvic ultrasound to evaluate for any structural gynecologic abnormalities.  Patient to follow up after testing.  Treatment option for menorrhagia or menometrorrhagia discussed in great detail with the patient.  Options include hormonal therapy, IUD therapy such as Mirena, D&C, Ablation, and Hysterectomy.  The pros and cons of each option discussed with patient.  PAP today  Barnett Applebaum, MD, [facility], [facility] 08/20/2019  2:03 PM

## 2019-08-20 NOTE — Patient Instructions (Addendum)
Transvaginal Ultrasound A transvaginal ultrasound, also called an endovaginal ultrasound, is a test that uses sound waves to take pictures of the female genital tract. The pictures are taken with a device, called a transducer, that is placed in the vagina. This test may be done to:  Check for problems with your pregnancy.  Check your developing baby.  Check for anything abnormal in the uterus or ovaries.  Find out why you have pelvic pain or bleeding. Tell a health care provider about:  Any allergies you have.  All medicines you are taking, including vitamins, herbs, eye drops, creams, and over-the-counter medicines.  Any blood disorders you have.  Any surgeries you have had.  Any medical conditions you have.  Whether you are pregnant or may be pregnant.  Whether you are having your menstrual period. What are the risks? This is a safe procedure. There are no known risks or complications of having this test. What happens before the procedure? This procedure needs to be done when your bladder is empty. Follow your health care provider's instructions about drinking fluids and emptying your bladder before the test. What happens during the procedure?   You will empty your bladder before the procedure.  You will undress from the waist down.  You will lie down on an exam table, with your knees bent and your feet in foot holders.  A health care provider will cover the transducer with a sterile cover.  A gel will be put on the transducer. The gel helps transmit the sound waves and prevents irritation of your vagina.  The technician will insert the transducer into your vagina to get images. These will be displayed on a monitor that looks like a small television screen.  The transducer will be removed when the procedure is complete. The procedure may vary among health care providers and hospitals. What happens after the procedure?  It is up to you to get the results of your  procedure. Ask your health care provider, or the department that is doing the procedure, when your results will be ready.  Keep all follow-up visits as told by your health care provider. This is important. Summary  A transvaginal ultrasound, also called an endovaginal ultrasound, is a test that uses sound waves to take pictures of the female genital tract.  This is a safe procedure. There are no known risks associated with this test.  The procedure needs to be done when your bladder is empty. Follow your health care provider's instructions about drinking fluids and emptying your bladder before the test.  During the procedure, you will undress from the waist down and lie down on an exam table. A technician will insert a transducer into your vagina to obtain images.  Ask your health care provider, or the department that is doing the procedure, when your results will be ready. This information is not intended to replace advice given to you by your health care provider. Make sure you discuss any questions you have with your health care provider. Document Released: 10/19/2004 Document Revised: 06/20/2018 Document Reviewed: 06/20/2018 Elsevier Patient Education  2020 [place].   Endometrial Ablation Endometrial ablation is a procedure that destroys the thin inner layer of the lining of the uterus (endometrium). This procedure may be done:  To stop heavy periods.  To stop bleeding that is causing anemia.  To control irregular bleeding.  To treat bleeding caused by small tumors (fibroids) in the endometrium. This procedure is often an alternative to major surgery, such  as removal of the uterus and cervix (hysterectomy). As a result of this procedure:  You may not be able to have children. However, if you are premenopausal (you have not gone through menopause): ? You may still have a small chance of getting pregnant. ? You will need to use a reliable method of birth control after the  procedure to prevent pregnancy.  You may stop having a menstrual period, or you may have only a small amount of bleeding during your period. Menstruation may return several years after the procedure. Tell a health care provider about:  Any allergies you have.  All medicines you are taking, including vitamins, herbs, eye drops, creams, and over-the-counter medicines.  Any problems you or family members have had with the use of anesthetic medicines.  Any blood disorders you have.  Any surgeries you have had.  Any medical conditions you have. What are the risks? Generally, this is a safe procedure. However, problems may occur, including:  A hole (perforation) in the uterus or bowel.  Infection of the uterus, bladder, or vagina.  Bleeding.  Damage to other structures or organs.  An air bubble in the lung (air embolus).  Problems with pregnancy after the procedure.  Failure of the procedure.  Decreased ability to diagnose cancer in the endometrium. What happens before the procedure?  You will have tests of your endometrium to make sure there are no pre-cancerous cells or cancer cells present.  You may have an ultrasound of the uterus.  You may be given medicines to thin the endometrium.  Ask your health care provider about: ? Changing or stopping your regular medicines. This is especially important if you take diabetes medicines or blood thinners. ? Taking medicines such as aspirin and ibuprofen. These medicines can thin your blood. Do not take these medicines before your procedure if your doctor tells you not to.  Plan to have someone take you home from the hospital or clinic. What happens during the procedure?   You will lie on an exam table with your feet and legs supported as in a pelvic exam.  To lower your risk of infection: ? Your health care team will wash or sanitize their hands and put on germ-free (sterile) gloves. ? Your genital area will be washed with  soap.  An IV tube will be inserted into one of your veins.  You will be given a medicine to help you relax (sedative).  A surgical instrument with a light and camera (resectoscope) will be inserted into your vagina and moved into your uterus. This allows your surgeon to see inside your uterus.  Endometrial tissue will be removed using one of the following methods: ? Radiofrequency. This method uses a radiofrequency-alternating electric current to remove the endometrium. ? Cryotherapy. This method uses extreme cold to freeze the endometrium. ? Heated-free liquid. This method uses a heated saltwater (saline) solution to remove the endometrium. ? Microwave. This method uses high-energy microwaves to heat up the endometrium and remove it. ? Thermal balloon. This method involves inserting a catheter with a balloon tip into the uterus. The balloon tip is filled with heated fluid to remove the endometrium. The procedure may vary among health care providers and hospitals. What happens after the procedure?  Your blood pressure, heart rate, breathing rate, and blood oxygen level will be monitored until the medicines you were given have worn off.  As tissue healing occurs, you may notice vaginal bleeding for 4-6 weeks after the procedure. You may also  experience: ? Cramps. ? Thin, watery vaginal discharge that is light pink or brown in color. ? A need to urinate more frequently than usual. ? Nausea.  Do not drive for 24 hours if you were given a sedative.  Do not have sex or insert anything into your vagina until your health care provider approves. Summary  Endometrial ablation is done to treat the many causes of heavy menstrual bleeding.  The procedure may be done only after medications have been tried to control the bleeding.  Plan to have someone take you home from the hospital or clinic. This information is not intended to replace advice given to you by your health care provider. Make  sure you discuss any questions you have with your health care provider. Document Released: 09/16/2004 Document Revised: 04/24/2018 Document Reviewed: 11/24/2016 Elsevier Patient Education  2020 [place].   Total Laparoscopic Hysterectomy A total laparoscopic hysterectomy is a minimally invasive surgery to remove the uterus and cervix. The fallopian tubes and ovaries can also be removed (bilateral salpingo-oophorectomy) during this surgery, if necessary. This procedure may be done to treat problems such as:  Noncancerous growths in the uterus (uterine fibroids) that cause symptoms.  A condition that causes the lining of the uterus (endometrium) to grow in other areas (endometriosis).  Problems with pelvic support. This is caused by weakened muscles of the pelvis following vaginal childbirth or menopause.  Cancer of the cervix, ovaries, uterus, or endometrium.  Excessive (dysfunctional) uterine bleeding. This surgery is performed by inserting a thin, lighted tube (laparoscope) and surgical instruments into small incisions in the abdomen. The laparoscope sends images to a monitor. The images help the health care provider perform the procedure. After this procedure, you will no longer be able to have a baby, and you will no longer have a menstrual period. Tell a health care provider about:  Any allergies you have.  All medicines you are taking, including vitamins, herbs, eye drops, creams, and over-the-counter medicines.  Any problems you or family members have had with anesthetic medicines.  Any blood disorders you have.  Any surgeries you have had.  Any medical conditions you have.  Whether you are pregnant or may be pregnant. What are the risks? Generally, this is a safe procedure. However, problems may occur, including:  Infection.  Bleeding.  Blood clots in the legs or lungs.  Allergic reactions to medicines.  Damage to other structures or organs.  The risk that  the surgery may have to be switched to the regular one in which a large incision is made in the abdomen (abdominal hysterectomy). What happens before the procedure? Staying hydrated Follow instructions from your health care provider about hydration, which may include:  Up to 2 hours before the procedure - you may continue to drink clear liquids, such as water, clear fruit juice, black coffee, and plain tea Eating and drinking restrictions Follow instructions from your health care provider about eating and drinking, which may include:  8 hours before the procedure - stop eating heavy meals or foods such as meat, fried foods, or fatty foods.  6 hours before the procedure - stop eating light meals or foods, such as toast or cereal.  6 hours before the procedure - stop drinking milk or drinks that contain milk.  2 hours before the procedure - stop drinking clear liquids. Medicines  Ask your health care provider about: ? Changing or stopping your regular medicines. This is especially important if you are taking diabetes medicines or  blood thinners. ? Taking over-the-counter medicines, vitamins, herbs, and supplements. ? Taking medicines such as aspirin and ibuprofen. These medicines can thin your blood. Do not take these medicines unless your health care provider tells you to take them.  You may be given antibiotic medicine to help prevent infection.  You may be asked to take laxatives.  You may be given medicines to help prevent nausea and vomiting after the procedure. General instructions  Ask your health care provider how your surgical site will be marked or identified.  You may be asked to shower with a germ-killing soap.  Do not use any products that contain nicotine or tobacco, such as cigarettes and e-cigarettes. If you need help quitting, ask your health care provider.  You may have an exam or testing, such as an ultrasound to determine the size and shape of your pelvic organs.   You may have a blood or urine sample taken.  This procedure can affect the way you feel about yourself. Talk with your health care provider about the physical and emotional changes hysterectomy may cause.  Plan to have someone take you home from the hospital or clinic.  Plan to have a responsible adult care for you for at least 24 hours after you leave the hospital or clinic. This is important. What happens during the procedure?  To lower your risk of infection: ? Your health care team will wash or sanitize their hands. ? Your skin will be washed with soap. ? Hair may be removed from the surgical area.  An IV will be inserted into one of your veins.  You will be given one or more of the following: ? A medicine to help you relax (sedative). ? A medicine to make you fall asleep (general anesthetic).  You will be given antibiotic medicine through your IV.  A tube may be inserted down your throat to help you breathe during the procedure.  A gas (carbon dioxide) will be used to inflate your abdomen to allow your surgeon to see inside of your abdomen.  Three or four small incisions will be made in your abdomen.  A laparoscope will be inserted into one of your incisions. Surgical instruments will be inserted through the other incisions in order to perform the procedure.  Your uterus and cervix may be removed through your vagina or cut into small pieces and removed through the small incisions. Any other organs that need to be removed will also be removed this way.  Carbon dioxide will be released from inside of your abdomen.  Your incisions will be closed with stitches (sutures).  A bandage (dressing) may be placed over your incisions. The procedure may vary among health care providers and hospitals. What happens after the procedure?  Your blood pressure, heart rate, breathing rate, and blood oxygen level will be monitored until the medicines you were given have worn off.  You  will be given medicine for pain and nausea as needed.  Do not drive for 24 hours if you received a sedative. Summary  Total Laparoscopic hysterectomy is a procedure to remove your uterus, cervix and sometimes the fallopian tubes and ovaries.  This procedure can affect the way you feel about yourself. Talk with your health care provider about the physical and emotional changes hysterectomy may cause.  After this procedure, you will no longer be able to have a baby, and you will no longer have a menstrual period.  You will be given pain medicine to control discomfort after  this procedure. This information is not intended to replace advice given to you by your health care provider. Make sure you discuss any questions you have with your health care provider. Document Released: 09/04/2007 Document Revised: 10/20/2017 Document Reviewed: 01/18/2017 Elsevier Patient Education  2020 [employer].

## 2019-08-26 LAB — CYTOLOGY - PAP
Chlamydia: NEGATIVE
Diagnosis: NEGATIVE
High risk HPV: POSITIVE — AB
Neisseria Gonorrhea: NEGATIVE
Trichomonas: NEGATIVE

## 2019-09-06 ENCOUNTER — Encounter: Payer: Self-pay | Admitting: Obstetrics & Gynecology

## 2019-09-06 ENCOUNTER — Ambulatory Visit (INDEPENDENT_AMBULATORY_CARE_PROVIDER_SITE_OTHER): Admitting: Obstetrics & Gynecology

## 2019-09-06 ENCOUNTER — Other Ambulatory Visit: Payer: Self-pay

## 2019-09-06 ENCOUNTER — Ambulatory Visit (INDEPENDENT_AMBULATORY_CARE_PROVIDER_SITE_OTHER)

## 2019-09-06 ENCOUNTER — Other Ambulatory Visit: Payer: Self-pay | Admitting: Obstetrics & Gynecology

## 2019-09-06 VITALS — BP 148/90 | Ht 67.0 in | Wt 294.0 lb

## 2019-09-06 DIAGNOSIS — N920 Excessive and frequent menstruation with regular cycle: Secondary | ICD-10-CM | POA: Diagnosis not present

## 2019-09-06 DIAGNOSIS — N8302 Follicular cyst of left ovary: Secondary | ICD-10-CM | POA: Diagnosis not present

## 2019-09-06 DIAGNOSIS — N8301 Follicular cyst of right ovary: Secondary | ICD-10-CM

## 2019-09-06 NOTE — Patient Instructions (Signed)
Total Laparoscopic Hysterectomy A total laparoscopic hysterectomy is a minimally invasive surgery to remove the uterus and cervix. The fallopian tubes and ovaries can also be removed (bilateral salpingo-oophorectomy) during this surgery, if necessary. This procedure may be done to treat problems such as:  Noncancerous growths in the uterus (uterine fibroids) that cause symptoms.  A condition that causes the lining of the uterus (endometrium) to grow in other areas (endometriosis).  Problems with pelvic support. This is caused by weakened muscles of the pelvis following vaginal childbirth or menopause.  Cancer of the cervix, ovaries, uterus, or endometrium.  Excessive (dysfunctional) uterine bleeding. This surgery is performed by inserting a thin, lighted tube (laparoscope) and surgical instruments into small incisions in the abdomen. The laparoscope sends images to a monitor. The images help the health care provider perform the procedure. After this procedure, you will no longer be able to have a baby, and you will no longer have a menstrual period. Tell a health care provider about:  Any allergies you have.  All medicines you are taking, including vitamins, herbs, eye drops, creams, and over-the-counter medicines.  Any problems you or family members have had with anesthetic medicines.  Any blood disorders you have.  Any surgeries you have had.  Any medical conditions you have.  Whether you are pregnant or may be pregnant. What are the risks? Generally, this is a safe procedure. However, problems may occur, including:  Infection.  Bleeding.  Blood clots in the legs or lungs.  Allergic reactions to medicines.  Damage to other structures or organs.  The risk that the surgery may have to be switched to the regular one in which a large incision is made in the abdomen (abdominal hysterectomy). What happens before the procedure? Staying hydrated Follow instructions from your  health care provider about hydration, which may include:  Up to 2 hours before the procedure - you may continue to drink clear liquids, such as water, clear fruit juice, black coffee, and plain tea Eating and drinking restrictions Follow instructions from your health care provider about eating and drinking, which may include:  8 hours before the procedure - stop eating heavy meals or foods such as meat, fried foods, or fatty foods.  6 hours before the procedure - stop eating light meals or foods, such as toast or cereal.  6 hours before the procedure - stop drinking milk or drinks that contain milk.  2 hours before the procedure - stop drinking clear liquids. Medicines  Ask your health care provider about: ? Changing or stopping your regular medicines. This is especially important if you are taking diabetes medicines or blood thinners. ? Taking over-the-counter medicines, vitamins, herbs, and supplements. ? Taking medicines such as aspirin and ibuprofen. These medicines can thin your blood. Do not take these medicines unless your health care provider tells you to take them.  You may be given antibiotic medicine to help prevent infection.  You may be asked to take laxatives.  You may be given medicines to help prevent nausea and vomiting after the procedure. General instructions  Ask your health care provider how your surgical site will be marked or identified.  You may be asked to shower with a germ-killing soap.  Do not use any products that contain nicotine or tobacco, such as cigarettes and e-cigarettes. If you need help quitting, ask your health care provider.  You may have an exam or testing, such as an ultrasound to determine the size and shape of your pelvic organs.    You may have a blood or urine sample taken.  This procedure can affect the way you feel about yourself. Talk with your health care provider about the physical and emotional changes hysterectomy may  cause.  Plan to have someone take you home from the hospital or clinic.  Plan to have a responsible adult care for you for at least 24 hours after you leave the hospital or clinic. This is important. What happens during the procedure?  To lower your risk of infection: ? Your health care team will wash or sanitize their hands. ? Your skin will be washed with soap. ? Hair may be removed from the surgical area.  An IV will be inserted into one of your veins.  You will be given one or more of the following: ? A medicine to help you relax (sedative). ? A medicine to make you fall asleep (general anesthetic).  You will be given antibiotic medicine through your IV.  A tube may be inserted down your throat to help you breathe during the procedure.  A gas (carbon dioxide) will be used to inflate your abdomen to allow your surgeon to see inside of your abdomen.  Three or four small incisions will be made in your abdomen.  A laparoscope will be inserted into one of your incisions. Surgical instruments will be inserted through the other incisions in order to perform the procedure.  Your uterus and cervix may be removed through your vagina or cut into small pieces and removed through the small incisions. Any other organs that need to be removed will also be removed this way.  Carbon dioxide will be released from inside of your abdomen.  Your incisions will be closed with stitches (sutures).  A bandage (dressing) may be placed over your incisions. The procedure may vary among health care providers and hospitals. What happens after the procedure?  Your blood pressure, heart rate, breathing rate, and blood oxygen level will be monitored until the medicines you were given have worn off.  You will be given medicine for pain and nausea as needed.  Do not drive for 24 hours if you received a sedative. Summary  Total Laparoscopic hysterectomy is a procedure to remove your uterus, cervix and  sometimes the fallopian tubes and ovaries.  This procedure can affect the way you feel about yourself. Talk with your health care provider about the physical and emotional changes hysterectomy may cause.  After this procedure, you will no longer be able to have a baby, and you will no longer have a menstrual period.  You will be given pain medicine to control discomfort after this procedure. This information is not intended to replace advice given to you by your health care provider. Make sure you discuss any questions you have with your health care provider. Document Released: 09/04/2007 Document Revised: 10/20/2017 Document Reviewed: 01/18/2017 Elsevier Patient Education  2020 Elsevier Inc.  

## 2019-09-06 NOTE — Progress Notes (Signed)
HPI: Pt has been having heavy prolonged periods despite medical therapy.  Prior BTL, no fertility desired.  [place] today.  Desires surgical treatment.  Ultrasound demonstrates no masses seen  PMHx: She  has a past medical history of Anxiety, Bipolar disorder ([place]), Depression, and Gestational diabetes. Also,  has a past surgical history that includes Cesarean section and Tubal ligation., family history includes Alcohol abuse in her brother; Cancer - Colon in her maternal grandmother; Diabetes in her maternal grandmother and paternal grandmother; Drug abuse in her brother; Heart disease in her paternal grandmother.,  reports that she quit smoking about 13 months ago. Her smoking use included cigarettes. She has never used smokeless tobacco. She reports previous alcohol use. She reports that she does not use drugs.  She has a current medication list which includes the following prescription(s): divalproex and trazodone. Also, has No Known Allergies.  Review of Systems  All other systems reviewed and are negative.   Objective: BP (!) 148/90   Ht 5\' 7"  (1.702 m)   Wt 294 lb (133.4 kg)   LMP 08/30/2019   BMI 46.05 kg/m   Physical examination Constitutional NAD, Conversant  Skin No rashes, lesions or ulceration.   Extremities: Moves all appropriately.  Normal ROM for age. No lymphadenopathy.  Neuro: Grossly intact  Psych: Oriented to PPT.  Normal mood. Normal affect.   US Pelvis Transvaginal Non-ob (tv Only)  Result Date: 09/06/2019 Patient Name: Gloria Garza DOB: 02-22-1981 MRN: [id number] ULTRASOUND REPORT Location: [facility] Date of Service: 09/06/2019 Indications: Menorrhagia Findings: The uterus is anteflexed and measures 9.0 x 5.1 x 4.7cm. Echo texture is homogenous without evidence of focal masses. The Endometrium measures 7.1 mm. Right Ovary measures 2.7 x 1.8 x 2.0 cm. Left Ovary measures 3.8 x 2.0 x 1.9 cm. Multiple small peripheral follicles bilaterally. Possible PCOS. Survey of  the adnexa demonstrates no adnexal masses. There is no free fluid in the cul de sac. Impression: 1. Possible PCOS, otherwise normal gyn ultrasound. Recommendations: 1.Clinical correlation with the patient's History and Physical Exam. Vita Barley, RT Review of ULTRASOUND.    I have personally reviewed images and report of recent ultrasound done at [facility]    Plan of management to be discussed with patient. Barnett Applebaum, MD, [facility], [facility] 09/06/2019  11:23 AM    Assessment:  Menorrhagia with regular cycle Treatment option for menorrhagia or menometrorrhagia discussed in great detail with the patient.  Options include hormonal therapy, IUD therapy such as Mirena, D&C, Ablation, and Hysterectomy.  The pros and cons of each option discussed with patient.  She prefers hysterectomy  I have had a careful discussion with this patient about all the options available and the risk/benefits of each. I have fully informed this patient that a hysterectomy may subject her to a variety of discomforts and risks: She understands that most patients have surgery with little difficulty, but problems can happen ranging from minor to fatal. These include nausea, vomiting, pain, bleeding, infection, poor healing, hernia, wound separation, vaginal cuff separation or formation of adhesions. Unexpected reactions may occur from any drug or anesthetic given. Unintended injury may occur to other pelvic or abdominal structures such as Fallopian tubes, ovaries, bladder, ureter (tube from kidney to bladder), or bowel. Nerves going from the pelvis to the legs may be injured. Any such injury may require immediate or later additional surgery to correct the problem. Excessive blood loss requiring transfusion is very unlikely but possible. Dangerous blood clots may  form in the legs or lungs. Physical and sexual activity will be restricted in varying degrees for an indeterminate period of time but most often  2-4 weeks. She understands that the plan is to do this laparoscopically, however, there is a chance that this will need to be performed via a larger incision. She may be hospitalized overnight. Finally, she understands that it is impossible to list every possible undesirable effect and that the condition for which surgery is done is not always cured or significantly improved, and in rare cases may be even worse. I have also counseled her extensively about the pros and cons of ovarian conservation versus removal. Ample time was given to answer all questions.  A total of 15 minutes were spent face-to-face with the patient during this encounter and over half of that time dealt with counseling and coordination of care.  Barnett Applebaum, MD, [facility], [facility] 09/06/2019  11:26 AM

## 2019-09-09 ENCOUNTER — Telehealth: Payer: Self-pay | Admitting: Obstetrics & Gynecology

## 2019-09-09 NOTE — Telephone Encounter (Signed)
Pt aware.

## 2019-09-09 NOTE — Telephone Encounter (Signed)
-----   Message from Gae Dry, MD sent at 09/06/2019 11:25 AM EDT ----- Regarding: SURGERY Surgery Booking Request Patient Full Name:  Gloria Garza  MRN: [id number]  DOB: 1981/03/27  Surgeon: Hoyt Koch, MD  Requested Surgery Date and Time: NOV 5 or 12 Primary Diagnosis AND Code: Menorrhagia N92.0 Secondary Diagnosis and Code:  Surgical Procedure: TLH/BS L&D Notification: No Admission Status: same day surgery Length of Surgery: 1 hr Special Case Needs: No H&P: Yes Phone Interview???:  Yes Interpreter: No Language:  Medical Clearance:  No Special Scheduling Instructions: none Any known health/anesthesia issues, diabetes, sleep apnea, latex allergy, defibrillator/pacemaker?: No Acuity: P3   (P1 highest, P2 delay may cause harm, P3 low, elective gyn, P4 lowest)

## 2019-09-09 NOTE — Telephone Encounter (Signed)
Patient is aware of H&P on 09/16/19 @ 11am w/ Dr. Kenton Kingfisher, Pre-admit Testing phone interview to be scheduled, COVID testing on 112/20, and OR on 09/26/19. Patient is aware she will be asked to quarantine after COVID testing. Patient is aware she may receive calls from the [facility]. Patient said she is starting a new job on Monday and needs to know the recovery time.

## 2019-09-10 ENCOUNTER — Other Ambulatory Visit: Payer: Self-pay

## 2019-09-10 ENCOUNTER — Encounter: Payer: Self-pay | Admitting: Psychiatry

## 2019-09-10 ENCOUNTER — Ambulatory Visit (INDEPENDENT_AMBULATORY_CARE_PROVIDER_SITE_OTHER): Admitting: Psychiatry

## 2019-09-10 DIAGNOSIS — F3177 Bipolar disorder, in partial remission, most recent episode mixed: Secondary | ICD-10-CM

## 2019-09-10 DIAGNOSIS — F5101 Primary insomnia: Secondary | ICD-10-CM

## 2019-09-10 DIAGNOSIS — F17200 Nicotine dependence, unspecified, uncomplicated: Secondary | ICD-10-CM

## 2019-09-10 DIAGNOSIS — F411 Generalized anxiety disorder: Secondary | ICD-10-CM

## 2019-09-10 DIAGNOSIS — F40298 Other specified phobia: Secondary | ICD-10-CM

## 2019-09-10 MED ORDER — TRAZODONE HCL 100 MG PO TABS: 200.0000 mg | ORAL_TABLET | Freq: Every day | ORAL | 0 refills | Status: DC

## 2019-09-10 MED ORDER — DIVALPROEX SODIUM ER 500 MG PO TB24: 1500.0000 mg | ORAL_TABLET | Freq: Every day | ORAL | 0 refills | Status: DC

## 2019-09-10 NOTE — Progress Notes (Signed)
Virtual Visit via Video Note  I connected with Gloria Garza on 09/10/19 at 10:30 AM EDT by a video enabled telemedicine application and verified that I am speaking with the correct person using two identifiers.   I discussed the limitations of evaluation and management by telemedicine and the availability of in person appointments. The patient expressed understanding and agreed to proceed.   I discussed the assessment and treatment plan with the patient. The patient was provided an opportunity to ask questions and all were answered. The patient agreed with the plan and demonstrated an understanding of the instructions.   The patient was advised to call back or seek an in-person evaluation if the symptoms worsen or if the condition fails to improve as anticipated. [place] MD OP Progress Note  09/10/2019 10:50 AM Gloria Garza  MRN:  [id number]  Chief Complaint:  Chief Complaint    Follow-up     HPI: Gloria Garza is a 38 year old Caucasian female, has a history of bipolar disorder, GAD, lives in [place], married was evaluated by telemedicine today.  Patient today reports she is currently stable on the current medication regimen.  She denies any significant mood lability.  She denies any anxiety or depression.  She reports she has been compliant with her medications.  Denies side effects.  She reports sleep is good.  She continues to take trazodone.  She denies any suicidality, homicidality or perceptual disturbances.  She reports she is currently working at [employer].  She reports work keeps her busy.  Patient has an upcoming hysterectomy coming up, she reports her wife is there to support her.  She denies any other concerns today. Visit Diagnosis:    ICD-10-CM   1. Bipolar disorder, in partial remission, most recent episode mixed (HCC)  F31.77 divalproex (DEPAKOTE ER) 500 MG 24 hr tablet  2. Primary insomnia  F51.01 traZODone (DESYREL) 100 MG tablet  3. GAD (generalized anxiety disorder)   F41.1   4. Tobacco use disorder  F17.200   5. 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  Past trials of Zoloft.  Past Medical History:  Past Medical History:  Diagnosis Date  . Anxiety   . Bipolar disorder ([place])   . Depression   . Gestational diabetes     Past Surgical History:  Procedure Laterality Date  . CESAREAN SECTION    . TUBAL LIGATION      Family Psychiatric History: I have reviewed family psychiatric history from my progress note on 12/18/2018.  Family History:  Family History  Problem Relation Age of Onset  . Diabetes Maternal Grandmother   . Cancer - Colon Maternal Grandmother   . Diabetes Paternal Grandmother   . Heart disease Paternal Grandmother   . Alcohol abuse Brother   . Drug abuse Brother     Social History: I have reviewed social history from my progress note on 12/18/2018. Social History   Socioeconomic History  . Marital status: Married    Spouse name: terah  . Number of children: 2  . Years of education: Not on file  . Highest education level: Associate degree: occupational, Hotel manager, or vocational program  Occupational History  . Not on file  Social Needs  . Financial resource strain: Not hard at all  . Food insecurity    Worry: Never true    Inability: Never true  . Transportation needs    Medical: No    Non-medical:  No  Tobacco Use  . Smoking status: Former Smoker    Types: Cigarettes    Quit date: 07/18/2018    Years since quitting: 1.1  . Smokeless tobacco: Never Used  Substance and Sexual Activity  . Alcohol use: Not Currently    Comment: none recently  . Drug use: Never  . Sexual activity: Yes    Birth control/protection: Surgical  Lifestyle  . Physical activity    Days per week: 0 days    Minutes per session: 0 min  . Stress: Very much  Relationships  . Social Herbalist on phone: Not on file    Gets together: Not on  file    Attends religious service: Never    Active member of club or organization: No    Attends meetings of clubs or organizations: Never    Relationship status: Married  Other Topics Concern  . Not on file  Social History Narrative  . Not on file    Allergies: No Known Allergies  Metabolic Disorder Labs: Lab Results  Component Value Date   HGBA1C 6.0 (H) 12/31/2018   Lab Results  Component Value Date   PROLACTIN 14.9 12/31/2018   Lab Results  Component Value Date   CHOL 200 (H) 12/31/2018   TRIG 262 (H) 12/31/2018   HDL 48 12/31/2018   LDLCALC 100 (H) 12/31/2018   Lab Results  Component Value Date   TSH 1.340 10/29/2018    Therapeutic Level Labs: No results found for: LITHIUM Lab Results  Component Value Date   VALPROATE 44 (L) 06/05/2019   VALPROATE 49 (L) 05/22/2019   No components found for:  CBMZ  Current Medications: Current Outpatient Medications  Medication Sig Dispense Refill  . divalproex (DEPAKOTE ER) 500 MG 24 hr tablet Take 3 tablets (1,500 mg total) by mouth daily. 270 tablet 0  . traZODone (DESYREL) 100 MG tablet Take 2 tablets (200 mg total) by mouth at bedtime. SLEEP 180 tablet 0   No current facility-administered medications for this visit.      Musculoskeletal: Strength & Muscle Tone: within normal limits Gait & Station: normal Patient leans: N/A  Psychiatric Specialty Exam: Review of Systems  Psychiatric/Behavioral: Negative for depression, hallucinations, substance abuse and suicidal ideas. The patient is not nervous/anxious.   All other systems reviewed and are negative.   Last menstrual period 08/30/2019.There is no height or weight on file to calculate BMI.  General Appearance: Casual  Eye Contact:  Fair  Speech:  Clear and Coherent  Volume:  Normal  Mood:  Anxious improving  Affect:  Appropriate  Thought Process:  Goal Directed and Descriptions of Associations: Intact  Orientation:  Full (Time, Place, and Person)   Thought Content: Logical   Suicidal Thoughts:  No  Homicidal Thoughts:  No  Memory:  Immediate;   Fair Recent;   Fair Remote;   Fair  Judgement:  Fair  Insight:  Fair  Psychomotor Activity:  Normal  Concentration:  Concentration: Fair and Attention Span: Fair  Recall:  [employer] of Knowledge: Fair  Language: Fair  Akathisia:  No  Handed:  Right  AIMS (if indicated):denies tremors, rigidity  Assets:  Communication Skills Desire for Improvement Social Support  ADL's:  Intact  Cognition: WNL  Sleep:  Fair   Screenings: PHQ2-9     Office Visit from 07/17/2019 in [facility], [facility] Nutrition from 06/11/2019 in [facility] Office Visit from 01/16/2019 in [facility], [facility] Office Visit  from 12/20/2018 in [facility], [facility] Office Visit from 09/17/2018 in [facility], [facility]  PHQ-2 Total Score  0  0  0  1  0       Assessment and Plan: Gloria Garza is a 38 year old Caucasian female, has a history of bipolar disorder currently in partial remission, anxiety disorder was evaluated by telemedicine today.  She is biologically predisposed given her history of trauma.  She also has psychosocial stressors of several deaths in the family, current pandemic.  Patient is currently making progress on the current medication regimen.  Plan as noted below.  Plan Bipolar disorder in partial remission Depakote ER 1500 mg p.o. daily Depakote level on 06/05/2019 -44-subtherapeutic.  For insomnia-stable Trazodone 100 to 200 mg p.o. nightly.   GAD-stable Continue psychotherapy sessions as needed  Bereavement-stable Continue CBT  Specific phobia-making progress- (flying ) Continue CBT  Tobacco use disorder-unstable She is not ready to quit.  Follow-up in clinic in 2 months or sooner if needed.  January 6 at 10:30 AM  I have spent atleast 15 minutes non  face to face with patient today. More than 50 % of the time was spent for  psychoeducation and supportive psychotherapy and care coordination. This note was generated in part or whole with voice recognition software. Voice recognition is usually quite accurate but there are transcription errors that can and very often do occur. I apologize for any typographical errors that were not detected and corrected.      Ursula Alert, MD 09/10/2019, 10:50 AM

## 2019-09-12 ENCOUNTER — Other Ambulatory Visit: Payer: Self-pay

## 2019-09-12 ENCOUNTER — Ambulatory Visit (INDEPENDENT_AMBULATORY_CARE_PROVIDER_SITE_OTHER): Admitting: Obstetrics & Gynecology

## 2019-09-12 ENCOUNTER — Encounter: Payer: Self-pay | Admitting: Obstetrics & Gynecology

## 2019-09-12 VITALS — BP 120/80 | Ht 67.0 in | Wt 290.0 lb

## 2019-09-12 DIAGNOSIS — N920 Excessive and frequent menstruation with regular cycle: Secondary | ICD-10-CM | POA: Diagnosis not present

## 2019-09-12 NOTE — Progress Notes (Signed)
PRE-OPERATIVE HISTORY AND PHYSICAL EXAM  HPI:  Gloria Garza is a 38 y.o. [id number] Patient's last menstrual period was 08/30/2019.; she is being admitted for surgery related to abnormal uterine bleeding.  She complains of menorrhagia that  began after the CS and BTL of her last child 4 years ago and its severity is described as severe.  She has regular periods every 28 days that last for 7 days with 5 days extremely heavy and they are associated with moderate menstrual cramping.  She has used the following for attempts at control: maxi pad.  Disruptive to function and work, soiling of clothes.  Previous evaluation: [place], normal. Prior Diagnosis: menorrhagia. Previous Treatment: NSAID IBF, with no improvement And also Depo and OCPs in past with minimal improvement. Does not desire IUD  PMHx: Past Medical History:  Diagnosis Date  . Anxiety   . Bipolar disorder ([place])   . Depression   . Gestational diabetes    Past Surgical History:  Procedure Laterality Date  . CESAREAN SECTION    . TUBAL LIGATION     Family History  Problem Relation Age of Onset  . Diabetes Maternal Grandmother   . Cancer - Colon Maternal Grandmother   . Diabetes Paternal Grandmother   . Heart disease Paternal Grandmother   . Alcohol abuse Brother   . Drug abuse Brother    Social History   Tobacco Use  . Smoking status: Former Smoker    Types: Cigarettes    Quit date: 07/18/2018    Years since quitting: 1.1  . Smokeless tobacco: Never Used  Substance Use Topics  . Alcohol use: Not Currently    Comment: none recently  . Drug use: Never    Current Outpatient Medications:  .  divalproex (DEPAKOTE ER) 500 MG 24 hr tablet, Take 3 tablets (1,500 mg total) by mouth daily., Disp: 270 tablet, Rfl: 0 .  traZODone (DESYREL) 100 MG tablet, Take 2 tablets (200 mg total) by mouth at bedtime. SLEEP, Disp: 180 tablet, Rfl: 0 Allergies: Patient has no known allergies.  Review of Systems  Constitutional: Negative for  chills, fever and malaise/fatigue.  HENT: Negative for congestion, sinus pain and sore throat.   Eyes: Negative for blurred vision and pain.  Respiratory: Negative for cough and wheezing.   Cardiovascular: Negative for chest pain and leg swelling.  Gastrointestinal: Negative for abdominal pain, constipation, diarrhea, heartburn, nausea and vomiting.  Genitourinary: Negative for dysuria, frequency, hematuria and urgency.  Musculoskeletal: Negative for back pain, joint pain, myalgias and neck pain.  Skin: Negative for itching and rash.  Neurological: Negative for dizziness, tremors and weakness.  Endo/Heme/Allergies: Does not bruise/bleed easily.  Psychiatric/Behavioral: Negative for depression. The patient is not nervous/anxious and does not have insomnia.     Objective: BP 120/80   Ht 5\' 7"  (1.702 m)   Wt 290 lb (131.5 kg)   LMP 08/30/2019   BMI 45.42 kg/m   Filed Weights   09/12/19 1310  Weight: 290 lb (131.5 kg)   Physical Exam Constitutional:      General: She is not in acute distress.    Appearance: She is well-developed.  HENT:     Head: Normocephalic and atraumatic. No laceration.     Right Ear: Hearing normal.     Left Ear: Hearing normal.     Mouth/Throat:     Pharynx: Uvula midline.  Eyes:     Pupils: Pupils are equal, round, and reactive to light.  Neck:  Musculoskeletal: Normal range of motion and neck supple.     Thyroid: No thyromegaly.  Cardiovascular:     Rate and Rhythm: Normal rate and regular rhythm.     Heart sounds: No murmur. No friction rub. No gallop.   Pulmonary:     Effort: Pulmonary effort is normal. No respiratory distress.     Breath sounds: Normal breath sounds. No wheezing.  Chest:     Breasts:        Right: No mass, skin change or tenderness.        Left: No mass, skin change or tenderness.  Abdominal:     General: Bowel sounds are normal. There is no distension.     Palpations: Abdomen is soft.     Tenderness: There is no  abdominal tenderness. There is no rebound.  Musculoskeletal: Normal range of motion.  Neurological:     Mental Status: She is alert and oriented to person, place, and time.     Cranial Nerves: No cranial nerve deficit.  Skin:    General: Skin is warm and dry.  Psychiatric:        Judgment: Judgment normal.  Vitals signs reviewed.     Assessment: 1. Menorrhagia with regular cycle   All options discussed, desires TLH BS w Cystoscopy.  I have had a careful discussion with this patient about all the options available and the risk/benefits of each. I have fully informed this patient that surgery may subject her to a variety of discomforts and risks: She understands that most patients have surgery with little difficulty, but problems can happen ranging from minor to fatal. These include nausea, vomiting, pain, bleeding, infection, poor healing, hernia, or formation of adhesions. Unexpected reactions may occur from any drug or anesthetic given. Unintended injury may occur to other pelvic or abdominal structures such as Fallopian tubes, ovaries, bladder, ureter (tube from kidney to bladder), or bowel. Nerves going from the pelvis to the legs may be injured. Any such injury may require immediate or later additional surgery to correct the problem. Excessive blood loss requiring transfusion is very unlikely but possible. Dangerous blood clots may form in the legs or lungs. Physical and sexual activity will be restricted in varying degrees for an indeterminate period of time but most often 2-6 weeks.  Finally, she understands that it is impossible to list every possible undesirable effect and that the condition for which surgery is done is not always cured or significantly improved, and in rare cases may be even worse.Ample time was given to answer all questions.  Barnett Applebaum, MD, [facility], [facility] 09/12/2019  1:35 PM

## 2019-09-12 NOTE — Patient Instructions (Signed)
PRE ADMISSION TESTING For Covid, prior to procedure Tuesday 9:00-10:00 [facility] entrance (drive up)  Results in 48-72 hours You will not receive notification if test results are negative. If positive for Covid19, your provider will notify you by phone, with additional instructions.   Total Laparoscopic Hysterectomy, Care After This sheet gives you information about how to care for yourself after your procedure. Your health care provider may also give you more specific instructions. If you have problems or questions, contact your health care provider. What can I expect after the procedure? After the procedure, it is common to have:  Pain and bruising around your incisions.  A sore throat, if a breathing tube was used during surgery.  Fatigue.  Poor appetite.  Less interest in sex. If your ovaries were also removed, it is also common to have symptoms of menopause such as hot flashes, night sweats, and lack of sleep (insomnia). Follow these instructions at home: Bathing  Do not take baths, swim, or use a hot tub until your health care provider approves. You may need to only take showers for 2-3 weeks.  Keep your bandage (dressing) dry until your health care provider says it can be removed. Incision care   Follow instructions from your health care provider about how to take care of your incisions. Make sure you: ? Wash your hands with soap and water before you change your dressing. If soap and water are not available, use hand sanitizer. ? Change your dressing as told by your health care provider. ? Leave stitches (sutures), skin glue, or adhesive strips in place. These skin closures may need to stay in place for 2 weeks or longer. If adhesive strip edges start to loosen and curl up, you may trim the loose edges. Do not remove adhesive strips completely unless your health care provider tells you to do that.  Check your incision area every day for signs of infection.  Check for: ? Redness, swelling, or pain. ? Fluid or blood. ? Warmth. ? Pus or a bad smell. Activity  Get plenty of rest and sleep.  Do not lift anything that is heavier than 10 lbs (4.5 kg) for one month after surgery, or as long as told by your health care provider.  Do not drive or use heavy machinery while taking prescription pain medicine.  Do not drive for 24 hours if you were given a medicine to help you relax (sedative).  Return to your normal activities as told by your health care provider. Ask your health care provider what activities are safe for you. Lifestyle   Do not use any products that contain nicotine or tobacco, such as cigarettes and e-cigarettes. These can delay healing. If you need help quitting, ask your health care provider.  Do not drink alcohol until your health care provider approves. General instructions  Do not douche, use tampons, or have sex for at least 6 weeks, or as told by your health care provider.  Take over-the-counter and prescription medicines only as told by your health care provider.  To monitor yourself for a fever, take your temperature at least once a day during recovery.  If you struggle with physical or emotional changes after your procedure, speak with your health care provider or a therapist.  To prevent or treat constipation while you are taking prescription pain medicine, your health care provider may recommend that you: ? Drink enough fluid to keep your urine clear or pale yellow. ? Take over-the-counter or prescription medicines. ?   Eat foods that are high in fiber, such as fresh fruits and vegetables, whole grains, and beans. ? Limit foods that are high in fat and processed sugars, such as fried and sweet foods.  Keep all follow-up visits as told by your health care provider. This is important. Contact a health care provider if:  You have chills or a fever.  You have redness, swelling, or pain around an incision.  You  have fluid or blood coming from an incision.  Your incision feels warm to the touch.  You have pus or a bad smell coming from an incision.  An incision breaks open.  You feel dizzy or light-headed.  You have pain or bleeding when you urinate.  You have diarrhea, nausea, or vomiting that does not go away.  You have abnormal vaginal discharge.  You have a rash.  You have pain that does not get better with medicine. Get help right away if:  You have a fever and your symptoms suddenly get worse.  You have severe abdominal pain.  You have chest pain.  You have shortness of breath.  You faint.  You have pain, swelling, or redness on your leg.  You have heavy vaginal bleeding with blood clots. Summary  After the procedure it is common to have abdominal pain. Your provider will give you medication for this.  Do not take baths, swim, or use a hot tub until your health care provider approves.  Do not lift anything that is heavier than 10 lbs (4.5 kg) for one month after surgery, or as long as told by your health care provider.  Notify your provider if you have any signs or symptoms of infection after the procedure. This information is not intended to replace advice given to you by your health care provider. Make sure you discuss any questions you have with your health care provider. Document Released: 08/28/2013 Document Revised: 10/20/2017 Document Reviewed: 01/18/2017 Elsevier Patient Education  2020 [employer].

## 2019-09-12 NOTE — H&P (View-Only) (Signed)
PRE-OPERATIVE HISTORY AND PHYSICAL EXAM  HPI:  Gloria Garza is a 38 y.o. [id number] Patient's last menstrual period was 08/30/2019.; she is being admitted for surgery related to abnormal uterine bleeding.  She complains of menorrhagia that  began after the CS and BTL of her last child 4 years ago and its severity is described as severe.  She has regular periods every 28 days that last for 7 days with 5 days extremely heavy and they are associated with moderate menstrual cramping.  She has used the following for attempts at control: maxi pad.  Disruptive to function and work, soiling of clothes.  Previous evaluation: [place], normal. Prior Diagnosis: menorrhagia. Previous Treatment: NSAID IBF, with no improvement And also Depo and OCPs in past with minimal improvement. Does not desire IUD  PMHx: Past Medical History:  Diagnosis Date  . Anxiety   . Bipolar disorder ([place])   . Depression   . Gestational diabetes    Past Surgical History:  Procedure Laterality Date  . CESAREAN SECTION    . TUBAL LIGATION     Family History  Problem Relation Age of Onset  . Diabetes Maternal Grandmother   . Cancer - Colon Maternal Grandmother   . Diabetes Paternal Grandmother   . Heart disease Paternal Grandmother   . Alcohol abuse Brother   . Drug abuse Brother    Social History   Tobacco Use  . Smoking status: Former Smoker    Types: Cigarettes    Quit date: 07/18/2018    Years since quitting: 1.1  . Smokeless tobacco: Never Used  Substance Use Topics  . Alcohol use: Not Currently    Comment: none recently  . Drug use: Never    Current Outpatient Medications:  .  divalproex (DEPAKOTE ER) 500 MG 24 hr tablet, Take 3 tablets (1,500 mg total) by mouth daily., Disp: 270 tablet, Rfl: 0 .  traZODone (DESYREL) 100 MG tablet, Take 2 tablets (200 mg total) by mouth at bedtime. SLEEP, Disp: 180 tablet, Rfl: 0 Allergies: Patient has no known allergies.  Review of Systems  Constitutional: Negative for  chills, fever and malaise/fatigue.  HENT: Negative for congestion, sinus pain and sore throat.   Eyes: Negative for blurred vision and pain.  Respiratory: Negative for cough and wheezing.   Cardiovascular: Negative for chest pain and leg swelling.  Gastrointestinal: Negative for abdominal pain, constipation, diarrhea, heartburn, nausea and vomiting.  Genitourinary: Negative for dysuria, frequency, hematuria and urgency.  Musculoskeletal: Negative for back pain, joint pain, myalgias and neck pain.  Skin: Negative for itching and rash.  Neurological: Negative for dizziness, tremors and weakness.  Endo/Heme/Allergies: Does not bruise/bleed easily.  Psychiatric/Behavioral: Negative for depression. The patient is not nervous/anxious and does not have insomnia.     Objective: BP 120/80   Ht 5\' 7"  (1.702 m)   Wt 290 lb (131.5 kg)   LMP 08/30/2019   BMI 45.42 kg/m   Filed Weights   09/12/19 1310  Weight: 290 lb (131.5 kg)   Physical Exam Constitutional:      General: She is not in acute distress.    Appearance: She is well-developed.  HENT:     Head: Normocephalic and atraumatic. No laceration.     Right Ear: Hearing normal.     Left Ear: Hearing normal.     Mouth/Throat:     Pharynx: Uvula midline.  Eyes:     Pupils: Pupils are equal, round, and reactive to light.  Neck:  Musculoskeletal: Normal range of motion and neck supple.     Thyroid: No thyromegaly.  Cardiovascular:     Rate and Rhythm: Normal rate and regular rhythm.     Heart sounds: No murmur. No friction rub. No gallop.   Pulmonary:     Effort: Pulmonary effort is normal. No respiratory distress.     Breath sounds: Normal breath sounds. No wheezing.  Chest:     Breasts:        Right: No mass, skin change or tenderness.        Left: No mass, skin change or tenderness.  Abdominal:     General: Bowel sounds are normal. There is no distension.     Palpations: Abdomen is soft.     Tenderness: There is no  abdominal tenderness. There is no rebound.  Musculoskeletal: Normal range of motion.  Neurological:     Mental Status: She is alert and oriented to person, place, and time.     Cranial Nerves: No cranial nerve deficit.  Skin:    General: Skin is warm and dry.  Psychiatric:        Judgment: Judgment normal.  Vitals signs reviewed.     Assessment: 1. Menorrhagia with regular cycle   All options discussed, desires TLH BS w Cystoscopy.  I have had a careful discussion with this patient about all the options available and the risk/benefits of each. I have fully informed this patient that surgery may subject her to a variety of discomforts and risks: She understands that most patients have surgery with little difficulty, but problems can happen ranging from minor to fatal. These include nausea, vomiting, pain, bleeding, infection, poor healing, hernia, or formation of adhesions. Unexpected reactions may occur from any drug or anesthetic given. Unintended injury may occur to other pelvic or abdominal structures such as Fallopian tubes, ovaries, bladder, ureter (tube from kidney to bladder), or bowel. Nerves going from the pelvis to the legs may be injured. Any such injury may require immediate or later additional surgery to correct the problem. Excessive blood loss requiring transfusion is very unlikely but possible. Dangerous blood clots may form in the legs or lungs. Physical and sexual activity will be restricted in varying degrees for an indeterminate period of time but most often 2-6 weeks.  Finally, she understands that it is impossible to list every possible undesirable effect and that the condition for which surgery is done is not always cured or significantly improved, and in rare cases may be even worse.Ample time was given to answer all questions.  Barnett Applebaum, MD, [facility], [facility] 09/12/2019  1:35 PM

## 2019-09-13 ENCOUNTER — Encounter: Attending: General Surgery | Admitting: Dietician

## 2019-09-13 ENCOUNTER — Encounter: Payer: Self-pay | Admitting: Dietician

## 2019-09-13 DIAGNOSIS — Z6841 Body Mass Index (BMI) 40.0 and over, adult: Secondary | ICD-10-CM | POA: Diagnosis not present

## 2019-09-13 DIAGNOSIS — Z833 Family history of diabetes mellitus: Secondary | ICD-10-CM | POA: Diagnosis not present

## 2019-09-13 DIAGNOSIS — R7303 Prediabetes: Secondary | ICD-10-CM | POA: Diagnosis not present

## 2019-09-13 DIAGNOSIS — Z8249 Family history of ischemic heart disease and other diseases of the circulatory system: Secondary | ICD-10-CM | POA: Insufficient documentation

## 2019-09-13 DIAGNOSIS — Z713 Dietary counseling and surveillance: Secondary | ICD-10-CM | POA: Insufficient documentation

## 2019-09-13 DIAGNOSIS — F319 Bipolar disorder, unspecified: Secondary | ICD-10-CM | POA: Insufficient documentation

## 2019-09-13 NOTE — Patient Instructions (Signed)
   Continue with your healthy choices and low-carb meals, great job!  Keep up the regular exercise too!  Start trying some options for protein shakes you can have for the pre-op diet and after surgery.

## 2019-09-13 NOTE — Progress Notes (Signed)
Appt start time: 1100 end time:  1130.  Assessment:   #3 SWL Appointment.   Start Wt at NDES: 281.1lbs Wt: 289.2lbslbs Ht: 5'7" BMI: 45.3   Learning Readiness:   Change in progress  MEDICATIONS: divalproex, traZODone  Progress:  Patient reports starting new job with higher pay, although continues to work long hours at times.   She reports her medication for bipolar is likely causing weight gain, per her psychiatrist.   She continues to make positive diet changes, this month has begun eating higher fiber starches, but does limit portions.  Dietary intake:  Breakfast: boiled eggs with avocado or bacon; oatmeal squares cereal  Snack: pear or peaches  Lunch: tuna and crackers; premade caesar salad with chicken Snack: raw veg and cheese Dinner: meat + vegetables sometimes with vegetable based pasta Snack: small portion 90% cacao chocolate chips + peanuts; or kind bar Beverages: water; rarely diet coke or juice  Usual physical activity: walking 60 minutes 2-3 times per week,+ activity with children + more activity on the job   Diet to Follow: 15-30 g carbohydrates (preferably whole grain/ high fiber) with meals                 Nutritional Diagnosis:  [place]-3.3 Overweight/obesity related to history of excess calories and physical inactivity as evidenced by patient current BMI of 45.3, following dietary guidelines for continued weight loss prior to bariatric surgery.              Intervention:   . Nutrition counseling for weight loss prior to upcoming bariatric surgery. . Discussed appropriate food portions, controlling amounts even with "healthy" foods ie nuts . Patient's goal is to maintain healthy habits while adjusting to new job.   Teaching Method Utilized:  Visual Auditory  Handouts given during visit include:  Goals and instructions   Barriers to learning/adherence to lifestyle change: none  Demonstrated degree of understanding via:  Teach Back    Monitoring/Evaluation:  Dietary intake, exercise, and body weight 10/14/19 at 4:30pm.

## 2019-09-16 ENCOUNTER — Encounter: Admitting: Obstetrics & Gynecology

## 2019-09-19 ENCOUNTER — Other Ambulatory Visit: Payer: Self-pay

## 2019-09-19 ENCOUNTER — Encounter
Admission: RE | Admit: 2019-09-19 | Discharge: 2019-09-19 | Disposition: A | Discharge status: Home / Self Care | Source: Ambulatory Visit | Attending: Obstetrics & Gynecology | Admitting: Obstetrics & Gynecology

## 2019-09-19 NOTE — Patient Instructions (Addendum)
Your COVID swab is scheduled on: Monday 09/23/2019.  Drive up in front of [place] any time 8:00-10:30 am and remain in your vehicle.  Your procedure is scheduled on: Thursday 09/26/2019 Report to Same Day Surgery 2nd floor [facility] Entrance-take elevator on left to 2nd floor.  Check in with surgery information desk.) To find out your arrival time, call [phone] 1:00-3:00 PM on Wednesday 09/25/2019  Remember: Instructions that are not followed completely may result in serious medical risk, up to and including death, or upon the discretion of your surgeon and anesthesiologist your surgery may need to be rescheduled.    __x__ 1. Do not eat food (including mints, candies, chewing gum) after midnight the night before your procedure. You may drink clear liquids up to 2 hours before you are scheduled to arrive at the hospital for your procedure.  Do not drink anything within 2 hours of your scheduled arrival to the hospital.  Approved clear liquids:  --Water or Apple juice without pulp  --Clear carbohydrate beverage such as Gatorade or Powerade  --Black Coffee or Clear Tea (No milk, no creamers, do not add anything to the coffee or tea)    __x__ 2. No Alcohol for 24 hours before or after surgery.   __x__ 3. No Smoking or e-cigarettes for 24 hours before surgery.  Do not use any chewable tobacco products for at least 6 hours before surgery.   __x__ 4. Notify your doctor if there is any change in your medical condition (cold, fever, infections).   __x__ 5. On the morning of surgery brush your teeth with toothpaste and water.  You may rinse your mouth with mouthwash if you wish.  Do not swallow any toothpaste or mouthwash.  Please read over the following fact sheets that you were given:   [facility] Preparing for Surgery and/or MRSA Information    __x__ Use CHG Soap as directed on instruction sheet.   Do not wear jewelry, make-up, hairpins, clips or nail polish  on the day of surgery.  Do not wear lotions, powders, deodorant, or perfumes.   Do not shave below the face/neck 48 hours prior to surgery.   Do not bring valuables to the hospital.    [facility] is not responsible for any belongings or valuables.               Eyeglasses may not be worn into surgery.  For patients admitted to the hospital, discharge time is determined by your treatment team.  For patients discharged on the day of surgery, you will NOT be permitted to drive yourself home.  You must have a responsible adult with you for 24 hours after surgery.  __x__ Take these medicines on the morning of surgery with a SMALL SIP OF WATER:  1. Divalproex (Depakote)  __x__ Follow recommendations from Cardiologist, Pulmonologist or PCP regarding stopping Aspirin, Coumadin, Plavix, Eliquis, Effient, Pradaxa, and Pletal.  __x__ STARTING TODAY: Do not take any Anti-inflammatories such as Advil, Ibuprofen, Motrin, Aleve, Naproxen, Naprosyn, BC/Goodies powders or aspirin products. You may take Tylenol if needed.   __x__ STARTING TODAY: Do not take any over the counter supplements until after surgery. You may continue to take your multivitamin.  Reviewed instructions with patient and answered questions via telephone interview 09/19/2019 @ 4:05 pm.

## 2019-09-21 NOTE — Progress Notes (Signed)
Virtual Visit via Telephone Note  I connected with Gloria Garza on 07/03/19 at  2:00 PM EDT by telephone and verified that I am speaking with the correct person using two identifiers.  Location: Patient: home Provider:office   I discussed the limitations, risks, security and privacy concerns of performing an evaluation and management service by telephone and the availability of in person appointments. I also discussed with the patient that there may be a patient responsible charge related to this service. The patient expressed understanding and agreed to proceed.     Participation Level: Active  Type of Therapy: Individual Therapy  Treatment Goals addressed: Anxiety and Coping  Interventions: CBT and Motivational Interviewing  Summary: Gloria Garza is a 38 y.o. female who presents with continued symptoms of diagnosis. LCSW offered education about common irrational fears and beliefs that contribute to anxiety. Reviewed and showed client how to use CBT/REBT strategies to recognize and re-frame irrational fears and self-talk as a means of increasing client's capacity to handle their anxiety more constructively.   Suicidal/Homicidal: No  Plan: Return again in 2 weeks.  Diagnosis: Axis I: Bipolar, Depressed    Axis II: No diagnosis      I discussed the assessment and treatment plan with the patient. The patient was provided an opportunity to ask questions and all were answered. The patient agreed with the plan and demonstrated an understanding of the instructions.   The patient was advised to call back or seek an in-person evaluation if the symptoms worsen or if the condition fails to improve as anticipated.  I provided 30 minutes of non-face-to-face time during this encounter.   Lubertha South, LCSW

## 2019-09-23 ENCOUNTER — Other Ambulatory Visit: Payer: Self-pay

## 2019-09-23 ENCOUNTER — Other Ambulatory Visit
Admission: RE | Admit: 2019-09-23 | Discharge: 2019-09-23 | Disposition: A | Discharge status: Home / Self Care | Source: Ambulatory Visit | Attending: Obstetrics & Gynecology | Admitting: Obstetrics & Gynecology

## 2019-09-23 DIAGNOSIS — Z01812 Encounter for preprocedural laboratory examination: Secondary | ICD-10-CM | POA: Insufficient documentation

## 2019-09-23 DIAGNOSIS — Z20828 Contact with and (suspected) exposure to other viral communicable diseases: Secondary | ICD-10-CM | POA: Insufficient documentation

## 2019-09-23 LAB — CBC
HCT: 42 % (ref 36.0–46.0)
Hemoglobin: 13.3 g/dL (ref 12.0–15.0)
MCH: 30 pg (ref 26.0–34.0)
MCHC: 31.7 g/dL (ref 30.0–36.0)
MCV: 94.6 fL (ref 80.0–100.0)
Platelets: 190 10*3/uL (ref 150–400)
RBC: 4.44 MIL/uL (ref 3.87–5.11)
RDW: 15.2 % (ref 11.5–15.5)
WBC: 6.8 10*3/uL (ref 4.0–10.5)
nRBC: 0 % (ref 0.0–0.2)

## 2019-09-23 LAB — TYPE AND SCREEN
ABO/RH(D): O POS
Antibody Screen: NEGATIVE

## 2019-09-23 LAB — SARS CORONAVIRUS 2 (TAT 6-24 HRS): SARS Coronavirus 2: NEGATIVE

## 2019-09-25 MED ORDER — SODIUM CHLORIDE 0.9 % IV SOLN
2.0000 g | INTRAVENOUS | Status: AC
  Administered 2019-09-26: 08:00:00 2 g via INTRAVENOUS

## 2019-09-26 ENCOUNTER — Ambulatory Visit: Admitting: Registered Nurse

## 2019-09-26 ENCOUNTER — Encounter: Payer: Self-pay | Admitting: *Deleted

## 2019-09-26 ENCOUNTER — Encounter
Admission: RE | Disposition: A | Discharge status: Home / Self Care | Payer: Self-pay | Source: Home / Self Care | Attending: Obstetrics & Gynecology

## 2019-09-26 ENCOUNTER — Observation Stay
Admission: RE | Admit: 2019-09-26 | Discharge: 2019-09-27 | Disposition: A | Discharge status: Home / Self Care | Attending: Obstetrics & Gynecology | Admitting: Obstetrics & Gynecology

## 2019-09-26 ENCOUNTER — Other Ambulatory Visit: Payer: Self-pay

## 2019-09-26 DIAGNOSIS — Z87891 Personal history of nicotine dependence: Secondary | ICD-10-CM | POA: Insufficient documentation

## 2019-09-26 DIAGNOSIS — F319 Bipolar disorder, unspecified: Secondary | ICD-10-CM | POA: Diagnosis not present

## 2019-09-26 DIAGNOSIS — Z79899 Other long term (current) drug therapy: Secondary | ICD-10-CM | POA: Insufficient documentation

## 2019-09-26 DIAGNOSIS — N920 Excessive and frequent menstruation with regular cycle: Secondary | ICD-10-CM | POA: Diagnosis not present

## 2019-09-26 DIAGNOSIS — Z6841 Body Mass Index (BMI) 40.0 and over, adult: Secondary | ICD-10-CM | POA: Diagnosis not present

## 2019-09-26 DIAGNOSIS — Z833 Family history of diabetes mellitus: Secondary | ICD-10-CM | POA: Diagnosis not present

## 2019-09-26 DIAGNOSIS — F419 Anxiety disorder, unspecified: Secondary | ICD-10-CM | POA: Insufficient documentation

## 2019-09-26 DIAGNOSIS — Z8249 Family history of ischemic heart disease and other diseases of the circulatory system: Secondary | ICD-10-CM | POA: Insufficient documentation

## 2019-09-26 DIAGNOSIS — Z800 Family history of malignant neoplasm of digestive organs: Secondary | ICD-10-CM | POA: Diagnosis not present

## 2019-09-26 DIAGNOSIS — N870 Mild cervical dysplasia: Secondary | ICD-10-CM | POA: Diagnosis not present

## 2019-09-26 DIAGNOSIS — Z811 Family history of alcohol abuse and dependence: Secondary | ICD-10-CM | POA: Insufficient documentation

## 2019-09-26 DIAGNOSIS — D251 Intramural leiomyoma of uterus: Secondary | ICD-10-CM | POA: Insufficient documentation

## 2019-09-26 DIAGNOSIS — Z8632 Personal history of gestational diabetes: Secondary | ICD-10-CM | POA: Insufficient documentation

## 2019-09-26 DIAGNOSIS — Z8489 Family history of other specified conditions: Secondary | ICD-10-CM | POA: Diagnosis not present

## 2019-09-26 HISTORY — PX: LAPAROSCOPIC HYSTERECTOMY: SHX1926

## 2019-09-26 HISTORY — PX: CYSTOSCOPY: SHX5120

## 2019-09-26 LAB — POCT PREGNANCY, URINE: Preg Test, Ur: NEGATIVE

## 2019-09-26 LAB — ABO/RH: ABO/RH(D): O POS

## 2019-09-26 SURGERY — HYSTERECTOMY, TOTAL, LAPAROSCOPIC
Anesthesia: General

## 2019-09-26 MED ORDER — OXYCODONE-ACETAMINOPHEN 5-325 MG PO TABS
1.0000 | ORAL_TABLET | Freq: Once | ORAL | Status: AC
  Administered 2019-09-26: 1 via ORAL

## 2019-09-26 MED ORDER — MORPHINE SULFATE (PF) 4 MG/ML IV SOLN: 1.0000 mg | INTRAVENOUS | Status: DC | PRN

## 2019-09-26 MED ORDER — ROCURONIUM BROMIDE 50 MG/5ML IV SOLN
INTRAVENOUS | Status: AC
  Filled 2019-09-26: qty 2

## 2019-09-26 MED ORDER — FENTANYL CITRATE (PF) 100 MCG/2ML IJ SOLN
INTRAMUSCULAR | Status: AC
  Administered 2019-09-26: 25 ug via INTRAVENOUS
  Filled 2019-09-26: qty 2

## 2019-09-26 MED ORDER — ACETAMINOPHEN 325 MG PO TABS: 650.0000 mg | ORAL_TABLET | ORAL | Status: DC | PRN

## 2019-09-26 MED ORDER — BISACODYL 10 MG RE SUPP: 10.0000 mg | Freq: Every day | RECTAL | Status: DC | PRN

## 2019-09-26 MED ORDER — ACETAMINOPHEN 650 MG RE SUPP: 650.0000 mg | RECTAL | Status: DC | PRN

## 2019-09-26 MED ORDER — HYDROMORPHONE HCL 1 MG/ML IJ SOLN
0.2500 mg | INTRAMUSCULAR | Status: AC | PRN
  Administered 2019-09-26 (×8): 0.25 mg via INTRAVENOUS

## 2019-09-26 MED ORDER — ACETAMINOPHEN NICU IV SYRINGE 10 MG/ML
INTRAVENOUS | Status: AC
  Filled 2019-09-26: qty 1

## 2019-09-26 MED ORDER — PROPOFOL 10 MG/ML IV BOLUS
INTRAVENOUS | Status: AC
  Filled 2019-09-26: qty 20

## 2019-09-26 MED ORDER — FENTANYL CITRATE (PF) 100 MCG/2ML IJ SOLN
INTRAMUSCULAR | Status: AC
  Filled 2019-09-26: qty 2

## 2019-09-26 MED ORDER — PROMETHAZINE HCL 25 MG/ML IJ SOLN
6.2500 mg | Freq: Once | INTRAMUSCULAR | Status: AC
  Administered 2019-09-26: 11:00:00 6.25 mg via INTRAVENOUS

## 2019-09-26 MED ORDER — ONDANSETRON HCL 4 MG/2ML IJ SOLN
INTRAMUSCULAR | Status: AC
  Filled 2019-09-26: qty 2

## 2019-09-26 MED ORDER — MIDAZOLAM HCL 2 MG/2ML IJ SOLN
INTRAMUSCULAR | Status: DC | PRN
  Administered 2019-09-26: 2 mg via INTRAVENOUS

## 2019-09-26 MED ORDER — SODIUM CHLORIDE 0.9 % IV SOLN
INTRAVENOUS | Status: AC
  Filled 2019-09-26: qty 2

## 2019-09-26 MED ORDER — SODIUM CHLORIDE FLUSH 0.9 % IV SOLN
INTRAVENOUS | Status: AC
  Filled 2019-09-26: qty 10

## 2019-09-26 MED ORDER — PROPOFOL 10 MG/ML IV BOLUS
INTRAVENOUS | Status: DC | PRN
  Administered 2019-09-26: 160 mg via INTRAVENOUS

## 2019-09-26 MED ORDER — ACETAMINOPHEN 10 MG/ML IV SOLN
INTRAVENOUS | Status: DC | PRN
  Administered 2019-09-26: 1000 mg via INTRAVENOUS

## 2019-09-26 MED ORDER — LACTATED RINGERS IV SOLN: Freq: Once | INTRAVENOUS | Status: DC

## 2019-09-26 MED ORDER — DEXAMETHASONE SODIUM PHOSPHATE 10 MG/ML IJ SOLN
INTRAMUSCULAR | Status: DC | PRN
  Administered 2019-09-26: 10 mg via INTRAVENOUS

## 2019-09-26 MED ORDER — ONDANSETRON HCL 4 MG PO TABS
4.0000 mg | ORAL_TABLET | Freq: Four times a day (QID) | ORAL | Status: DC | PRN
  Administered 2019-09-27: 4 mg via ORAL
  Filled 2019-09-26: qty 1

## 2019-09-26 MED ORDER — LACTATED RINGERS IV SOLN
INTRAVENOUS | Status: DC
  Administered 2019-09-26: 07:00:00 via INTRAVENOUS

## 2019-09-26 MED ORDER — LACTATED RINGERS IV SOLN: INTRAVENOUS | Status: DC

## 2019-09-26 MED ORDER — IBUPROFEN 800 MG PO TABS
400.0000 mg | ORAL_TABLET | Freq: Four times a day (QID) | ORAL | Status: DC | PRN
  Administered 2019-09-26 – 2019-09-27 (×3): 400 mg via ORAL
  Filled 2019-09-26 (×3): qty 1

## 2019-09-26 MED ORDER — HYDROMORPHONE HCL 1 MG/ML IJ SOLN: 0.2000 mg | INTRAMUSCULAR | Status: DC | PRN

## 2019-09-26 MED ORDER — OXYCODONE-ACETAMINOPHEN 5-325 MG PO TABS: 1.0000 | ORAL_TABLET | ORAL | 0 refills | Status: DC | PRN

## 2019-09-26 MED ORDER — KETOROLAC TROMETHAMINE 30 MG/ML IJ SOLN: 30.0000 mg | Freq: Four times a day (QID) | INTRAMUSCULAR | Status: DC

## 2019-09-26 MED ORDER — OXYCODONE-ACETAMINOPHEN 5-325 MG PO TABS
1.0000 | ORAL_TABLET | ORAL | Status: DC | PRN
  Administered 2019-09-26 – 2019-09-27 (×5): 1 via ORAL
  Filled 2019-09-26 (×5): qty 1

## 2019-09-26 MED ORDER — ONDANSETRON HCL 4 MG/2ML IJ SOLN: 4.0000 mg | Freq: Four times a day (QID) | INTRAMUSCULAR | Status: DC | PRN

## 2019-09-26 MED ORDER — FENTANYL CITRATE (PF) 100 MCG/2ML IJ SOLN
INTRAMUSCULAR | Status: DC | PRN
  Administered 2019-09-26 (×2): 50 ug via INTRAVENOUS
  Administered 2019-09-26 (×2): 100 ug via INTRAVENOUS

## 2019-09-26 MED ORDER — MIDAZOLAM HCL 2 MG/2ML IJ SOLN
INTRAMUSCULAR | Status: AC
  Filled 2019-09-26: qty 2

## 2019-09-26 MED ORDER — EPHEDRINE SULFATE 50 MG/ML IJ SOLN
INTRAMUSCULAR | Status: DC | PRN
  Administered 2019-09-26: 10 mg via INTRAVENOUS

## 2019-09-26 MED ORDER — ONDANSETRON HCL 4 MG/2ML IJ SOLN
INTRAMUSCULAR | Status: DC | PRN
  Administered 2019-09-26: 4 mg via INTRAVENOUS

## 2019-09-26 MED ORDER — SUGAMMADEX SODIUM 500 MG/5ML IV SOLN
INTRAVENOUS | Status: DC | PRN
  Administered 2019-09-26: 300 mg via INTRAVENOUS

## 2019-09-26 MED ORDER — BUPIVACAINE HCL (PF) 0.5 % IJ SOLN
INTRAMUSCULAR | Status: AC
  Filled 2019-09-26: qty 30

## 2019-09-26 MED ORDER — SENNOSIDES-DOCUSATE SODIUM 8.6-50 MG PO TABS: 1.0000 | ORAL_TABLET | Freq: Every evening | ORAL | Status: DC | PRN

## 2019-09-26 MED ORDER — HYDROMORPHONE HCL 1 MG/ML IJ SOLN
INTRAMUSCULAR | Status: AC
  Administered 2019-09-26: 0.25 mg via INTRAVENOUS
  Filled 2019-09-26: qty 1

## 2019-09-26 MED ORDER — TRAZODONE HCL 100 MG PO TABS
200.0000 mg | ORAL_TABLET | Freq: Every day | ORAL | Status: DC
  Administered 2019-09-26: 200 mg via ORAL
  Filled 2019-09-26: qty 2

## 2019-09-26 MED ORDER — LIDOCAINE HCL (CARDIAC) PF 100 MG/5ML IV SOSY
PREFILLED_SYRINGE | INTRAVENOUS | Status: DC | PRN
  Administered 2019-09-26: 100 mg via INTRAVENOUS

## 2019-09-26 MED ORDER — FAMOTIDINE 20 MG PO TABS
ORAL_TABLET | ORAL | Status: AC
  Administered 2019-09-26: 20 mg via ORAL
  Filled 2019-09-26: qty 1

## 2019-09-26 MED ORDER — SIMETHICONE 80 MG PO CHEW: 80.0000 mg | CHEWABLE_TABLET | Freq: Four times a day (QID) | ORAL | Status: DC | PRN

## 2019-09-26 MED ORDER — BUPIVACAINE HCL (PF) 0.5 % IJ SOLN
INTRAMUSCULAR | Status: DC | PRN
  Administered 2019-09-26: 14 mL

## 2019-09-26 MED ORDER — PROMETHAZINE HCL 25 MG/ML IJ SOLN
INTRAMUSCULAR | Status: AC
  Administered 2019-09-26: 6.25 mg via INTRAVENOUS
  Filled 2019-09-26: qty 1

## 2019-09-26 MED ORDER — FENTANYL CITRATE (PF) 100 MCG/2ML IJ SOLN
25.0000 ug | INTRAMUSCULAR | Status: AC | PRN
  Administered 2019-09-26 (×6): 25 ug via INTRAVENOUS

## 2019-09-26 MED ORDER — MORPHINE SULFATE (PF) 2 MG/ML IV SOLN: 1.0000 mg | INTRAVENOUS | Status: DC | PRN

## 2019-09-26 MED ORDER — FAMOTIDINE 20 MG PO TABS
20.0000 mg | ORAL_TABLET | Freq: Once | ORAL | Status: AC
  Administered 2019-09-26: 06:00:00 20 mg via ORAL

## 2019-09-26 MED ORDER — KETOROLAC TROMETHAMINE 30 MG/ML IJ SOLN
INTRAMUSCULAR | Status: DC | PRN
  Administered 2019-09-26: 30 mg via INTRAVENOUS

## 2019-09-26 MED ORDER — PHENYLEPHRINE HCL (PRESSORS) 10 MG/ML IV SOLN
INTRAVENOUS | Status: DC | PRN
  Administered 2019-09-26: 200 ug via INTRAVENOUS
  Administered 2019-09-26: 100 ug via INTRAVENOUS
  Administered 2019-09-26: 150 ug via INTRAVENOUS
  Administered 2019-09-26 (×5): 100 ug via INTRAVENOUS
  Administered 2019-09-26: 150 ug via INTRAVENOUS
  Administered 2019-09-26 (×3): 100 ug via INTRAVENOUS
  Administered 2019-09-26: 50 ug via INTRAVENOUS

## 2019-09-26 MED ORDER — OXYCODONE-ACETAMINOPHEN 5-325 MG PO TABS
ORAL_TABLET | ORAL | Status: AC
  Filled 2019-09-26: qty 1

## 2019-09-26 MED ORDER — DIVALPROEX SODIUM ER 500 MG PO TB24
1500.0000 mg | ORAL_TABLET | Freq: Every day | ORAL | Status: DC
  Administered 2019-09-26: 1500 mg via ORAL
  Filled 2019-09-26 (×2): qty 3

## 2019-09-26 MED ORDER — ROCURONIUM BROMIDE 100 MG/10ML IV SOLN
INTRAVENOUS | Status: DC | PRN
  Administered 2019-09-26: 15 mg via INTRAVENOUS
  Administered 2019-09-26: 5 mg via INTRAVENOUS
  Administered 2019-09-26 (×2): 50 mg via INTRAVENOUS

## 2019-09-26 MED ORDER — ONDANSETRON HCL 4 MG/2ML IJ SOLN
4.0000 mg | Freq: Once | INTRAMUSCULAR | Status: AC | PRN
  Administered 2019-09-26: 4 mg via INTRAVENOUS

## 2019-09-26 MED ORDER — DOCUSATE SODIUM 100 MG PO CAPS
100.0000 mg | ORAL_CAPSULE | Freq: Two times a day (BID) | ORAL | Status: DC
  Administered 2019-09-26: 100 mg via ORAL
  Filled 2019-09-26: qty 1

## 2019-09-26 SURGICAL SUPPLY — 53 items
BAG URINE DRAIN 2000ML AR STRL (UROLOGICAL SUPPLIES) ×4 IMPLANT
BLADE SURG SZ11 CARB STEEL (BLADE) ×4 IMPLANT
CANISTER SUCT 1200ML W/VALVE (MISCELLANEOUS) ×4 IMPLANT
CATH FOLEY 2WAY  5CC 16FR (CATHETERS) ×2
CATH URTH 16FR FL 2W BLN LF (CATHETERS) ×2 IMPLANT
CHLORAPREP W/TINT 26 (MISCELLANEOUS) ×4 IMPLANT
COVER WAND RF STERILE (DRAPES) ×4 IMPLANT
DEFOGGER SCOPE WARMER CLEARIFY (MISCELLANEOUS) ×4 IMPLANT
DERMABOND ADVANCED (GAUZE/BANDAGES/DRESSINGS) ×2
DERMABOND ADVANCED .7 DNX12 (GAUZE/BANDAGES/DRESSINGS) ×2 IMPLANT
DEVICE SUTURE ENDOST 10MM (ENDOMECHANICALS) ×2 IMPLANT
DRAPE CAMERA CLOSED 9X96 (DRAPES) ×2 IMPLANT
DRSG TEGADERM 2-3/8X2-3/4 SM (GAUZE/BANDAGES/DRESSINGS) IMPLANT
GLOVE BIO SURGEON STRL SZ8 (GLOVE) ×20 IMPLANT
GLOVE INDICATOR 8.0 STRL GRN (GLOVE) ×12 IMPLANT
GOWN STRL REUS W/ TWL LRG LVL3 (GOWN DISPOSABLE) ×2 IMPLANT
GOWN STRL REUS W/ TWL XL LVL3 (GOWN DISPOSABLE) ×4 IMPLANT
GOWN STRL REUS W/TWL LRG LVL3 (GOWN DISPOSABLE) ×2
GOWN STRL REUS W/TWL XL LVL3 (GOWN DISPOSABLE) ×4
GRASPER SUT TROCAR 14GX15 (MISCELLANEOUS) ×4 IMPLANT
IRRIGATION STRYKERFLOW (MISCELLANEOUS) ×2 IMPLANT
IRRIGATOR STRYKERFLOW (MISCELLANEOUS) ×4
IV LACTATED RINGERS 1000ML (IV SOLUTION) ×8 IMPLANT
KIT PINK PAD W/HEAD ARE REST (MISCELLANEOUS) ×4
KIT PINK PAD W/HEAD ARM REST (MISCELLANEOUS) ×2 IMPLANT
KIT TURNOVER CYSTO (KITS) ×4 IMPLANT
LABEL OR SOLS (LABEL) ×4 IMPLANT
MANIPULATOR VCARE LG CRV RETR (MISCELLANEOUS) IMPLANT
MANIPULATOR VCARE SML CRV RETR (MISCELLANEOUS) ×2 IMPLANT
MANIPULATOR VCARE STD CRV RETR (MISCELLANEOUS) IMPLANT
NEEDLE INSUFFLATION 150MM (ENDOMECHANICALS) ×2 IMPLANT
NEEDLE VERESS 14GA 120MM (NEEDLE) ×4 IMPLANT
NS IRRIG 500ML POUR BTL (IV SOLUTION) ×4 IMPLANT
OCCLUDER COLPOPNEUMO (BALLOONS) ×2 IMPLANT
PACK GYN LAPAROSCOPIC (MISCELLANEOUS) ×4 IMPLANT
PAD OB MATERNITY 4.3X12.25 (PERSONAL CARE ITEMS) ×4 IMPLANT
PAD PREP 24X41 OB/GYN DISP (PERSONAL CARE ITEMS) ×4 IMPLANT
PORT ACCESS TROCAR AIRSEAL 12 (TROCAR) ×2 IMPLANT
PORT ACCESS TROCAR AIRSEAL 5M (TROCAR) ×2
SCISSORS METZENBAUM CVD 33 (INSTRUMENTS) ×2 IMPLANT
SET CYSTO W/LG BORE CLAMP LF (SET/KITS/TRAYS/PACK) ×4 IMPLANT
SET TRI-LUMEN FLTR TB AIRSEAL (TUBING) ×4 IMPLANT
SHEARS HARMONIC ACE PLUS 36CM (ENDOMECHANICALS) ×4 IMPLANT
SLEEVE ENDOPATH XCEL 5M (ENDOMECHANICALS) ×4 IMPLANT
SPONGE GAUZE 2X2 8PLY STER LF (GAUZE/BANDAGES/DRESSINGS)
SPONGE GAUZE 2X2 8PLY STRL LF (GAUZE/BANDAGES/DRESSINGS) IMPLANT
SUT ENDO VLOC 180-0-8IN (SUTURE) ×4 IMPLANT
SUT VIC AB 0 CT1 36 (SUTURE) ×4 IMPLANT
SUT VIC AB 4-0 FS2 27 (SUTURE) ×4 IMPLANT
SYR 10ML LL (SYRINGE) ×4 IMPLANT
SYR 50ML LL SCALE MARK (SYRINGE) ×4 IMPLANT
TROCAR 5M 150ML BLDLS (TROCAR) ×4 IMPLANT
TROCAR XCEL NON-BLD 5MMX100MML (ENDOMECHANICALS) ×4 IMPLANT

## 2019-09-26 NOTE — Transfer of Care (Signed)
Immediate Anesthesia Transfer of Care Note  Patient: Gloria Garza  Procedure(s) Performed: HYSTERECTOMY TOTAL LAPAROSCOPIC (N/A ) CYSTOSCOPY  Patient Location: PACU  Anesthesia Type:General  Level of Consciousness: awake and alert   Airway & Oxygen Therapy: Patient Spontanous Breathing and Patient connected to face mask oxygen  Post-op Assessment: Report given to RN and Post -op Vital signs reviewed and stable  Post vital signs: Reviewed and stable  Last Vitals:  Vitals Value Taken Time  BP    Temp 36.1 C 09/26/19 1015  Pulse 100 09/26/19 1017  Resp 22 09/26/19 1015  SpO2 100 % 09/26/19 1017  Vitals shown include unvalidated device data.  Last Pain:  Vitals:   09/26/19 0614  TempSrc: Temporal  PainSc: 0-No pain         Complications: No apparent anesthesia complications

## 2019-09-26 NOTE — Anesthesia Post-op Follow-up Note (Signed)
Anesthesia QCDR form completed.        

## 2019-09-26 NOTE — Anesthesia Preprocedure Evaluation (Signed)
Anesthesia Evaluation  Patient identified by MRN, date of birth, ID band Patient awake    Reviewed: Allergy & Precautions, NPO status , Patient's Chart, lab work & pertinent test results  History of Anesthesia Complications Negative for: history of anesthetic complications  Airway Mallampati: II       Dental   Pulmonary neg sleep apnea, neg COPD, Not current smoker, former smoker,           Cardiovascular (-) hypertension(-) Past MI and (-) CHF (-) dysrhythmias (-) Valvular Problems/Murmurs     Neuro/Psych neg Seizures Anxiety Depression Bipolar Disorder    GI/Hepatic Neg liver ROS, neg GERD  ,  Endo/Other  neg diabetes (Pt denies)Morbid obesity  Renal/GU negative Renal ROS     Musculoskeletal   Abdominal   Peds  Hematology   Anesthesia Other Findings   Reproductive/Obstetrics                             Anesthesia Physical Anesthesia Plan  ASA: II  Anesthesia Plan:    Post-op Pain Management:    Induction:   PONV Risk Score and Plan:   Airway Management Planned:   Additional Equipment:   Intra-op Plan:   Post-operative Plan:   Informed Consent: I have reviewed the patients History and Physical, chart, labs and discussed the procedure including the risks, benefits and alternatives for the proposed anesthesia with the patient or authorized representative who has indicated his/her understanding and acceptance.       Plan Discussed with:   Anesthesia Plan Comments:         Anesthesia Quick Evaluation

## 2019-09-26 NOTE — Anesthesia Procedure Notes (Signed)
Procedure Name: Intubation Date/Time: 09/26/2019 7:30 AM Performed by: Johnna Acosta, CRNA Pre-anesthesia Checklist: Patient identified, Emergency Drugs available, Suction available, Patient being monitored and Timeout performed Patient Re-evaluated:Patient Re-evaluated prior to induction Oxygen Delivery Method: Circle system utilized Preoxygenation: Pre-oxygenation with 100% oxygen Induction Type: IV induction Ventilation: Mask ventilation without difficulty Laryngoscope Size: McGraph and 3 Grade View: Grade II Tube type: Oral Tube size: 7.0 mm Number of attempts: 1 Airway Equipment and Method: Stylet and Video-laryngoscopy Placement Confirmation: ETT inserted through vocal cords under direct vision,  positive ETCO2 and breath sounds checked- equal and bilateral Secured at: 21 cm Tube secured with: Tape Dental Injury: Teeth and Oropharynx as per pre-operative assessment  Difficulty Due To: Difficulty was anticipated, Difficult Airway- due to reduced neck mobility, Difficult Airway- due to large tongue and Difficult Airway- due to limited oral opening

## 2019-09-26 NOTE — Interval H&P Note (Signed)
History and Physical Interval Note:  09/26/2019 7:07 AM  Gloria Garza  has presented today for surgery, with the diagnosis of Menorrhagia N92.0.  The various methods of treatment have been discussed with the patient and family. After consideration of risks, benefits and other options for treatment, the patient has consented to  Procedure(s): TOTAL LAPAROSCOPIC HYSTERECTOMY WITH SALPINGECTOMY (N/A) as a surgical intervention.  The patient's history has been reviewed, patient examined, no change in status, stable for surgery.  I have reviewed the patient's chart and labs.  Questions were answered to the patient's satisfaction.     Hoyt Koch

## 2019-09-26 NOTE — Discharge Instructions (Signed)
Total Laparoscopic Hysterectomy, Care After °This sheet gives you information about how to care for yourself after your procedure. Your health care provider may also give you more specific instructions. If you have problems or questions, contact your health care provider. °What can I expect after the procedure? °After the procedure, it is common to have: °· Pain and bruising around your incisions. °· A sore throat, if a breathing tube was used during surgery. °· Fatigue. °· Poor appetite. °· Less interest in sex. °If your ovaries were also removed, it is also common to have symptoms of menopause such as hot flashes, night sweats, and lack of sleep (insomnia). °Follow these instructions at home: °Bathing °· Do not take baths, swim, or use a hot tub until your health care provider approves. You may need to only take showers for 2-3 weeks. °· Keep your bandage (dressing) dry until your health care provider says it can be removed. °Incision care ° °· Follow instructions from your health care provider about how to take care of your incisions. Make sure you: °? Wash your hands with soap and water before you change your dressing. If soap and water are not available, use hand sanitizer. °? Change your dressing as told by your health care provider. °? Leave stitches (sutures), skin glue, or adhesive strips in place. These skin closures may need to stay in place for 2 weeks or longer. If adhesive strip edges start to loosen and curl up, you may trim the loose edges. Do not remove adhesive strips completely unless your health care provider tells you to do that. °· Check your incision area every day for signs of infection. Check for: °? Redness, swelling, or pain. °? Fluid or blood. °? Warmth. °? Pus or a bad smell. °Activity °· Get plenty of rest and sleep. °· Do not lift anything that is heavier than 10 lbs (4.5 kg) for one month after surgery, or as long as told by your health care provider. °· Do not drive or use heavy  machinery while taking prescription pain medicine. °· Do not drive for 24 hours if you were given a medicine to help you relax (sedative). °· Return to your normal activities as told by your health care provider. Ask your health care provider what activities are safe for you. °Lifestyle ° °· Do not use any products that contain nicotine or tobacco, such as cigarettes and e-cigarettes. These can delay healing. If you need help quitting, ask your health care provider. °· Do not drink alcohol until your health care provider approves. °General instructions °· Do not douche, use tampons, or have sex for at least 6 weeks, or as told by your health care provider. °· Take over-the-counter and prescription medicines only as told by your health care provider. °· To monitor yourself for a fever, take your temperature at least once a day during recovery. °· If you struggle with physical or emotional changes after your procedure, speak with your health care provider or a therapist. °· To prevent or treat constipation while you are taking prescription pain medicine, your health care provider may recommend that you: °? Drink enough fluid to keep your urine clear or pale yellow. °? Take over-the-counter or prescription medicines. °? Eat foods that are high in fiber, such as fresh fruits and vegetables, whole grains, and beans. °? Limit foods that are high in fat and processed sugars, such as fried and sweet foods. °· Keep all follow-up visits as told by your health care provider.   This is important. °Contact a health care provider if: °· You have chills or a fever. °· You have redness, swelling, or pain around an incision. °· You have fluid or blood coming from an incision. °· Your incision feels warm to the touch. °· You have pus or a bad smell coming from an incision. °· An incision breaks open. °· You feel dizzy or light-headed. °· You have pain or bleeding when you urinate. °· You have diarrhea, nausea, or vomiting that does not  go away. °· You have abnormal vaginal discharge. °· You have a rash. °· You have pain that does not get better with medicine. °Get help right away if: °· You have a fever and your symptoms suddenly get worse. °· You have severe abdominal pain. °· You have chest pain. °· You have shortness of breath. °· You faint. °· You have pain, swelling, or redness on your leg. °· You have heavy vaginal bleeding with blood clots. °Summary °· After the procedure it is common to have abdominal pain. Your provider will give you medication for this. °· Do not take baths, swim, or use a hot tub until your health care provider approves. °· Do not lift anything that is heavier than 10 lbs (4.5 kg) for one month after surgery, or as long as told by your health care provider. °· Notify your provider if you have any signs or symptoms of infection after the procedure. °This information is not intended to replace advice given to you by your health care provider. Make sure you discuss any questions you have with your health care provider. °Document Released: 08/28/2013 Document Revised: 10/20/2017 Document Reviewed: 01/18/2017 °Elsevier Patient Education © 2020 [employer] ° ° °AMBULATORY SURGERY  °DISCHARGE INSTRUCTIONS ° ° °1) The drugs that you were given will stay in your system until tomorrow so for the next 24 hours you should not: ° °A) Drive an automobile °B) Make any legal decisions °C) Drink any alcoholic beverage ° ° °2) You may resume regular meals tomorrow.  Today it is better to start with liquids and gradually work up to solid foods. ° °You may eat anything you prefer, but it is better to start with liquids, then soup and crackers, and gradually work up to solid foods. ° ° °3) Please notify your doctor immediately if you have any unusual bleeding, trouble breathing, redness and pain at the surgery site, drainage, fever, or pain not relieved by medication. ° ° ° °4) Additional Instructions: ° ° ° ° ° ° ° °Please contact your  physician with any problems or Same Day Surgery at [phone], Monday through Friday 6 am to 4 pm, or [place] [facility] [place] Main number at [phone]. °

## 2019-09-26 NOTE — Op Note (Signed)
Operative Report:  PRE-OP DIAGNOSIS: Menorrhagia N92.0   POST-OP DIAGNOSIS: Menorrhagia N92.0   PROCEDURE: Procedure(s): HYSTERECTOMY TOTAL LAPAROSCOPIC CYSTOSCOPY  SURGEON: Barnett Applebaum, MD, FACOG  ASSISTANT: Tech   ANESTHESIA: General endotracheal anesthesia  ESTIMATED BLOOD LOSS: 600 mL  SPECIMENS: Uterus  COMPLICATIONS: None  DISPOSITION: stable to PACU  FINDINGS: Intraabdominal adhesions were noted with omentum to abdominal wall in upper abdomen, none in area of hysterectomy. Ovaries normal.  PROCEDURE:  The patient was taken to the OR where anesthesia was administed. She was prepped and draped in the normal sterile fashion in the dorsal lithotomy position in the Gough stirrups. A time out was performed. A Graves speculum was inserted, the cervix was grasped with a single tooth tenaculum and the endometrial cavity was sounded. The cervix was progressively dilated to a size 18 [place] with [employer] dilators. A V-Care uterine manipulator was inserted in the usual fashion without incident. Gloves were changed and attention was turned to the abdomen.   An infraumbilical transverse 49mm skin incision was made with the scalpel after local anesthesia applied to the skin. A Veress-step needle was inserted in the usual fashion and confirmed using the hanging drop technique. A pneumoperitoneum was obtained by insufflation of CO2 (opening pressure of 69mmHg) to 42mmHg. A diagnostic laparoscopy was performed yielding the previously described findings. Attention was turned to the left lower quadrant where after visualization of the inferior epigastric vessels a 29mm skin incision was made with the scalpel. A 5 mm laparoscopic port was inserted. The same procedure was repeated in the right lower quadrant with a 3mm trocar. Attention was turned to the left aspect of the uterus, where after visualization of the ureter, the round ligament was coagulated and transected using the 34mm Harmonic Scapel. The  anterior and posterior leafs of the broad ligament were dissected off as the anterior one was coagulated and transected in a caudal direction towards the cuff of the uterine manipulator. Tubes were previously removed, and none visible. The uterine-ovarian ligament and its blood vessels were carefully coagulated and transected using the Harmonic scapel.  Attention was turned to the right aspect of the uterus where the same procedure was performed.  The vesicouterine reflection of the peritoneum was dissected with the harmonic scapel and the bladder flap was created bluntly.  The uterine vessels were coagulated and transected bilaterally using first bipolar cautery and then the harmonic scapel. A 360 degree, circumferential colpotomy was done to completely amputate the uterus with cervix and tubes. Once the specimen was amputated it was delivered through the vagina.   The colpotomy was repaired in a simple running fashion using a delayed absorbable suture with an endo-stitch device.  Vaginal exam confirms complete closure.  The cavity was copiously irrigated. A survey of the pelvic cavity revealed adequate hemostasis and no injury to bowel, bladder, or ureter.   A diagnostic cystoscopy was performed using saline distension of bladder with no lesions or injuries noted.  Bilateral urine flow from each ureteral orifice is visualized.  At this point the procedure was finalized. Right lower quadrant fascia incision is closed with a vicryl suture. All the instruments were removed from the patient's body. Gas was expelled and patient is leveled.  Incisions are closed with skin adhesive.    Patient goes to recovery room in stable condition.  All sponge, instrument, and needle counts are correct x2.     Barnett Applebaum, MD, [facility], [facility] 09/26/2019  10:00 AM

## 2019-09-26 NOTE — Progress Notes (Signed)
Day of Surgery Procedure: HYSTERECTOMY TOTAL LAPAROSCOPIC with CYSTOSCOPY  Subjective: Patient reports continued crampy lower abdominal pains and nausea.  Decision made to keep overnight..    Objective: I have reviewed patient's vital signs, intake and output, medications and labs.  Abd: Mod T, ND Incision: clean, dry and intact Extr: no calf T, no edema  Assessment: s/p Procedure(s): HYSTERECTOMY TOTAL LAPAROSCOPIC (N/A) CYSTOSCOPY: stable and with enough pain and nausea to warrant overnight observation  Plan: Advance diet Encourage ambulation Advance to PO medication  Dilaudid IV for pain as needed Hgb in am Resume home meds   LOS: 0 days    Hoyt Koch 09/26/2019, 12:28 PM

## 2019-09-27 ENCOUNTER — Encounter: Payer: Self-pay | Admitting: Obstetrics & Gynecology

## 2019-09-27 DIAGNOSIS — N920 Excessive and frequent menstruation with regular cycle: Secondary | ICD-10-CM | POA: Diagnosis not present

## 2019-09-27 LAB — SURGICAL PATHOLOGY

## 2019-09-27 LAB — HEMOGLOBIN: Hemoglobin: 9.8 g/dL — ABNORMAL LOW (ref 12.0–15.0)

## 2019-09-27 MED ORDER — ONDANSETRON HCL 4 MG PO TABS: 4.0000 mg | ORAL_TABLET | Freq: Four times a day (QID) | ORAL | 0 refills | Status: DC | PRN

## 2019-09-27 NOTE — Anesthesia Postprocedure Evaluation (Signed)
Anesthesia Post Note  Patient: Uva Borseth  Procedure(s) Performed: HYSTERECTOMY TOTAL LAPAROSCOPIC (N/A ) CYSTOSCOPY  Patient location during evaluation: PACU Anesthesia Type: General Level of consciousness: awake and alert Pain management: pain level controlled Vital Signs Assessment: post-procedure vital signs reviewed and stable Respiratory status: spontaneous breathing, nonlabored ventilation, respiratory function stable and patient connected to nasal cannula oxygen Cardiovascular status: blood pressure returned to baseline and stable Postop Assessment: no apparent nausea or vomiting Anesthetic complications: no     Last Vitals:  Vitals:   09/27/19 0337 09/27/19 0752  BP: 115/80 117/66  Pulse: 87 85  Resp: 20 18  Temp: 36.8 C 36.7 C  SpO2: 97% 99%    Last Pain:  Vitals:   09/27/19 0832  TempSrc:   PainSc: 5                  Molli Barrows

## 2019-09-27 NOTE — Progress Notes (Signed)
Patient discharged home. Discharge instructions, prescriptions and follow up appointment given to and reviewed with patient. Patient verbalized understanding. Patient sent home with incision kit.

## 2019-09-27 NOTE — Discharge Summary (Signed)
Gynecology Physician Postoperative Discharge Summary  Patient ID: Gloria Garza MRN: [id number] DOB/AGE: [birth year] 38 y.o.  Admit Date: 09/26/2019 Discharge Date: 09/27/2019  Preoperative Diagnoses: Menorrhagia  Procedures: Procedure(s) (LRB): HYSTERECTOMY TOTAL LAPAROSCOPIC (N/A) CYSTOSCOPY  Significant Labs: CBC Latest Ref Rng & Units 09/27/2019 09/23/2019 05/22/2019  WBC 4.0 - 10.5 K/uL - 6.8 9.0  Hemoglobin 12.0 - 15.0 g/dL 9.8(L) 13.3 12.5  Hematocrit 36.0 - 46.0 % - 42.0 39.2  Platelets 150 - 400 K/uL - 190 -    Hospital Course:  Gloria Garza is a 38 y.o. [id number]  admitted for scheduled surgery.  She underwent the procedures as mentioned above, her operation was uncomplicated. For further details about surgery, please refer to the operative report. Patient had an uncomplicated postoperative course. By time of discharge on POD#1, her pain was controlled on oral pain medications; she was ambulating, voiding without difficulty, tolerating regular diet and passing flatus. She was deemed stable for discharge to home.   Discharge Exam: Blood pressure 115/80, pulse 87, temperature 98.3 F (36.8 C), temperature source Oral, resp. rate 20, height 5\' 7"  (1.702 m), weight 129.3 kg, last menstrual period 08/30/2019, SpO2 97 %. General appearance: alert and no distress  Resp: clear to auscultation bilaterally  Cardio: regular rate and rhythm  GI: soft, non-tender; bowel sounds normal; no masses, no organomegaly.  Incision: C/D/I, no erythema, no drainage noted Pelvic: scant blood on pad  Extremities: extremities normal, atraumatic, no cyanosis or edema and Homans sign is negative, no sign of DVT  Discharged Condition: Stable  Disposition: Discharge disposition: 01-Home or Self Care       Discharge Instructions    Call MD for:  persistant nausea and vomiting   Complete by: As directed    Call MD for:  persistant nausea and vomiting   Complete by: As directed    Call MD for:  redness,  tenderness, or signs of infection (pain, swelling, redness, odor or green/yellow discharge around incision site)   Complete by: As directed    Call MD for:  redness, tenderness, or signs of infection (pain, swelling, redness, odor or green/yellow discharge around incision site)   Complete by: As directed    Call MD for:  severe uncontrolled pain   Complete by: As directed    Call MD for:  severe uncontrolled pain   Complete by: As directed    Call MD for:  temperature >100.4   Complete by: As directed    Call MD for:  temperature >100.4   Complete by: As directed    Change dressing (specify)   Complete by: As directed    Dressing change: remove any dressings tomorrow   Change dressing (specify)   Complete by: As directed    Dressing change: remove any dressings tomorrow   Diet general   Complete by: As directed    Diet general   Complete by: As directed    Discharge instructions   Complete by: As directed    Resume activities according to discharge instruction sheets   Discharge instructions   Complete by: As directed    Resume activities according to discharge instruction sheets   Increase activity slowly   Complete by: As directed    Increase activity slowly   Complete by: As directed      Allergies as of 09/27/2019   No Known Allergies     Medication List    TAKE these medications   divalproex 500 MG 24 hr tablet Commonly known as: DEPAKOTE ER  Take 3 tablets (1,500 mg total) by mouth daily.   MULTI-VITAMIN DAILY PO Take by mouth.   oxyCODONE-acetaminophen 5-325 MG tablet Commonly known as: PERCOCET/ROXICET Take 1 tablet by mouth every 4 (four) hours as needed for moderate pain.   traZODone 100 MG tablet Commonly known as: DESYREL Take 2 tablets (200 mg total) by mouth at bedtime. SLEEP What changed: additional instructions            Discharge Care Instructions  (From admission, onward)         Start     Ordered   09/27/19 0000  Change dressing  (specify)    Comments: Dressing change: remove any dressings tomorrow   09/27/19 0728   09/26/19 0000  Change dressing (specify)    Comments: Dressing change: remove any dressings tomorrow   09/26/19 0959         Follow-up Information    Gae Dry, MD. Go in 2 weeks.   Specialty: Obstetrics and Gynecology Why: For Post Op, As Scheduled Contact information: [address] [place] [id number] [phone]           Barnett Applebaum, MD

## 2019-10-04 ENCOUNTER — Encounter: Payer: Self-pay | Admitting: Obstetrics & Gynecology

## 2019-10-04 ENCOUNTER — Ambulatory Visit (INDEPENDENT_AMBULATORY_CARE_PROVIDER_SITE_OTHER): Admitting: Obstetrics & Gynecology

## 2019-10-04 ENCOUNTER — Other Ambulatory Visit: Payer: Self-pay

## 2019-10-04 VITALS — BP 130/90 | Ht 67.0 in | Wt 296.0 lb

## 2019-10-04 DIAGNOSIS — Z90710 Acquired absence of both cervix and uterus: Secondary | ICD-10-CM | POA: Insufficient documentation

## 2019-10-04 MED ORDER — OXYCODONE-ACETAMINOPHEN 5-325 MG PO TABS: 1.0000 | ORAL_TABLET | ORAL | 0 refills | Status: DC | PRN

## 2019-10-04 NOTE — Progress Notes (Signed)
  Postoperative Follow-up Patient presents post op from [facility] for abnormal uterine bleeding, 1 week ago. PATH: DIAGNOSIS:  A. UTERUS WITH CERVIX; TOTAL HYSTERECTOMY:  - UTERINE CERVIX:    - FOCAL LOW-GRADE SQUAMOUS INTRAEPITHELIAL LESION (CIN-1, MILD  DYSPLASIA WITH HPV RELATED CHANGES).    - NEGATIVE FOR HIGH-GRADE SQUAMOUS INTRAEPITHELIAL LESION AND  MALIGNANCY.    - ECTOCERVICAL MARGIN FREE OF DYSPLASIA.  - ENDOMETRIUM:    - SECRETORY PHASE ENDOMETRIUM.    - NEGATIVE FOR ATYPICAL HYPERPLASIA/EIN AND MALIGNANCY.  - MYOMETRIUM:    - LEIOMYOMA UTERI.    - NEGATIVE FOR FEATURES OF MALIGNANCY.  Subjective: Patient reports some improvement in her preop symptoms. Eating a regular diet without difficulty. Pain is controlled with current analgesics. Medications being used: ibuprofen (OTC) and Percocet.  Activity: sedentary. Patient reports additional symptom's since surgery of No bleeding  Objective: BP 130/90   Ht 5\' 7"  (1.702 m)   Wt 296 lb (134.3 kg)   BMI 46.36 kg/m  Physical Exam Constitutional:      General: She is not in acute distress.    Appearance: She is well-developed.  Cardiovascular:     Rate and Rhythm: Normal rate.  Pulmonary:     Effort: Pulmonary effort is normal.  Abdominal:     General: There is no distension.     Palpations: Abdomen is soft.     Tenderness: There is no abdominal tenderness.     Comments: Incision Healing Well   Musculoskeletal: Normal range of motion.  Neurological:     Mental Status: She is alert and oriented to person, place, and time.     Cranial Nerves: No cranial nerve deficit.  Skin:    General: Skin is warm and dry.    Assessment: s/p :  total laparoscopic hysterectomy with bilateral salpingectomy progressing well  Plan: Patient has done well after surgery with no apparent complications.  I have discussed the post-operative course to date, and the expected progress moving forward.  The patient understands  what complications to be concerned about.  I will see the patient in routine follow up, or sooner if needed.    Activity plan: No heavy lifting.  Pelvic rest. Refill Percocet #`[id number]  Hoyt Koch 10/04/2019, 9:53 AM

## 2019-10-08 ENCOUNTER — Ambulatory Visit: Admitting: Psychology

## 2019-10-09 ENCOUNTER — Telehealth: Payer: Self-pay

## 2019-10-09 NOTE — Telephone Encounter (Signed)
Patient aware Shipman out of office today. Rx denied by other provider. Will send message for [facility] to review when he returns to office tomorrow.

## 2019-10-09 NOTE — Telephone Encounter (Signed)
Patient had surgery (TLH w/Cystocopy) 1.5 wks ago w/[facility]. States he advised her to call if she needed a refill of her pain meds. She was unable to request thru mychart. Cb#[phone]

## 2019-10-09 NOTE — Telephone Encounter (Signed)
So I'd say she shouldn't need any more narcotics at this point, she can wait for [facility] to see if he wants to refill or evaluate her

## 2019-10-10 ENCOUNTER — Other Ambulatory Visit: Payer: Self-pay | Admitting: Obstetrics & Gynecology

## 2019-10-10 MED ORDER — MELOXICAM 7.5 MG PO TABS: 7.5000 mg | ORAL_TABLET | Freq: Two times a day (BID) | ORAL | 1 refills | Status: DC | PRN

## 2019-10-10 NOTE — Telephone Encounter (Signed)
Will call in alternative pain medicine for any residual healing pains (Meloxicam), a non-narcotic good option for her.

## 2019-10-10 NOTE — Telephone Encounter (Signed)
Patient aware.

## 2019-10-14 ENCOUNTER — Encounter: Payer: Self-pay | Admitting: Dietician

## 2019-10-14 ENCOUNTER — Encounter: Attending: General Surgery | Admitting: Dietician

## 2019-10-14 ENCOUNTER — Other Ambulatory Visit: Payer: Self-pay

## 2019-10-14 DIAGNOSIS — R7303 Prediabetes: Secondary | ICD-10-CM | POA: Insufficient documentation

## 2019-10-14 DIAGNOSIS — Z713 Dietary counseling and surveillance: Secondary | ICD-10-CM | POA: Insufficient documentation

## 2019-10-14 DIAGNOSIS — Z833 Family history of diabetes mellitus: Secondary | ICD-10-CM | POA: Insufficient documentation

## 2019-10-14 DIAGNOSIS — Z8249 Family history of ischemic heart disease and other diseases of the circulatory system: Secondary | ICD-10-CM | POA: Insufficient documentation

## 2019-10-14 DIAGNOSIS — F319 Bipolar disorder, unspecified: Secondary | ICD-10-CM | POA: Insufficient documentation

## 2019-10-14 DIAGNOSIS — Z6841 Body Mass Index (BMI) 40.0 and over, adult: Secondary | ICD-10-CM | POA: Insufficient documentation

## 2019-10-14 NOTE — Patient Instructions (Signed)
   Gradually increase daily physical activity as you continue to recover from surgery.   Continue to emphasize lean protein foods and low-carb veggies, keep portions of starchy foods small.

## 2019-10-14 NOTE — Progress Notes (Signed)
Appt start time: 1620 end time:  1645.  Assessment:   #4 SWL Appointment.   Start Wt at NDES: 281.1lbs Wt: 293.6lbs Ht: 5'7" BMI: 45.98   Learning Readiness:   Change in progress  MEDICATIONS: divalproex, meloxicam prn, ondansetron, traZODone, multivitamin  Progress:  Patient had hysterectomy surgery on 09/26/19. She has been on rest since then, just began some ADLs  She has a virtual appointment with psychiatrist 12/2019 to discuss bipolar med which has side effect of weight gain.  Dietary intake:  Breakfast: boiled eggs with avocado or bacon; oatmeal squares cereal  Snack: fruit  Lunch: tuna and crackers or caesar salad with chicken Snack: raw veg ie carrots and celery and cheese Dinner: meat + low-carb veg; pasta made from veg ie zucchini noodles Snack: fruit (craves something sweet) ie canned fruit cocktail no added sugar Beverages: water; occasionally juice; stopped sodas  Usual physical activity: resuming ADLs after surgery  Diet to Follow: Continue current eating pattern               Nutritional Diagnosis:  [place]-3.3 Overweight/obesity related to history of excess calories and physical inactivity as evidenced by patient current BMI of 45.98, following dietary guidelines to promote weight loss prior to bariatric surgery.              Intervention:   . Nutrition counseling for weight loss prior to upcoming bariatric surgery.  Teaching Method Utilized:  Visual Auditory   Handouts given during visit include:  Goals and instructions   Barriers to learning/adherence to lifestyle change: none  Demonstrated degree of understanding via:  Teach Back   Monitoring/Evaluation:  Dietary intake, exercise, and body weight 11/11/19 at 4:30pm.

## 2019-10-21 ENCOUNTER — Ambulatory Visit (INDEPENDENT_AMBULATORY_CARE_PROVIDER_SITE_OTHER): Admitting: Nurse Practitioner

## 2019-10-21 ENCOUNTER — Other Ambulatory Visit: Payer: Self-pay

## 2019-10-21 ENCOUNTER — Encounter: Payer: Self-pay | Admitting: Nurse Practitioner

## 2019-10-21 VITALS — BP 144/49 | HR 89 | Resp 16 | Ht 67.0 in | Wt 292.6 lb

## 2019-10-21 DIAGNOSIS — L0501 Pilonidal cyst with abscess: Secondary | ICD-10-CM | POA: Diagnosis not present

## 2019-10-21 MED ORDER — SULFAMETHOXAZOLE-TRIMETHOPRIM 800-160 MG PO TABS: 1.0000 | ORAL_TABLET | Freq: Two times a day (BID) | ORAL | 0 refills | Status: DC

## 2019-10-21 MED ORDER — LIDOCAINE VISCOUS HCL 2 % MT SOLN: OROMUCOSAL | 0 refills | Status: DC

## 2019-10-21 NOTE — Progress Notes (Signed)
[facility] [place], [place] [address]  Internal MEDICINE  Office Visit Note  Patient Name: Gloria Garza  [id number]  [id number]  Date of Service: 10/21/2019  Chief Complaint  Patient presents with  . Recurrent Skin Infections    abscess/boil on buttock, on and off for 7 years, first time its been this bad     The patient is here for acute visit. She has abscess at the base of her spine, in between the buttocks. She states that inflammation started about a week ago and gradually have become worse. She states that this is very painful. Hurts to sit at all. She denies fever or chills, but does hvve a headache.   Pt is here for a sick visit.     Current Medication:  Outpatient Encounter Medications as of 10/21/2019  Medication Sig  . divalproex (DEPAKOTE ER) 500 MG 24 hr tablet Take 3 tablets (1,500 mg total) by mouth daily.  . meloxicam (MOBIC) 7.5 MG tablet Take 1 tablet (7.5 mg total) by mouth 2 (two) times daily as needed for pain.  . Multiple Vitamin (MULTI-VITAMIN DAILY PO) Take by mouth.  . ondansetron (ZOFRAN) 4 MG tablet Take 1 tablet (4 mg total) by mouth every 6 (six) hours as needed for nausea.  . traZODone (DESYREL) 100 MG tablet Take 2 tablets (200 mg total) by mouth at bedtime. SLEEP (Patient taking differently: Take 200 mg by mouth at bedtime. )  . lidocaine (XYLOCAINE) 2 % solution Apply small amount to affected area three to four times per day as needed  . sulfamethoxazole-trimethoprim (BACTRIM DS) 800-160 MG tablet Take 1 tablet by mouth 2 (two) times daily.   No facility-administered encounter medications on file as of 10/21/2019.       Medical History: Past Medical History:  Diagnosis Date  . Anxiety   . Bipolar disorder ([place])   . Depression   . Gestational diabetes     Today's Vitals   10/21/19 1359  BP: (!) 144/49  Pulse: 89  Resp: 16  SpO2: 100%  Weight: 292 lb 9.6 oz (132.7 kg)  Height: 5\' 7"  (1.702 m)   Body  mass index is 45.83 kg/m.   Review of Systems  Constitutional: Positive for fatigue. Negative for chills and unexpected weight change.  HENT: Negative for congestion, postnasal drip, rhinorrhea, sneezing and sore throat.   Respiratory: Negative for cough, chest tightness and shortness of breath.   Cardiovascular: Negative for chest pain and palpitations.  Musculoskeletal: Negative for arthralgias, back pain, joint swelling and neck pain.  Skin: Negative for rash.       Abscess at the base of her back, in between the buttocks.   Neurological: Positive for headaches. Negative for tremors and numbness.  Hematological: Negative for adenopathy. Does not bruise/bleed easily.  Psychiatric/Behavioral: Negative for behavioral problems (Depression), sleep disturbance and suicidal ideas. The patient is not nervous/anxious.     Physical Exam Vitals signs and nursing note reviewed.  Constitutional:      General: She is not in acute distress.    Appearance: Normal appearance. She is well-developed. She is not diaphoretic.  HENT:     Head: Normocephalic and atraumatic.     Mouth/Throat:     Pharynx: No oropharyngeal exudate.  Eyes:     Pupils: Pupils are equal, round, and reactive to light.  Neck:     Musculoskeletal: Normal range of motion and neck supple.     Thyroid: No thyromegaly.  Vascular: No JVD.     Trachea: No tracheal deviation.  Cardiovascular:     Rate and Rhythm: Normal rate and regular rhythm.     Heart sounds: Normal heart sounds. No murmur. No friction rub. No gallop.   Pulmonary:     Effort: Pulmonary effort is normal. No respiratory distress.     Breath sounds: No wheezing or rales.  Chest:     Chest wall: No tenderness.  Abdominal:     Palpations: Abdomen is soft.  Musculoskeletal: Normal range of motion.  Lymphadenopathy:     Cervical: No cervical adenopathy.  Skin:    General: Skin is warm and dry.       Neurological:     Mental Status: She is alert and  oriented to person, place, and time.     Cranial Nerves: No cranial nerve deficit.  Psychiatric:        Behavior: Behavior normal.        Thought Content: Thought content normal.        Judgment: Judgment normal.    Assessment/Plan:  1. Pilonidal abscess Start bactrim DS bid for 10 days. Added viscous lidocaine. Apply to affected area three to four times daily as needed for pain  Recommended applying warm compress and sitting on donut pillow. Urgent referral made to general surgery for further evaluation and treatment.  - sulfamethoxazole-trimethoprim (BACTRIM DS) 800-160 MG tablet; Take 1 tablet by mouth 2 (two) times daily.  Dispense: 20 tablet; Refill: 0 - lidocaine (XYLOCAINE) 2 % solution; Apply small amount to affected area three to four times per day as needed  Dispense: 100 mL; Refill: 0 - Ambulatory referral to General Surgery  General Counseling: [name] verbalizes understanding of the findings of todays visit and agrees with plan of treatment. I have discussed any further diagnostic evaluation that may be needed or ordered today. We also reviewed her medications today. she has been encouraged to call the office with any questions or concerns that should arise related to todays visit.    Counseling:  This patient was seen by Leretha Pol FNP Collaboration with Dr Lavera Guise as a part of collaborative care agreement  Orders Placed This Encounter  Procedures  . Ambulatory referral to General Surgery    Meds ordered this encounter  Medications  . sulfamethoxazole-trimethoprim (BACTRIM DS) 800-160 MG tablet    Sig: Take 1 tablet by mouth 2 (two) times daily.    Dispense:  20 tablet    Refill:  0    Order Specific Question:   Supervising Provider    Answer:   Lavera Guise [id number]  . lidocaine (XYLOCAINE) 2 % solution    Sig: Apply small amount to affected area three to four times per day as needed    Dispense:  100 mL    Refill:  0    Order Specific Question:    Supervising Provider    Answer:   Lavera Guise [[id number]]    Time spent: 25 Minutes

## 2019-10-22 ENCOUNTER — Encounter: Payer: Self-pay | Admitting: Surgery

## 2019-10-22 ENCOUNTER — Ambulatory Visit (INDEPENDENT_AMBULATORY_CARE_PROVIDER_SITE_OTHER): Admitting: Surgery

## 2019-10-22 ENCOUNTER — Other Ambulatory Visit: Payer: Self-pay

## 2019-10-22 ENCOUNTER — Ambulatory Visit: Admitting: Psychology

## 2019-10-22 VITALS — BP 128/84 | HR 96 | Temp 97.7°F | Resp 14 | Ht 67.0 in | Wt 291.8 lb

## 2019-10-22 DIAGNOSIS — L0501 Pilonidal cyst with abscess: Secondary | ICD-10-CM

## 2019-10-22 NOTE — Patient Instructions (Addendum)
Please remove the packing and shower as usual. Replace new packing into wound using a Qtip making sure to not force the packing material into the wound. Do this twice daily.    Pilonidal Cyst Drainage, Care After This sheet gives you information about how to care for yourself after your procedure. Your health care provider may also give you more specific instructions. If you have problems or questions, contact your health care provider. What can I expect after the procedure? After the procedure, it is common to have:  Pain that gets better when you take medicine.  Some fluid or blood coming from your wound. Follow these instructions at home: Medicines  Take over-the-counter and prescription medicines only as told by your health care provider.  If you were prescribed an antibiotic medicine, take it as told by your health care provider. Do not stop taking the antibiotic even if you start to feel better. Lifestyle  Do not do activities that irritate or put pressure on your buttocks for about 2 weeks, or as long as told by your health care provider. These activities include bike riding, running, and anything that involves a twisting motion.  Do not sit for long periods at a time without getting up to move around.  Sleep on your side instead of your back.  Avoid wearing tight underwear and tight pants. Bathing  Do not take baths or showers, swim, or use a hot tub until your health care provider approves. This depends on the type of wound you have from surgery.  While bathing, clean your buttocks area gently with soap and water.  After bathing: ? Pat the area dry with a soft, clean towel. ? Cover the area with a clean bandage (dressing), if told to by your health care provider. General instructions   If you are taking prescription pain medicine, take actions to prevent or treat constipation. Your health care provider may recommend that you: ? Drink enough fluid to keep your urine  pale yellow. ? Eat foods that are high in fiber, such as fresh fruits and vegetables, whole grains, and beans. ? Limit foods that are high in fat and processed sugars, such as fried or sweet foods. ? Take an over-the-counter or prescription medicine for constipation.  You will need to have a caregiver help you manage wound care and dressing changes. Your caregiver should: ? Wash his or her hands with soap and water before changing your dressing. If soap and water are not available, your caregiver should use hand sanitizer. ? Check your wound every day for signs of infection, such as:  Redness, swelling, or more pain.  More fluid or blood.  Warmth.  Pus or a bad smell. ? Follow any additional instructions from your health care provider on how to care for your wound, such as wound cleaning, wound flushing (irrigation), or packing your wound with a dressing.  Keep all follow-up visits as told by your health care provider. This is important. If you had incision and drainage with wound packing:  Return to your health care provider as instructed to have your packing material changed or removed.  Keep the area dry until your packing has been removed.  After the packing has been removed, you may start taking showers. If you had marsupialization:  You may start taking showers the day after surgery, or when your health care provider approves.  Remove your dressing before you shower, but let the water from the shower moisten your dressing before you remove it.  This will make it easier to remove.  Ask your health care provider when you can stop using a dressing. If you had incision and drainage without wound packing:  Change your dressing as directed.  Leave stitches (sutures), skin glue, or adhesive strips in place. These skin closures may need to stay in place for 2 weeks or longer. If adhesive strip edges start to loosen and curl up, you may trim the loose edges. Do not remove adhesive  strips completely unless your health care provider tells you to do that. Contact a health care provider if:  You have redness, swelling, or more pain around your wound.  You have more fluid or blood coming from your wound.  You have new bleeding from your wound.  Your wound feels warm to the touch.  There is pus or a bad smell coming from your wound.  You have pain that does not get better with medicine.  You have a fever or chills.  You have muscle aches.  You are dizzy.  You feel generally sick. Summary  After a procedure to drain a pilonidal cyst, it is common to have some fluid or blood coming from your wound.  If you were prescribed an antibiotic medicine, take it as told by your health care provider. Do not stop taking the antibiotic even if you start to feel better.  Return to your health care provider as instructed to have any packing material changed or removed. This information is not intended to replace advice given to you by your health care provider. Make sure you discuss any questions you have with your health care provider. Document Released: 12/08/2006 Document Revised: 08/30/2018 Document Reviewed: 10/30/2017 Elsevier Patient Education  2020 [employer].

## 2019-10-22 NOTE — Progress Notes (Signed)
Patient ID: Gloria Garza, female   DOB: 11/27/1980, 38 y.o.   MRN: [id number]  Chief Complaint: Pilonidal abscess History of Present Illness Gloria Garza is a 38 y.o. female with pilonidal abscess developing over the last week.  Was excruciating, no spontaneous drainage, no prior similar scenario, no prior incision and drainage.  Began taking antibiotics over the last day or more and has seen some slight improvement.  She continues to have excruciating pain, denies fevers and chills.  Past Medical History Past Medical History:  Diagnosis Date  . Anxiety   . Bipolar disorder ([place])   . Depression   . Gestational diabetes       Past Surgical History:  Procedure Laterality Date  . CESAREAN SECTION    . CYSTOSCOPY  09/26/2019   Procedure: CYSTOSCOPY;  Surgeon: Gae Dry, MD;  Location: [facility] ORS;  Service: Gynecology;;  . LAPAROSCOPIC HYSTERECTOMY N/A 09/26/2019   Procedure: HYSTERECTOMY TOTAL LAPAROSCOPIC;  Surgeon: Gae Dry, MD;  Location: [facility] ORS;  Service: Gynecology;  Laterality: N/A;  . TUBAL LIGATION      No Known Allergies  Current Outpatient Medications  Medication Sig Dispense Refill  . divalproex (DEPAKOTE ER) 500 MG 24 hr tablet Take 3 tablets (1,500 mg total) by mouth daily. 270 tablet 0  . lidocaine (XYLOCAINE) 2 % solution Apply small amount to affected area three to four times per day as needed 100 mL 0  . Multiple Vitamin (MULTI-VITAMIN DAILY PO) Take by mouth.    . sulfamethoxazole-trimethoprim (BACTRIM DS) 800-160 MG tablet Take 1 tablet by mouth 2 (two) times daily. 20 tablet 0  . traZODone (DESYREL) 100 MG tablet Take 2 tablets (200 mg total) by mouth at bedtime. SLEEP (Patient taking differently: Take 200 mg by mouth at bedtime. ) 180 tablet 0   No current facility-administered medications for this visit.     Family History Family History  Problem Relation Age of Onset  . Diabetes Maternal Grandmother   . Cancer - Colon Maternal Grandmother   .  Diabetes Paternal Grandmother   . Heart disease Paternal Grandmother   . Alcohol abuse Brother   . Drug abuse Brother       Social History Social History   Tobacco Use  . Smoking status: Former Smoker    Types: Cigarettes    Quit date: 07/18/2018    Years since quitting: 1.2  . Smokeless tobacco: Never Used  Substance Use Topics  . Alcohol use: Not Currently    Comment: none recently  . Drug use: Never        Review of Systems  Constitutional: Negative.   HENT: Negative.   Eyes: Negative.   Respiratory: Negative.   Cardiovascular: Negative.   Gastrointestinal: Negative.   Genitourinary: Negative.   Musculoskeletal: Negative.   Skin: Negative.   Neurological: Negative.   Endo/Heme/Allergies: Negative.       Physical Exam Blood pressure 128/84, pulse 96, temperature 97.7 F (36.5 C), temperature source Temporal, resp. rate 14, height 5\' 7"  (1.702 m), weight 291 lb 12.8 oz (132.4 kg), SpO2 96 %. Last Weight  Most recent update: 10/22/2019 10:15 AM   Weight  132.4 kg (291 lb 12.8 oz)            CONSTITUTIONAL: Well developed, and nourished, appropriately responsive and aware without distress.  EYES: Sclera non-icteric.   EARS, NOSE, MOUTH AND THROAT: Mask worn.  Hearing is intact to voice.  RESPIRATORY:  Lungs are clear, and breath sounds are equal bilaterally.  Normal respiratory effort without pathologic use of accessory muscles. CARDIOVASCULAR: Heart is regular in rate and rhythm. GI: The abdomen is soft, nontender, and nondistended.  MUSCULOSKELETAL:  Symmetrical muscle tone appreciated in all four extremities.    SKIN: Skin turgor is normal. No pathologic skin lesions appreciated.  3.5 to 4 cm abscess with fluctuance just to the patient's right of midline onto her natal cleft.  Surrounding induration appreciated.  Wrinkling of the skin with some epidermal slough consistent with improving cellulitic changes. NEUROLOGIC:  Motor and sensation appear grossly  normal.  Cranial nerves are grossly without defect. PSYCH:  Alert and oriented to person, place and time. Affect is appropriate for situation.  Data Reviewed I have personally reviewed what is currently available of the patient's imaging, recent labs and medical records.    Assessment    Abscessed pilonidal cyst. Patient Active Problem List   Diagnosis Date Noted  . Pilonidal abscess 10/21/2019  . S/P laparoscopic hysterectomy 10/04/2019  . Bipolar 1 disorder, mixed, moderate ([place]) 05/22/2019  . GAD (generalized anxiety disorder) 05/22/2019  . Primary insomnia 05/22/2019  . Tobacco use disorder 05/22/2019  . Specific phobia 05/22/2019    Plan    Incision and drainage of pilonidal abscess.  Risks and benefits of the procedure discussed in informed consent obtained.  Questions answered.  Procedure detail: With the patient in right lateral decubitus position her natal cleft region was prepped in the usual fashion.  Local infiltration with 1% Xylocaine with epi was completed to adequate anesthetic effect.  Stab incision was made with an 11 blade and extended to allow adequate drainage and access for dressing changes.  Purulent discharge obtained, any loculations were lysed bluntly.  The wound was irrigated, and dressed with quarter inch packing strip.  Instructions given to female spouse present.  Questions answered.  We will have her follow-up within the next month.  Face-to-face time spent with the patient and accompanying care providers(if present) was 20 minutes, with more than 50% of the time spent counseling, educating, and coordinating care of the patient.      Ronny Bacon 10/22/2019, 1:55 PM

## 2019-10-23 ENCOUNTER — Other Ambulatory Visit: Payer: Self-pay | Admitting: Surgery

## 2019-10-23 ENCOUNTER — Telehealth: Payer: Self-pay

## 2019-10-23 MED ORDER — HYDROCODONE-ACETAMINOPHEN 5-325 MG PO TABS: 1.0000 | ORAL_TABLET | Freq: Four times a day (QID) | ORAL | 0 refills | Status: DC | PRN

## 2019-10-23 MED ORDER — HYDROCODONE-IBUPROFEN 5-200 MG PO TABS: 1.0000 | ORAL_TABLET | Freq: Three times a day (TID) | ORAL | 0 refills | Status: DC | PRN

## 2019-10-23 NOTE — Progress Notes (Signed)
Will send Rx for Vicoprofen to assist with pain for the next few days.

## 2019-10-23 NOTE — Telephone Encounter (Signed)
Patients wife came to office due to not having any iodoform in the bottle that was provided to her yesterday.  She states the patient has excruciating pain during the wound packing.  She is taking Tylenol and ibuprofen with no relief.  She is requesting a stronger pain medication at this time.   I will send Dr.Rodenberg a message and await his response.  Spoke with wife to let her know she can pick up prescription at drug store later today. Dr.Rodenberg will send in prescription. Continue taking ibuprofen.

## 2019-11-11 ENCOUNTER — Ambulatory Visit (INDEPENDENT_AMBULATORY_CARE_PROVIDER_SITE_OTHER): Admitting: Obstetrics & Gynecology

## 2019-11-11 ENCOUNTER — Other Ambulatory Visit: Payer: Self-pay

## 2019-11-11 ENCOUNTER — Encounter: Payer: Self-pay | Admitting: Obstetrics & Gynecology

## 2019-11-11 ENCOUNTER — Ambulatory Visit: Admitting: Dietician

## 2019-11-11 VITALS — BP 140/90 | Ht 67.0 in | Wt 292.0 lb

## 2019-11-11 DIAGNOSIS — Z90710 Acquired absence of both cervix and uterus: Secondary | ICD-10-CM

## 2019-11-11 NOTE — Progress Notes (Signed)
  Postoperative Follow-up Patient presents post op from [facility] for abnormal uterine bleeding, 6 weeks ago.  Subjective: Patient reports marked improvement in her preop symptoms. Eating a regular diet without difficulty. The patient is not having any pain.  Activity: normal activities of daily living. Patient reports additional symptom's since surgery of occas bleeding vaginally, light, last time yesterday  Objective: BP 140/90   Ht 5\' 7"  (1.702 m)   Wt 292 lb (132.5 kg)   BMI 45.73 kg/m  Physical Exam Constitutional:      General: She is not in acute distress.    Appearance: She is well-developed.  Genitourinary:     Pelvic exam was performed with patient supine.     Vagina and rectum normal.     No vaginal erythema or bleeding.     No right or left adnexal mass present.     Right adnexa not tender.     Left adnexa not tender.     Genitourinary Comments: Cervix and uterus absent. Vaginal cuff healing well although qtip pink to touch  Cardiovascular:     Rate and Rhythm: Normal rate.  Pulmonary:     Effort: Pulmonary effort is normal.  Abdominal:     General: There is no distension.     Palpations: Abdomen is soft.     Tenderness: There is no abdominal tenderness.     Comments: Incision healing well.  Musculoskeletal:        General: Normal range of motion.  Neurological:     Mental Status: She is alert and oriented to person, place, and time.     Cranial Nerves: No cranial nerve deficit.  Skin:    General: Skin is warm and dry.     Assessment: s/p :  total laparoscopic hysterectomy with bilateral salpingectomy progressing well  Plan: Patient has done well after surgery with no apparent complications.  I have discussed the post-operative course to date, and the expected progress moving forward.  The patient understands what complications to be concerned about.  I will see the patient in routine follow up, or sooner if needed.    Activity plan: No restriction. Monitor  for vag bleeding, expect to heal over next few weeks completely without need for further intervention  Hoyt Koch 11/11/2019, 11:16 AM

## 2019-11-13 ENCOUNTER — Encounter: Attending: General Surgery | Admitting: Dietician

## 2019-11-13 ENCOUNTER — Ambulatory Visit: Admitting: Dietician

## 2019-11-13 ENCOUNTER — Other Ambulatory Visit: Payer: Self-pay

## 2019-11-13 ENCOUNTER — Encounter: Payer: Self-pay | Admitting: Dietician

## 2019-11-13 VITALS — Ht 67.0 in | Wt 288.9 lb

## 2019-11-13 DIAGNOSIS — Z713 Dietary counseling and surveillance: Secondary | ICD-10-CM | POA: Insufficient documentation

## 2019-11-13 DIAGNOSIS — F319 Bipolar disorder, unspecified: Secondary | ICD-10-CM | POA: Insufficient documentation

## 2019-11-13 DIAGNOSIS — Z833 Family history of diabetes mellitus: Secondary | ICD-10-CM | POA: Diagnosis not present

## 2019-11-13 DIAGNOSIS — Z8249 Family history of ischemic heart disease and other diseases of the circulatory system: Secondary | ICD-10-CM | POA: Insufficient documentation

## 2019-11-13 DIAGNOSIS — Z6841 Body Mass Index (BMI) 40.0 and over, adult: Secondary | ICD-10-CM | POA: Diagnosis not present

## 2019-11-13 DIAGNOSIS — R7303 Prediabetes: Secondary | ICD-10-CM | POA: Diagnosis not present

## 2019-11-13 NOTE — Patient Instructions (Signed)
   Great job increasing your physical activity, keep up the awesome work!  Continue with generally low-carb meals, and emphasize lean proteins and plenty of low-carb veggies.   Check out the baritastic app for low-carb food ideas after surgery.

## 2019-11-13 NOTE — Progress Notes (Signed)
Appt start time: 1430 and 1530 end time:  1500.  Assessment:   #5 SWL Appointment.   Start Wt at NDES: 285.1lbs Wt: 288.9lbs Ht: 5'7" BMI: 45.25  Learning Readiness:   Change in progress  MEDICATIONS: divalproex, traZODone, multivitamin  Progress:  Weight loss of 4.7lbs since previous visit on 10/14/19.   Patient reports increased physical activity in the last 2 weeks due to holiday shopping and other general activity,  Has stopped eating after 7pm, cooking dinner earlier.  She also reports effort to further reduce portions of starchy foods.  Dietary intake:  Breakfast: boiled eggs with avocado or bacon; oatmeal squares cereal  Snack: fruit  Lunch: tuna salad on whole wheat bread + carrots; salad with chicken; veg and fruit plate Snack: raw veg occ with cheese; peanut butter with apple slices Dinner: meat (tried salmon and enjoyed it) + low-carb veg Snack: none or fruit Beverages: water; orange juice rarely  Usual physical activity: increased general activity  Diet to Follow: Continue with current eating pattern              Nutritional Diagnosis:  [place]-3.3 Overweight/obesity related to history of excess calories and physical inactivity as evidenced by patient current BMI of 45.25 , following dietary guidelines for continued weight loss prior to bariatric surgery.              Intervention:   . Nutrition counseling for weight loss prior to upcoming bariatric surgery. . Reviewed progress since previous visit, commended patient for positive changes leading to weight loss.  . Discussed options for alternates to favorite starchy foods ie pasta, pizza; encouraged use of bariatric resources for additional ideas.  Teaching Method Utilized:  Visual Auditory Hands on  Handouts given during visit include:  Goals and Instructions (AVS)   Barriers to learning/adherence to lifestyle change: none  Demonstrated degree of understanding via:  Teach Back    Monitoring/Evaluation:  Dietary intake, exercise, and body weight 12/06/19 at 9:00am.

## 2019-11-20 ENCOUNTER — Telehealth: Payer: Self-pay

## 2019-11-20 NOTE — Telephone Encounter (Signed)
Called lmom informing patient of appointment. klh 

## 2019-11-25 ENCOUNTER — Other Ambulatory Visit: Payer: Self-pay

## 2019-11-25 ENCOUNTER — Ambulatory Visit (INDEPENDENT_AMBULATORY_CARE_PROVIDER_SITE_OTHER): Admitting: Adult Health

## 2019-11-25 ENCOUNTER — Encounter: Payer: Self-pay | Admitting: Adult Health

## 2019-11-25 VITALS — BP 140/88 | HR 80 | Temp 97.3°F | Resp 16 | Ht 67.0 in | Wt 298.8 lb

## 2019-11-25 DIAGNOSIS — L0501 Pilonidal cyst with abscess: Secondary | ICD-10-CM

## 2019-11-25 NOTE — Progress Notes (Signed)
[facility] [place], [place] [address]  Internal MEDICINE  Office Visit Note  Patient Name: Gloria Garza  [id number]  [id number]  Date of Service: 11/25/2019  Chief Complaint  Patient presents with   Anxiety    HPI Patient is here to follow-up on her pilonidal cyst that was recently I/D'd by general surgery. She says that the cyst is healing well and not requiring any more packing with dressing changes at this time, denies any more pain at this time. Also recently had a hysterectomy, recovering well from that procedure, no further issues or complaints. Has plans to schedule her gastric weight loss surgery in the near future, has one further appointment with her dietician and then will be able to schedule her procedure with the surgeon. Is looking forward to this surgery.   Current Medication: Outpatient Encounter Medications as of 11/25/2019  Medication Sig   divalproex (DEPAKOTE ER) 500 MG 24 hr tablet Take 3 tablets (1,500 mg total) by mouth daily.   Multiple Vitamin (MULTI-VITAMIN DAILY PO) Take by mouth.   traZODone (DESYREL) 100 MG tablet Take 100 mg by mouth at bedtime.   No facility-administered encounter medications on file as of 11/25/2019.    Surgical History: Past Surgical History:  Procedure Laterality Date   CESAREAN SECTION     CYSTOSCOPY  09/26/2019   Procedure: CYSTOSCOPY;  Surgeon: Gae Dry, MD;  Location: [facility] ORS;  Service: Gynecology;;   LAPAROSCOPIC HYSTERECTOMY N/A 09/26/2019   Procedure: HYSTERECTOMY TOTAL LAPAROSCOPIC;  Surgeon: Gae Dry, MD;  Location: [facility] ORS;  Service: Gynecology;  Laterality: N/A;   TUBAL LIGATION      Medical History: Past Medical History:  Diagnosis Date   Anxiety    Bipolar disorder ([place])    Depression     Family History: Family History  Problem Relation Age of Onset   Diabetes Maternal Grandmother    Cancer - Colon Maternal Grandmother    Diabetes Paternal Grandmother      Heart disease Paternal Grandmother    Alcohol abuse Brother    Drug abuse Brother     Social History   Socioeconomic History   Marital status: Married    Spouse name: terah   Number of children: 2   Years of education: Not on file   Highest education level: Associate degree: occupational, Hotel manager, or vocational program  Occupational History   Not on file  Tobacco Use   Smoking status: Former Smoker    Types: Cigarettes    Quit date: 07/18/2018    Years since quitting: 1.3   Smokeless tobacco: Never Used  Substance and Sexual Activity   Alcohol use: Yes    Comment: rarely   Drug use: Never   Sexual activity: Yes    Birth control/protection: Surgical  Other Topics Concern   Not on file  Social History Narrative   Not on file   Social Determinants of Health   Financial Resource Strain: Low Risk    Difficulty of Paying Living Expenses: Not hard at all  Food Insecurity: No Food Insecurity   Worried About Charity fundraiser in the Last Year: Never true   [place] in the Last Year: Never true  Transportation Needs: No Transportation Needs   Lack of Transportation (Medical): No   Lack of Transportation (Non-Medical): No  Physical Activity: Inactive   Days of Exercise per Week: 0 days   Minutes of Exercise per Session: 0 min  Stress: Stress Concern  Present   Feeling of Stress : Very much  Social Connections: Unknown   Frequency of Communication with Friends and Family: Not on file   Frequency of Social Gatherings with Friends and Family: Not on file   Attends Religious Services: Never   Marine scientist or Organizations: No   Attends Music therapist: Never   Marital Status: Married  Human resources officer Violence: Not At Risk   Fear of Current or Ex-Partner: No   Emotionally Abused: No   Physically Abused: No   Sexually Abused: No      Review of Systems  Constitutional: Negative for chills, fatigue and  unexpected weight change.  HENT: Negative for congestion, rhinorrhea, sneezing and sore throat.   Eyes: Negative for photophobia, pain and redness.  Respiratory: Negative for cough, chest tightness and shortness of breath.   Cardiovascular: Negative for chest pain and palpitations.  Gastrointestinal: Negative for abdominal pain, constipation, diarrhea, nausea and vomiting.  Endocrine: Negative.   Genitourinary: Negative for dysuria and frequency.  Musculoskeletal: Negative for arthralgias, back pain, joint swelling and neck pain.  Skin: Negative for rash.  Allergic/Immunologic: Negative.   Neurological: Negative for tremors and numbness.  Hematological: Negative for adenopathy. Does not bruise/bleed easily.  Psychiatric/Behavioral: Negative for behavioral problems and sleep disturbance. The patient is not nervous/anxious.     Vital Signs: BP 140/88    Pulse 80    Temp (!) 97.3 F (36.3 C)    Resp 16    Ht 5\' 7"  (1.702 m)    Wt 298 lb 12.8 oz (135.5 kg)    SpO2 99%    BMI 46.80 kg/m    Physical Exam Vitals and nursing note reviewed.  Constitutional:      General: She is not in acute distress.    Appearance: She is well-developed. She is not diaphoretic.  HENT:     Head: Normocephalic and atraumatic.     Mouth/Throat:     Pharynx: No oropharyngeal exudate.  Eyes:     Pupils: Pupils are equal, round, and reactive to light.  Neck:     Thyroid: No thyromegaly.     Vascular: No JVD.     Trachea: No tracheal deviation.  Cardiovascular:     Rate and Rhythm: Normal rate and regular rhythm.     Heart sounds: Normal heart sounds. No murmur. No friction rub. No gallop.   Pulmonary:     Effort: Pulmonary effort is normal. No respiratory distress.     Breath sounds: Normal breath sounds. No wheezing or rales.  Chest:     Chest wall: No tenderness.  Abdominal:     Palpations: Abdomen is soft.     Tenderness: There is no abdominal tenderness. There is no guarding.  Musculoskeletal:         General: Normal range of motion.     Cervical back: Normal range of motion and neck supple.  Lymphadenopathy:     Cervical: No cervical adenopathy.  Skin:    General: Skin is warm and dry.          Comments: Recent I/D of pilonidal cyst. Well healing, no redness, closed wound.  Neurological:     Mental Status: She is alert and oriented to person, place, and time.     Cranial Nerves: No cranial nerve deficit.  Psychiatric:        Behavior: Behavior normal.        Thought Content: Thought content normal.  Judgment: Judgment normal.     Assessment/Plan: 1. Pilonidal abscess I/D by general surgery in December. Has been changing dressings at home with packing, recently wound stopped requiring packing as it was healing. Site appears to be healing well at this time, no redness, well approximated edges, no drainage or pain. Continue to monitor, no need for further follow-up at this time.  2. Morbid obesity ([place]) Has been seeing dietician for weight loss. Plans to have gastric weight loss surgery this month after her last appointment with her dietician in a few weeks. Will plan to follow up with her after weight loss surgery.  General Counseling: Alesha verbalizes understanding of the findings of todays visit and agrees with plan of treatment. I have discussed any further diagnostic evaluation that may be needed or ordered today. We also reviewed her medications today. she has been encouraged to call the office with any questions or concerns that should arise related to todays visit.    No orders of the defined types were placed in this encounter.   No orders of the defined types were placed in this encounter.   Time spent: 25 Minutes   This patient was seen by Orson Gear AGNP-C in Collaboration with Dr Lavera Guise as a part of collaborative care agreement     Kendell Bane AGNP-C Internal medicine

## 2019-11-27 ENCOUNTER — Ambulatory Visit (INDEPENDENT_AMBULATORY_CARE_PROVIDER_SITE_OTHER): Admitting: Psychiatry

## 2019-11-27 ENCOUNTER — Other Ambulatory Visit: Payer: Self-pay

## 2019-11-27 ENCOUNTER — Encounter: Payer: Self-pay | Admitting: Psychiatry

## 2019-11-27 DIAGNOSIS — F411 Generalized anxiety disorder: Secondary | ICD-10-CM | POA: Diagnosis not present

## 2019-11-27 DIAGNOSIS — F3177 Bipolar disorder, in partial remission, most recent episode mixed: Secondary | ICD-10-CM | POA: Diagnosis not present

## 2019-11-27 DIAGNOSIS — F40298 Other specified phobia: Secondary | ICD-10-CM | POA: Diagnosis not present

## 2019-11-27 DIAGNOSIS — F5101 Primary insomnia: Secondary | ICD-10-CM

## 2019-11-27 MED ORDER — TRAZODONE HCL 100 MG PO TABS: 100.0000 mg | ORAL_TABLET | Freq: Every day | ORAL | 0 refills | Status: DC

## 2019-11-27 MED ORDER — HYDROXYZINE HCL 25 MG PO TABS: 12.5000 mg | ORAL_TABLET | Freq: Three times a day (TID) | ORAL | 1 refills | Status: DC | PRN

## 2019-11-27 MED ORDER — DIVALPROEX SODIUM ER 500 MG PO TB24: 1500.0000 mg | ORAL_TABLET | Freq: Every day | ORAL | 0 refills | Status: DC

## 2019-11-27 NOTE — Progress Notes (Signed)
Virtual Visit via Video Note  I connected with Gloria Garza on 11/27/19 at 10:30 AM EST by a video enabled telemedicine application and verified that I am speaking with the correct person using two identifiers.   I discussed the limitations of evaluation and management by telemedicine and the availability of in person appointments. The patient expressed understanding and agreed to proceed.     I discussed the assessment and treatment plan with the patient. The patient was provided an opportunity to ask questions and all were answered. The patient agreed with the plan and demonstrated an understanding of the instructions.   The patient was advised to call back or seek an in-person evaluation if the symptoms worsen or if the condition fails to improve as anticipated.   Lafitte MD OP Progress Note  11/27/2019 1:58 PM Gloria Garza  MRN:  [id number]  Chief Complaint:  Chief Complaint    Follow-up     HPI: Mirha is a 38 year old Caucasian female, has a history of bipolar disorder, GAD, lives in [place], married was evaluated by telemedicine today.  Patient reports she is currently struggling with on and off irritability.  She reports she also struggles with impulsivity on and off.  This has been getting worse since the past few months.  She reports she recently had hysterectomy.  And now she is scheduled for another surgery-gastric sleeve.  She does not know if all her psychosocial stressors are too overwhelming for her and is making her more irritable.  Patient reports sleep is good.  She is compliant on her medications as prescribed.  She denies any suicidality or homicidality or perceptual disturbances.  She has been noncompliant with her psychotherapy sessions.  Discussed with patient about starting her on a scheduled mood stabilizer to augment the Depakote or an as needed medication like hydroxyzine.  Also discussed restarting psychotherapy sessions again.  Patient is currently interested  in an as needed medication and will let writer know if she needs more help. Visit Diagnosis:    ICD-10-CM   1. Bipolar disorder, in partial remission, most recent episode mixed (HCC)  F31.77 divalproex (DEPAKOTE ER) 500 MG 24 hr tablet    traZODone (DESYREL) 100 MG tablet    hydrOXYzine (ATARAX/VISTARIL) 25 MG tablet  2. Primary insomnia  F51.01   3. GAD (generalized anxiety disorder)  F41.1   4. 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  Past trials of Zoloft  Past Medical History:  Past Medical History:  Diagnosis Date  . Anxiety   . Bipolar disorder ([place])   . Depression     Past Surgical History:  Procedure Laterality Date  . CESAREAN SECTION    . CYSTOSCOPY  09/26/2019   Procedure: CYSTOSCOPY;  Surgeon: Gae Dry, MD;  Location: [facility] ORS;  Service: Gynecology;;  . LAPAROSCOPIC HYSTERECTOMY N/A 09/26/2019   Procedure: HYSTERECTOMY TOTAL LAPAROSCOPIC;  Surgeon: Gae Dry, MD;  Location: [facility] ORS;  Service: Gynecology;  Laterality: N/A;  . TUBAL LIGATION      Family Psychiatric History: I have reviewed family psychiatric history from my progress note on 12/18/2018.  Family History:  Family History  Problem Relation Age of Onset  . Diabetes Maternal Grandmother   . Cancer - Colon Maternal Grandmother   . Diabetes Paternal Grandmother   . Heart disease Paternal Grandmother   . Alcohol abuse Brother   . Drug abuse Brother     Social  History: I have reviewed social history from my progress note on 12/18/2018. Social History   Socioeconomic History  . Marital status: Married    Spouse name: terah  . Number of children: 2  . Years of education: Not on file  . Highest education level: Associate degree: occupational, Hotel manager, or vocational program  Occupational History  . Not on file  Tobacco Use  . Smoking status: Former Smoker    Types: Cigarettes    Quit date: 07/18/2018     Years since quitting: 1.3  . Smokeless tobacco: Never Used  Substance and Sexual Activity  . Alcohol use: Yes    Comment: rarely  . Drug use: Never  . Sexual activity: Yes    Birth control/protection: Surgical  Other Topics Concern  . Not on file  Social History Narrative  . Not on file   Social Determinants of Health   Financial Resource Strain: Low Risk   . Difficulty of Paying Living Expenses: Not hard at all  Food Insecurity: No Food Insecurity  . Worried About Charity fundraiser in the Last Year: Never true  . Ran Out of Food in the Last Year: Never true  Transportation Needs: No Transportation Needs  . Lack of Transportation (Medical): No  . Lack of Transportation (Non-Medical): No  Physical Activity: Inactive  . Days of Exercise per Week: 0 days  . Minutes of Exercise per Session: 0 min  Stress: Stress Concern Present  . Feeling of Stress : Very much  Social Connections: Unknown  . Frequency of Communication with Friends and Family: Not on file  . Frequency of Social Gatherings with Friends and Family: Not on file  . Attends Religious Services: Never  . Active Member of Clubs or Organizations: No  . Attends Archivist Meetings: Never  . Marital Status: Married    Allergies: No Known Allergies  Metabolic Disorder Labs: Lab Results  Component Value Date   HGBA1C 6.0 (H) 12/31/2018   Lab Results  Component Value Date   PROLACTIN 14.9 12/31/2018   Lab Results  Component Value Date   CHOL 200 (H) 12/31/2018   TRIG 262 (H) 12/31/2018   HDL 48 12/31/2018   LDLCALC 100 (H) 12/31/2018   Lab Results  Component Value Date   TSH 1.340 10/29/2018    Therapeutic Level Labs: No results found for: LITHIUM Lab Results  Component Value Date   VALPROATE 44 (L) 06/05/2019   VALPROATE 49 (L) 05/22/2019   No components found for:  CBMZ  Current Medications: Current Outpatient Medications  Medication Sig Dispense Refill  . divalproex (DEPAKOTE  ER) 500 MG 24 hr tablet Take 3 tablets (1,500 mg total) by mouth daily. 270 tablet 0  . hydrOXYzine (ATARAX/VISTARIL) 25 MG tablet Take 0.5-1 tablets (12.5-25 mg total) by mouth 3 (three) times daily as needed. Severe anxiety and irritability 90 tablet 1  . Multiple Vitamin (MULTI-VITAMIN DAILY PO) Take by mouth.    . traZODone (DESYREL) 100 MG tablet Take 1-2 tablets (100-200 mg total) by mouth at bedtime. sleep 180 tablet 0   No current facility-administered medications for this visit.     Musculoskeletal: Strength & Muscle Tone: UTA Gait & Station: normal Patient leans: N/A  Psychiatric Specialty Exam: Review of Systems  Psychiatric/Behavioral:       Irritability on and off  All other systems reviewed and are negative.   There were no vitals taken for this visit.There is no height or weight on file to calculate BMI.  General Appearance: Casual  Eye Contact:  Fair  Speech:  Clear and Coherent  Volume:  Normal  Mood:  Irritable  Affect:  Congruent  Thought Process:  Goal Directed and Descriptions of Associations: Intact  Orientation:  Full (Time, Place, and Person)  Thought Content: Logical   Suicidal Thoughts:  No  Homicidal Thoughts:  No  Memory:  Immediate;   Fair Recent;   Fair Remote;   Fair  Judgement:  Fair  Insight:  Fair  Psychomotor Activity:  Normal  Concentration:  Concentration: Fair and Attention Span: Fair  Recall:  [employer] of Knowledge: Fair  Language: Fair  Akathisia:  No  Handed:  Right  AIMS (if indicated): Denies tremors, rigidity  Assets:  Communication Skills Desire for Improvement Housing Intimacy Social Support  ADL's:  Intact  Cognition: WNL  Sleep:  Fair   Screenings: PHQ2-9     Office Visit from 11/25/2019 in [facility], [facility] Office Visit from 07/17/2019 in [facility], [facility] Nutrition from 06/11/2019 in [place] Office Visit from 01/16/2019 in [facility], [facility] Office Visit  from 12/20/2018 in [facility], [facility]  PHQ-2 Total Score  1  0  0  0  1       Assessment and Plan: Jadda is a 39 year old Caucasian female who has a history of bipolar disorder currently in partial remission, anxiety disorder was evaluated by telemedicine today.  She is biologically predisposed given her history of trauma.  She also has psychosocial stressors of several deaths in the family, current pandemic as well as her own health issues.  Patient is currently struggling with irritability on and off.  Discussed plan as noted below.  Plan Bipolar disorder in partial remission Depakote ER 1500 mg p.o. daily Depakote level on 06/05/2019 -44-subtherapeutic Start hydroxyzine 12.5 to 25 mg p.o. 3 times daily as needed for severe anxiety and irritability. Discussed with patient to restart psychotherapy sessions. Also advised patient to call the clinic back if she decides to start a scheduled mood stabilizer to augment her Depakote.  Insomnia-stable Trazodone 100 to 200 mg p.o. nightly.  GAD-stable Continue psychotherapy sessions as needed. Hydroxyzine as summarized above  Specific phobia-making progress-flying Continue CBT  Follow-up in clinic in 1 month or sooner if needed.  February 8 at 2:20 PM  I have spent atleast 20 minutes non face to face with patient today. More than 50 % of the time was spent for  ordering medications and test ,psychoeducation and supportive psychotherapy and care coordination,as well as documenting clinical information in electronic health record. This note was generated in part or whole with voice recognition software. Voice recognition is usually quite accurate but there are transcription errors that can and very often do occur. I apologize for any typographical errors that were not detected and corrected.       Ursula Alert, MD 11/27/2019, 1:58 PM

## 2019-11-28 ENCOUNTER — Encounter: Payer: Self-pay | Admitting: Surgery

## 2019-11-28 ENCOUNTER — Ambulatory Visit (INDEPENDENT_AMBULATORY_CARE_PROVIDER_SITE_OTHER): Admitting: Surgery

## 2019-11-28 VITALS — BP 118/79 | HR 86 | Temp 97.9°F | Ht 67.0 in | Wt 289.0 lb

## 2019-11-28 DIAGNOSIS — L0501 Pilonidal cyst with abscess: Secondary | ICD-10-CM

## 2019-11-28 NOTE — Patient Instructions (Signed)
   Follow-up with our office as needed.  Please call and ask to speak with a nurse if you develop questions or concerns.  

## 2019-11-28 NOTE — Progress Notes (Signed)
[facility] POST-OP OFFICE VISIT  11/28/2019  HPI: Gloria Garza is a 39 y.o. female  s/p I&D of pilonidal abscess.  She reports she stopped packing it days ago, denies any drainage, tenderness or pain.  Vital signs: BP 118/79   Pulse 86   Temp 97.9 F (36.6 C)   Ht 5\' 7"  (1.702 m)   Wt 289 lb (131.1 kg)   LMP 08/30/2019   SpO2 98%   BMI 45.26 kg/m    Physical Exam: Constitutional: Appears well  Skin: To the right of midline there is a well-healed incision site, with epidermal lysed groove.  There is a singular midline pit which is uninflamed, there is no evidence of any natal cleft tenderness induration or evidence of further pilonidal disease.  Assessment/Plan: This is a 39 y.o. female  s/p incision and drainage pilonidal abscess  Patient Active Problem List   Diagnosis Date Noted  . Bipolar disorder, in partial remission, most recent episode mixed ([place]) 11/27/2019  . Pilonidal abscess 10/21/2019  . S/P laparoscopic hysterectomy 10/04/2019  . Bipolar 1 disorder, mixed, moderate ([place]) 05/22/2019  . GAD (generalized anxiety disorder) 05/22/2019  . Primary insomnia 05/22/2019  . Tobacco use disorder 05/22/2019  . Specific phobia 05/22/2019    -We discussed risks of recurrence.  She is hopeful this will not recur as this is her first intervention for her longstanding disease process.  However her disease is quite limited and I think her hopes are reasonable.  We will see her back as needed.   Ronny Bacon M.D., FACS 11/28/2019, 10:06 AM

## 2019-12-03 ENCOUNTER — Ambulatory Visit: Admitting: Psychology

## 2019-12-06 ENCOUNTER — Ambulatory Visit: Admitting: Dietician

## 2019-12-06 ENCOUNTER — Ambulatory Visit

## 2019-12-12 ENCOUNTER — Telehealth: Payer: Self-pay | Admitting: Dietician

## 2019-12-12 NOTE — Telephone Encounter (Signed)
Called patient to reschedule her missed appointment from 12/06/19 (which was scheduled to follow pre-op class, cancelled due to limited surgeries at this time). Left message for her to return the call and reschedule before 12/22/19 in order to avoid having to restart the 86-month supervised weight loss visits.

## 2019-12-12 NOTE — Telephone Encounter (Signed)
Patient returned message; rescheduled to 12/17/19 at 11:30am.

## 2019-12-16 ENCOUNTER — Ambulatory Visit: Admitting: Dietician

## 2019-12-17 ENCOUNTER — Ambulatory Visit: Admitting: Psychology

## 2019-12-17 ENCOUNTER — Encounter: Attending: General Surgery | Admitting: Dietician

## 2019-12-17 ENCOUNTER — Other Ambulatory Visit: Payer: Self-pay

## 2019-12-17 ENCOUNTER — Encounter: Payer: Self-pay | Admitting: Dietician

## 2019-12-17 DIAGNOSIS — R7303 Prediabetes: Secondary | ICD-10-CM | POA: Diagnosis not present

## 2019-12-17 DIAGNOSIS — Z8249 Family history of ischemic heart disease and other diseases of the circulatory system: Secondary | ICD-10-CM | POA: Diagnosis not present

## 2019-12-17 DIAGNOSIS — F319 Bipolar disorder, unspecified: Secondary | ICD-10-CM | POA: Diagnosis not present

## 2019-12-17 DIAGNOSIS — Z6841 Body Mass Index (BMI) 40.0 and over, adult: Secondary | ICD-10-CM | POA: Diagnosis not present

## 2019-12-17 DIAGNOSIS — Z713 Dietary counseling and surveillance: Secondary | ICD-10-CM | POA: Insufficient documentation

## 2019-12-17 DIAGNOSIS — Z833 Family history of diabetes mellitus: Secondary | ICD-10-CM | POA: Diagnosis not present

## 2019-12-17 NOTE — Patient Instructions (Signed)
   Continue with current eating pattern, great job staying focused and maintaining healthy habits!  Keep up some form of exercise as regularly as possible, engage in activities your kids will enjoy too.

## 2019-12-17 NOTE — Progress Notes (Signed)
Appt start time: 1110 end time:  1130.  Assessment:   #6 SWL Appointment.   Start Wt at [place]: 281.1lbs Wt: 291.5lbs Ht: 5'7" BMI: 45.66   Learning Readiness:   Change in progress  MEDICATIONS: divalproex, hydrOXYzine, traZODone, multivitamin  Progress:  Weight gain of 2.6lbs since previous visit on 11/13/19. Patient feels weight gain is likely due to starting new medication  She reports continuing with healthy eating habits.   Dietary intake:  Breakfast: boiled eggs with avocado or bacon; oatmeal squares cereal  Snack: fruit  Lunch: salad with chicken or [place] Snack: raw veg with cheese; canned fruit (no added sugar) Dinner: grilled meat + veg brussels sprouts, broccoli, etc Snack: none or fruit Beverages: water, no sodas, rarely orange juice (weekends)  Usual physical activity: bicycling, walking 2x a week 60 minutes  Diet to Follow: Continue with current eating pattern, limiting starchy foods and high sugar/ high-fat foods               Nutritional Diagnosis:  [place]-3.3 Overweight/obesity related to history of excess calories and physical inactivity as evidenced by patient current BMI of 45.66, following dietary guidelines for weight loss prior to bariatric surgery.              Intervention:   . Nutrition counseling for weight loss prior to upcoming bariatric surgery. . Reviewed progress since previous visit. . Patient voices goal of working with physical activity/ exercise as much as possible to help promote weight loss. Challenges are inability to go to gym, child care.   Teaching Method Utilized:  Visual Auditory Hands on   Barriers to learning/adherence to lifestyle change: none  Demonstrated degree of understanding via:  Teach Back   Monitoring/Evaluation:  Dietary intake, exercise, and body weight.          Pre-op class date to be determined.

## 2019-12-30 ENCOUNTER — Other Ambulatory Visit: Payer: Self-pay

## 2019-12-30 ENCOUNTER — Encounter: Payer: Self-pay | Admitting: Psychiatry

## 2019-12-30 ENCOUNTER — Ambulatory Visit (INDEPENDENT_AMBULATORY_CARE_PROVIDER_SITE_OTHER): Admitting: Psychiatry

## 2019-12-30 DIAGNOSIS — F3178 Bipolar disorder, in full remission, most recent episode mixed: Secondary | ICD-10-CM

## 2019-12-30 DIAGNOSIS — F411 Generalized anxiety disorder: Secondary | ICD-10-CM

## 2019-12-30 DIAGNOSIS — F40298 Other specified phobia: Secondary | ICD-10-CM

## 2019-12-30 DIAGNOSIS — F5101 Primary insomnia: Secondary | ICD-10-CM

## 2019-12-30 NOTE — Progress Notes (Signed)
Provider Location : [facility] Patient Location : Home   Virtual Visit via Video Note  I connected with Gloria Garza on 12/30/19 at  2:20 PM EST by a video enabled telemedicine application and verified that I am speaking with the correct person using two identifiers.   I discussed the limitations of evaluation and management by telemedicine and the availability of in person appointments. The patient expressed understanding and agreed to proceed.   I discussed the assessment and treatment plan with the patient. The patient was provided an opportunity to ask questions and all were answered. The patient agreed with the plan and demonstrated an understanding of the instructions.   The patient was advised to call back or seek an in-person evaluation if the symptoms worsen or if the condition fails to improve as anticipated.   [place] MD OP Progress Note  12/30/2019 2:39 PM Gloria Garza  MRN:  [id number]  Chief Complaint:  Chief Complaint    Follow-up     HPI: Gloria Garza is a 39 year old Caucasian female who has a history of bipolar disorder, GAD, lives in [place], married was evaluated by telemedicine today.  Patient today reports she is currently doing well with regards to her mood symptoms.  She reports there are times when she gets irritable however she has been using hydroxyzine which helps her to calm down.  She has needed it only once or twice in the past several months.  She reports the hydroxyzine does make her drowsy however she does not use it much and is okay with it at this time.  She does not want any medication changes.  She reports sleep is good.  Patient denies any suicidality, homicidality or perceptual disturbances.  She reports she was unable to get the gastric sleeve surgery done due to COVID-19.  She reports they are going to start scheduling it again at some point and she is awaiting that.  She denies any other concerns today. Visit Diagnosis:    ICD-10-CM   1. Bipolar disorder,  in full remission, most recent episode mixed ([place])  F31.78   2. Primary insomnia  F51.01   3. GAD (generalized anxiety disorder)  F41.1   4. 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  Past trials of Zoloft.  Past Medical History:  Past Medical History:  Diagnosis Date  . Anxiety   . Bipolar disorder ([place])   . Depression     Past Surgical History:  Procedure Laterality Date  . CESAREAN SECTION    . CYSTOSCOPY  09/26/2019   Procedure: CYSTOSCOPY;  Surgeon: Gae Dry, MD;  Location: [facility] ORS;  Service: Gynecology;;  . LAPAROSCOPIC HYSTERECTOMY N/A 09/26/2019   Procedure: HYSTERECTOMY TOTAL LAPAROSCOPIC;  Surgeon: Gae Dry, MD;  Location: [facility] ORS;  Service: Gynecology;  Laterality: N/A;  . TUBAL LIGATION      Family Psychiatric History: I have reviewed family psychiatric history from my progress note on 12/18/2018.  Family History:  Family History  Problem Relation Age of Onset  . Diabetes Maternal Grandmother   . Cancer - Colon Maternal Grandmother   . Diabetes Paternal Grandmother   . Heart disease Paternal Grandmother   . Alcohol abuse Brother   . Drug abuse Brother     Social History: I have reviewed social history from my progress note on 12/18/2018. Social History   Socioeconomic History  . Marital status: Married    Spouse  name: terah  . Number of children: 2  . Years of education: Not on file  . Highest education level: Associate degree: occupational, Hotel manager, or vocational program  Occupational History  . Not on file  Tobacco Use  . Smoking status: Former Smoker    Types: Cigarettes    Quit date: 07/18/2018    Years since quitting: 1.4  . Smokeless tobacco: Never Used  Substance and Sexual Activity  . Alcohol use: Yes    Comment: rarely  . Drug use: Never  . Sexual activity: Yes    Birth control/protection: Surgical  Other Topics Concern  . Not on  file  Social History Narrative  . Not on file   Social Determinants of Health   Financial Resource Strain:   . Difficulty of Paying Living Expenses: Not on file  Food Insecurity:   . Worried About Charity fundraiser in the Last Year: Not on file  . Ran Out of Food in the Last Year: Not on file  Transportation Needs:   . Lack of Transportation (Medical): Not on file  . Lack of Transportation (Non-Medical): Not on file  Physical Activity:   . Days of Exercise per Week: Not on file  . Minutes of Exercise per Session: Not on file  Stress:   . Feeling of Stress : Not on file  Social Connections:   . Frequency of Communication with Friends and Family: Not on file  . Frequency of Social Gatherings with Friends and Family: Not on file  . Attends Religious Services: Not on file  . Active Member of Clubs or Organizations: Not on file  . Attends Archivist Meetings: Not on file  . Marital Status: Not on file    Allergies: No Known Allergies  Metabolic Disorder Labs: Lab Results  Component Value Date   HGBA1C 6.0 (H) 12/31/2018   Lab Results  Component Value Date   PROLACTIN 14.9 12/31/2018   Lab Results  Component Value Date   CHOL 200 (H) 12/31/2018   TRIG 262 (H) 12/31/2018   HDL 48 12/31/2018   LDLCALC 100 (H) 12/31/2018   Lab Results  Component Value Date   TSH 1.340 10/29/2018    Therapeutic Level Labs: No results found for: LITHIUM Lab Results  Component Value Date   VALPROATE 44 (L) 06/05/2019   VALPROATE 49 (L) 05/22/2019   No components found for:  CBMZ  Current Medications: Current Outpatient Medications  Medication Sig Dispense Refill  . divalproex (DEPAKOTE ER) 500 MG 24 hr tablet Take 3 tablets (1,500 mg total) by mouth daily. 270 tablet 0  . hydrOXYzine (ATARAX/VISTARIL) 25 MG tablet Take 0.5-1 tablets (12.5-25 mg total) by mouth 3 (three) times daily as needed. Severe anxiety and irritability 90 tablet 1  . Multiple Vitamin  (MULTI-VITAMIN DAILY PO) Take by mouth.    . traZODone (DESYREL) 100 MG tablet Take 1-2 tablets (100-200 mg total) by mouth at bedtime. sleep 180 tablet 0   No current facility-administered medications for this visit.     Musculoskeletal: Strength & Muscle Tone: UTA Gait & Station: normal Patient leans: N/A  Psychiatric Specialty Exam: Review of Systems  Psychiatric/Behavioral: Negative for agitation, behavioral problems, confusion, decreased concentration, dysphoric mood, hallucinations, self-injury, sleep disturbance and suicidal ideas. The patient is not nervous/anxious and is not hyperactive.   All other systems reviewed and are negative.   Last menstrual period 08/30/2019.There is no height or weight on file to calculate BMI.  General Appearance: Casual  Eye  Contact:  Fair  Speech:  Clear and Coherent  Volume:  Normal  Mood:  Euthymic  Affect:  Appropriate  Thought Process:  Goal Directed and Descriptions of Associations: Intact  Orientation:  Full (Time, Place, and Person)  Thought Content: Logical   Suicidal Thoughts:  No  Homicidal Thoughts:  No  Memory:  Immediate;   Fair Recent;   Fair Remote;   Fair  Judgement:  Fair  Insight:  Fair  Psychomotor Activity:  Normal  Concentration:  Concentration: Fair and Attention Span: Fair  Recall:  [employer] of Knowledge: Fair  Language: Fair  Akathisia:  No  Handed:  Right  AIMS (if indicated): Denies tremors, rigidity  Assets:  Communication Skills Desire for Improvement Housing Social Support  ADL's:  Intact  Cognition: WNL  Sleep:  Fair   Screenings: PHQ2-9     Office Visit from 11/25/2019 in [facility], [place] from 07/17/2019 in [facility] Nutrition from 06/11/2019 in [place] Office Visit from 01/16/2019 in [facility], [facility] Office Visit from 12/20/2018 in [facility], [facility]  PHQ-2 Total Score  1  0  0  0  1        Assessment and Plan: Jesicca is a 39 year old Caucasian female who has a history of bipolar disorder in remission, anxiety disorder was evaluated by telemedicine today.  She is biologically predisposed given her history of trauma.  She also has psychosocial stressors of several deaths in the family, current pandemic as well as her own health issues.  Patient is currently stable on current medication regimen.  Plan as noted below.  Plan Bipolar disorder in remission Depakote ER 1500 mg p.o. daily Depakote level on 06/05/2019-44-subtherapeutic Hydroxyzine 12.5 to 25 mg p.o. 3 times daily as needed for severe anxiety irritability. Patient was advised to restart psychotherapy sessions in the past-however has been noncompliant.  Insomnia-stable Trazodone 100 to 200 mg p.o. nightly  GAD-stable Hydroxyzine as needed  Specific phobia-making progress-flying Continue CBT  Follow-up in clinic in 2 months or sooner if needed.  April 22 at 2:20 PM  I have spent atleast 20 minutes non face to face with patient today. More than 50 % of the time was spent for  ordering medications and test ,psychoeducation and supportive psychotherapy and care coordination,as well as documenting clinical information in electronic health record. This note was generated in part or whole with voice recognition software. Voice recognition is usually quite accurate but there are transcription errors that can and very often do occur. I apologize for any typographical errors that were not detected and corrected.       Ursula Alert, MD 12/30/2019, 2:39 PM

## 2020-01-28 ENCOUNTER — Ambulatory Visit (INDEPENDENT_AMBULATORY_CARE_PROVIDER_SITE_OTHER): Admitting: Psychology

## 2020-01-28 DIAGNOSIS — F509 Eating disorder, unspecified: Secondary | ICD-10-CM | POA: Diagnosis not present

## 2020-02-11 ENCOUNTER — Ambulatory Visit (INDEPENDENT_AMBULATORY_CARE_PROVIDER_SITE_OTHER): Admitting: Psychology

## 2020-02-11 ENCOUNTER — Ambulatory Visit: Admitting: Psychology

## 2020-02-11 DIAGNOSIS — F509 Eating disorder, unspecified: Secondary | ICD-10-CM | POA: Diagnosis not present

## 2020-02-12 ENCOUNTER — Telehealth (HOSPITAL_COMMUNITY): Payer: Self-pay

## 2020-02-12 NOTE — Telephone Encounter (Signed)
Patient called stating that she's interested in having bariatric surgery but would need a letter from doctor approving the surgery. Please advise. Thank you.

## 2020-02-13 ENCOUNTER — Encounter: Payer: Self-pay | Admitting: Psychiatry

## 2020-02-13 NOTE — Telephone Encounter (Signed)
Spoke with patient and relayed message that we would do a letter.

## 2020-02-13 NOTE — Telephone Encounter (Signed)
Please let her know I can give a letter stating that she is under my care and the current status of her condition.

## 2020-02-14 ENCOUNTER — Telehealth: Payer: Self-pay | Admitting: Psychiatry

## 2020-02-14 ENCOUNTER — Encounter: Payer: Self-pay | Admitting: Psychiatry

## 2020-02-14 NOTE — Telephone Encounter (Signed)
Completed letter ,will have CMA call patient to pick up.

## 2020-02-19 ENCOUNTER — Telehealth: Payer: Self-pay

## 2020-02-19 NOTE — Telephone Encounter (Signed)
Called lmom informing patient of appointment on 02/24/2020. klh

## 2020-02-20 ENCOUNTER — Telehealth: Payer: Self-pay

## 2020-02-20 NOTE — Telephone Encounter (Signed)
CONFIRMED AND SCREENED FOR 02-24-20 OV.

## 2020-02-22 ENCOUNTER — Other Ambulatory Visit: Payer: Self-pay | Admitting: Psychiatry

## 2020-02-22 DIAGNOSIS — F3177 Bipolar disorder, in partial remission, most recent episode mixed: Secondary | ICD-10-CM

## 2020-02-24 ENCOUNTER — Ambulatory Visit (INDEPENDENT_AMBULATORY_CARE_PROVIDER_SITE_OTHER): Admitting: Adult Health

## 2020-02-24 ENCOUNTER — Encounter: Payer: Self-pay | Admitting: Adult Health

## 2020-02-24 ENCOUNTER — Other Ambulatory Visit: Payer: Self-pay

## 2020-02-24 VITALS — BP 143/86 | HR 82 | Temp 97.5°F | Resp 16 | Ht 67.0 in | Wt 294.0 lb

## 2020-02-24 DIAGNOSIS — Z6841 Body Mass Index (BMI) 40.0 and over, adult: Secondary | ICD-10-CM

## 2020-02-24 DIAGNOSIS — F339 Major depressive disorder, recurrent, unspecified: Secondary | ICD-10-CM | POA: Diagnosis not present

## 2020-02-24 DIAGNOSIS — E781 Pure hyperglyceridemia: Secondary | ICD-10-CM

## 2020-02-24 DIAGNOSIS — F17219 Nicotine dependence, cigarettes, with unspecified nicotine-induced disorders: Secondary | ICD-10-CM | POA: Diagnosis not present

## 2020-02-24 NOTE — Progress Notes (Signed)
[facility] [place], [place] [address]  Internal MEDICINE  Office Visit Note  Patient Name: Gloria Garza  [id number]  [id number]  Date of Service: 02/24/2020  Chief Complaint  Patient presents with  . Follow-up  . Depression    HPI PT is here for follow up on depression and HLD.  She is a morbidly obese female. She is in the process of having bariatric surgery counseling.  She is approved and will take her class on the 18 of this month, and is hoping to have surgery with a few weeks of that.  She denies any new complaints at this time.      Current Medication: Outpatient Encounter Medications as of 02/24/2020  Medication Sig  . divalproex (DEPAKOTE ER) 500 MG 24 hr tablet TAKE 3 TABLETS(1500 MG) BY MOUTH DAILY  . hydrOXYzine (ATARAX/VISTARIL) 25 MG tablet Take 0.5-1 tablets (12.5-25 mg total) by mouth 3 (three) times daily as needed. Severe anxiety and irritability  . Multiple Vitamin (MULTI-VITAMIN DAILY PO) Take by mouth.  . traZODone (DESYREL) 100 MG tablet TAKE 1 TO 2 TABLETS(100 TO 200 MG) BY MOUTH AT BEDTIME FOR SLEEP   No facility-administered encounter medications on file as of 02/24/2020.    Surgical History: Past Surgical History:  Procedure Laterality Date  . CESAREAN SECTION    . CYSTOSCOPY  09/26/2019   Procedure: CYSTOSCOPY;  Surgeon: Gae Dry, MD;  Location: [facility] ORS;  Service: Gynecology;;  . LAPAROSCOPIC HYSTERECTOMY N/A 09/26/2019   Procedure: HYSTERECTOMY TOTAL LAPAROSCOPIC;  Surgeon: Gae Dry, MD;  Location: [facility] ORS;  Service: Gynecology;  Laterality: N/A;  . TUBAL LIGATION      Medical History: Past Medical History:  Diagnosis Date  . Anxiety   . Bipolar disorder ([place])   . Depression     Family History: Family History  Problem Relation Age of Onset  . Diabetes Maternal Grandmother   . Cancer - Colon Maternal Grandmother   . Diabetes Paternal Grandmother   . Heart disease Paternal Grandmother   . Alcohol  abuse Brother   . Drug abuse Brother     Social History   Socioeconomic History  . Marital status: Married    Spouse name: terah  . Number of children: 2  . Years of education: Not on file  . Highest education level: Associate degree: occupational, Hotel manager, or vocational program  Occupational History  . Not on file  Tobacco Use  . Smoking status: Former Smoker    Types: Cigarettes    Quit date: 07/18/2018    Years since quitting: 1.6  . Smokeless tobacco: Never Used  Substance and Sexual Activity  . Alcohol use: Yes    Comment: rarely  . Drug use: Never  . Sexual activity: Yes    Birth control/protection: Surgical  Other Topics Concern  . Not on file  Social History Narrative  . Not on file   Social Determinants of Health   Financial Resource Strain:   . Difficulty of Paying Living Expenses:   Food Insecurity:   . Worried About Charity fundraiser in the Last Year:   . Arboriculturist in the Last Year:   Transportation Needs:   . Film/video editor (Medical):   Marland Kitchen Lack of Transportation (Non-Medical):   Physical Activity:   . Days of Exercise per Week:   . Minutes of Exercise per Session:   Stress:   . Feeling of Stress :   Social Connections:   .  Frequency of Communication with Friends and Family:   . Frequency of Social Gatherings with Friends and Family:   . Attends Religious Services:   . Active Member of Clubs or Organizations:   . Attends Archivist Meetings:   Marland Kitchen Marital Status:   Intimate Partner Violence:   . Fear of Current or Ex-Partner:   . Emotionally Abused:   Marland Kitchen Physically Abused:   . Sexually Abused:       Review of Systems  Constitutional: Negative for chills, fatigue and unexpected weight change.  HENT: Negative for congestion, rhinorrhea, sneezing and sore throat.   Eyes: Negative for photophobia, pain and redness.  Respiratory: Negative for cough, chest tightness and shortness of breath.   Cardiovascular: Negative for  chest pain and palpitations.  Gastrointestinal: Negative for abdominal pain, constipation, diarrhea, nausea and vomiting.  Endocrine: Negative.   Genitourinary: Negative for dysuria and frequency.  Musculoskeletal: Negative for arthralgias, back pain, joint swelling and neck pain.  Skin: Negative for rash.  Allergic/Immunologic: Negative.   Neurological: Negative for tremors and numbness.  Hematological: Negative for adenopathy. Does not bruise/bleed easily.  Psychiatric/Behavioral: Negative for behavioral problems and sleep disturbance. The patient is not nervous/anxious.     Vital Signs: BP (!) 143/86   Pulse 82   Temp (!) 97.5 F (36.4 C)   Resp 16   Ht 5\' 7"  (1.702 m)   Wt 294 lb (133.4 kg)   LMP 08/30/2019   SpO2 100%   BMI 46.05 kg/m    Physical Exam Vitals and nursing note reviewed.  Constitutional:      General: She is not in acute distress.    Appearance: She is well-developed. She is not diaphoretic.  HENT:     Head: Normocephalic and atraumatic.     Mouth/Throat:     Pharynx: No oropharyngeal exudate.  Eyes:     Pupils: Pupils are equal, round, and reactive to light.  Neck:     Thyroid: No thyromegaly.     Vascular: No JVD.     Trachea: No tracheal deviation.  Cardiovascular:     Rate and Rhythm: Normal rate and regular rhythm.     Heart sounds: Normal heart sounds. No murmur. No friction rub. No gallop.   Pulmonary:     Effort: Pulmonary effort is normal. No respiratory distress.     Breath sounds: Normal breath sounds. No wheezing or rales.  Chest:     Chest wall: No tenderness.  Abdominal:     Palpations: Abdomen is soft.     Tenderness: There is no abdominal tenderness. There is no guarding.  Musculoskeletal:        General: Normal range of motion.     Cervical back: Normal range of motion and neck supple.  Lymphadenopathy:     Cervical: No cervical adenopathy.  Skin:    General: Skin is warm and dry.  Neurological:     Mental Status: She is  alert and oriented to person, place, and time.     Cranial Nerves: No cranial nerve deficit.  Psychiatric:        Behavior: Behavior normal.        Thought Content: Thought content normal.        Judgment: Judgment normal.    Assessment/Plan: 1. Depression, recurrent ([place]) Stable, continue to see psych as before.   2. Hypertriglyceridemia Pt will have lipid panel drawn at next appt, post surgery.   3. Cigarette nicotine dependence with nicotine-induced disorder Smoking cessation counseling:  1. Pt acknowledges the risks of long term smoking, she will try to quite smoking. 2. Options for different medications including nicotine products, chewing gum, patch etc, Wellbutrin and Chantix is discussed 3. Goal and date of compete cessation is discussed 4. Total time spent in smoking cessation is 15 min.  4. Morbid obesity ([place]) Obesity Counseling: Risk Assessment: An assessment of behavioral risk factors was made today and includes lack of exercise sedentary lifestyle, lack of portion control and poor dietary habits.  Risk Modification Advice: She was counseled on portion control guidelines. Restricting daily caloric intake to 1800. The detrimental long term effects of obesity on her health and ongoing poor compliance was also discussed with the patient.  5. BMI 45.0-49.9, adult (HCC) BMI over 40.   General Counseling: Jamiria verbalizes understanding of the findings of todays visit and agrees with plan of treatment. I have discussed any further diagnostic evaluation that may be needed or ordered today. We also reviewed her medications today. she has been encouraged to call the office with any questions or concerns that should arise related to todays visit.    No orders of the defined types were placed in this encounter.   No orders of the defined types were placed in this encounter.   Time spent: 30 Minutes   This patient was seen by Orson Gear AGNP-C in Collaboration with Dr  Lavera Guise as a part of collaborative care agreement     Kendell Bane AGNP-C Internal medicine

## 2020-03-06 ENCOUNTER — Other Ambulatory Visit: Payer: Self-pay

## 2020-03-06 ENCOUNTER — Encounter: Attending: General Surgery | Admitting: Dietician

## 2020-03-06 VITALS — Ht 67.0 in | Wt 293.2 lb

## 2020-03-06 DIAGNOSIS — R7303 Prediabetes: Secondary | ICD-10-CM | POA: Insufficient documentation

## 2020-03-06 DIAGNOSIS — F319 Bipolar disorder, unspecified: Secondary | ICD-10-CM | POA: Diagnosis not present

## 2020-03-06 DIAGNOSIS — Z713 Dietary counseling and surveillance: Secondary | ICD-10-CM | POA: Insufficient documentation

## 2020-03-06 DIAGNOSIS — Z833 Family history of diabetes mellitus: Secondary | ICD-10-CM | POA: Insufficient documentation

## 2020-03-06 DIAGNOSIS — Z8249 Family history of ischemic heart disease and other diseases of the circulatory system: Secondary | ICD-10-CM | POA: Insufficient documentation

## 2020-03-06 DIAGNOSIS — Z6841 Body Mass Index (BMI) 40.0 and over, adult: Secondary | ICD-10-CM | POA: Insufficient documentation

## 2020-03-06 NOTE — Progress Notes (Signed)
Pre-Operative Nutrition Class:  Appt start time: 0900   End time:  1100.  Patient was seen on 03/06/20 for Pre-Operative Bariatric Surgery Education at Nutrition and Diabetes Education Services at [facility]   Surgery date: TBD Surgery type: Sleeve Gastrectomy Start weight at [facility]: 285.1lbs 06/11/19 Weight today: 293.2lbs  InBody  BODY COMP RESULTS    BMI (kg/m^2) 45.9  Fat Mass (lbs) 153.9  Dry Lean Mass (lbs) 37.5  Total Body Water (lbs) 101.9   Samples given per MNT protocol. Patient educated on appropriate usage: Celebrate Vitamins Multivitamin  Lot # [id number],  Exp: 01/2021; Lot #: [id number], Exp: 02/2021; 0146A,Exp: 09/2020; Lot# [id number], Exp: 09/2020  Celebrate Vitamins Calcium Citrate   Lot # [id number], Exp: 07/2020; Lot # [id number], Exp: 04/2020; Lot# [id number], Exp: 05/2020; Lot# [id number], Exp: 06/2020; Lot# [id number], Exp: 0006, Exp: 05/2020; Lot# [id number], Exp: 05/2020; Lot# [id number], Exp: 09/2020  Celebrate Vitamins Iron   Lot# [id number], Exp: 04/2021; Lot# [id number], Exp: 04/2021   Renee Pain Protein Powder   Lot # [id number], Exp: 04/2020; Lot# [id number], Exp: 04/2020; Lot# [id number], Exp: 04/2020  Premier Protein Shake   Lot# [id number], Exp: 08/29/20 or Lot# [id number], Exp: 07/19/20  The following the learning objectives were met by the patient during this course:  Identify Pre-Op Dietary Goals and will begin 2 weeks pre-operatively  Identify appropriate sources of fluids and proteins   State protein recommendations and appropriate sources pre and post-operatively  Identify Post-Operative Dietary Goals and will follow for 2 weeks post-operatively  Identify appropriate multivitamin and calcium sources  Describe the need for physical activity post-operatively and will follow MD recommendations  State when to call healthcare provider regarding medication questions or post-operative complications  Handouts given during class include:  Pre-Op Bariatric Surgery Diet Handout  Protein Shake Handout  Post-Op Bariatric Surgery  Nutrition Handout  BELT Program Information Flyer  Support Group Information Flyer  [facility] Bariatric Supplements Price List  Follow-Up Plan: Patient will follow-up at [place], at about 2 weeks post operatively for diet advancement per MD.

## 2020-03-12 ENCOUNTER — Telehealth (INDEPENDENT_AMBULATORY_CARE_PROVIDER_SITE_OTHER): Admitting: Psychiatry

## 2020-03-12 ENCOUNTER — Encounter: Payer: Self-pay | Admitting: Psychiatry

## 2020-03-12 ENCOUNTER — Other Ambulatory Visit: Payer: Self-pay

## 2020-03-12 DIAGNOSIS — F3178 Bipolar disorder, in full remission, most recent episode mixed: Secondary | ICD-10-CM | POA: Diagnosis not present

## 2020-03-12 DIAGNOSIS — F411 Generalized anxiety disorder: Secondary | ICD-10-CM | POA: Diagnosis not present

## 2020-03-12 DIAGNOSIS — F40298 Other specified phobia: Secondary | ICD-10-CM

## 2020-03-12 DIAGNOSIS — F5101 Primary insomnia: Secondary | ICD-10-CM

## 2020-03-12 NOTE — Progress Notes (Signed)
Provider Location : [facility] Patient Location : Home  Virtual Visit via Video Note  I connected with Gloria Garza on 03/12/20 at  2:20 PM EDT by a video enabled telemedicine application and verified that I am speaking with the correct person using two identifiers.   I discussed the limitations of evaluation and management by telemedicine and the availability of in person appointments. The patient expressed understanding and agreed to proceed.    I discussed the assessment and treatment plan with the patient. The patient was provided an opportunity to ask questions and all were answered. The patient agreed with the plan and demonstrated an understanding of the instructions.   The patient was advised to call back or seek an in-person evaluation if the symptoms worsen or if the condition fails to improve as anticipated.   [place] MD OP Progress Note  03/12/2020 6:08 PM Gloria Garza  MRN:  [id number]  Chief Complaint:  Chief Complaint    Follow-up     HPI: Gloria Garza is a 39 year old Caucasian female who has a history of bipolar disorder, GAD, lives in [place], MAD[facility] was evaluated by telemedicine today.  She reports she is currently stable on the current medications.  She denies any mood swings.  She denies any significant anxiety symptoms.  She reports sleep is good.  She reports she is currently following up with an dietitian.  She reports she is currently taking only fruits vegetables as well as bottled water and has made a lot of changes with her diet.  She reports she had her psychiatric clearance and is awaiting her bariatric surgery.  Patient denies any suicidality, homicidality or perceptual disturbances.   Visit Diagnosis:    ICD-10-CM   1. Bipolar disorder, in full remission, most recent episode mixed ([place])  F31.78   2. Primary insomnia  F51.01   3. GAD (generalized anxiety disorder)  F41.1   4. Specific phobia  F40.298     Past Psychiatric History: I have reviewed past psychiatric  history from my progress note on 12/18/2018.  Past trials of Zoloft.  Past Medical History:  Past Medical History:  Diagnosis Date  . Anxiety   . Bipolar disorder ([place])   . Depression     Past Surgical History:  Procedure Laterality Date  . CESAREAN SECTION    . CYSTOSCOPY  09/26/2019   Procedure: CYSTOSCOPY;  Surgeon: Gae Dry, MD;  Location: [facility] ORS;  Service: Gynecology;;  . LAPAROSCOPIC HYSTERECTOMY N/A 09/26/2019   Procedure: HYSTERECTOMY TOTAL LAPAROSCOPIC;  Surgeon: Gae Dry, MD;  Location: [facility] ORS;  Service: Gynecology;  Laterality: N/A;  . TUBAL LIGATION      Family Psychiatric History: I have reviewed family psychiatric history from my progress note on 12/18/2018.  Family History:  Family History  Problem Relation Age of Onset  . Diabetes Maternal Grandmother   . Cancer - Colon Maternal Grandmother   . Diabetes Paternal Grandmother   . Heart disease Paternal Grandmother   . Alcohol abuse Brother   . Drug abuse Brother     Social History: Reviewed social history from my progress note on 12/18/2018. Social History   Socioeconomic History  . Marital status: Married    Spouse name: terah  . Number of children: 2  . Years of education: Not on file  . Highest education level: Associate degree: occupational, Hotel manager, or vocational program  Occupational History  . Not on file  Tobacco Use  . Smoking status: Former Smoker    Types: Cigarettes  Quit date: 07/18/2018    Years since quitting: 1.6  . Smokeless tobacco: Never Used  Substance and Sexual Activity  . Alcohol use: Yes    Comment: rarely  . Drug use: Never  . Sexual activity: Yes    Birth control/protection: Surgical  Other Topics Concern  . Not on file  Social History Narrative  . Not on file   Social Determinants of Health   Financial Resource Strain:   . Difficulty of Paying Living Expenses:   Food Insecurity:   . Worried About Charity fundraiser in the Last Year:   . Arts development officer in the Last Year:   Transportation Needs:   . Film/video editor (Medical):   Marland Kitchen Lack of Transportation (Non-Medical):   Physical Activity:   . Days of Exercise per Week:   . Minutes of Exercise per Session:   Stress:   . Feeling of Stress :   Social Connections:   . Frequency of Communication with Friends and Family:   . Frequency of Social Gatherings with Friends and Family:   . Attends Religious Services:   . Active Member of Clubs or Organizations:   . Attends Archivist Meetings:   Marland Kitchen Marital Status:     Allergies: No Known Allergies  Metabolic Disorder Labs: Lab Results  Component Value Date   HGBA1C 6.0 (H) 12/31/2018   Lab Results  Component Value Date   PROLACTIN 14.9 12/31/2018   Lab Results  Component Value Date   CHOL 200 (H) 12/31/2018   TRIG 262 (H) 12/31/2018   HDL 48 12/31/2018   LDLCALC 100 (H) 12/31/2018   Lab Results  Component Value Date   TSH 1.340 10/29/2018    Therapeutic Level Labs: No results found for: LITHIUM Lab Results  Component Value Date   VALPROATE 44 (L) 06/05/2019   VALPROATE 49 (L) 05/22/2019   No components found for:  CBMZ  Current Medications: Current Outpatient Medications  Medication Sig Dispense Refill  . divalproex (DEPAKOTE ER) 500 MG 24 hr tablet TAKE 3 TABLETS(1500 MG) BY MOUTH DAILY 270 tablet 0  . hydrOXYzine (ATARAX/VISTARIL) 25 MG tablet Take 0.5-1 tablets (12.5-25 mg total) by mouth 3 (three) times daily as needed. Severe anxiety and irritability 90 tablet 1  . Multiple Vitamin (MULTI-VITAMIN DAILY PO) Take by mouth.    . traZODone (DESYREL) 100 MG tablet TAKE 1 TO 2 TABLETS(100 TO 200 MG) BY MOUTH AT BEDTIME FOR SLEEP 180 tablet 0   No current facility-administered medications for this visit.     Musculoskeletal: Strength & Muscle Tone: UTA Gait & Station: normal Patient leans: N/A  Psychiatric Specialty Exam: Review of Systems  Psychiatric/Behavioral: Negative for  agitation, behavioral problems, confusion, decreased concentration, dysphoric mood, hallucinations, self-injury, sleep disturbance and suicidal ideas. The patient is not nervous/anxious and is not hyperactive.   All other systems reviewed and are negative.   Last menstrual period 08/30/2019.There is no height or weight on file to calculate BMI.  General Appearance: Casual  Eye Contact:  Fair  Speech:  Clear and Coherent  Volume:  Normal  Mood:  Euthymic  Affect:  Congruent  Thought Process:  Goal Directed and Descriptions of Associations: Intact  Orientation:  Full (Time, Place, and Person)  Thought Content: Logical   Suicidal Thoughts:  No  Homicidal Thoughts:  No  Memory:  Immediate;   Fair Recent;   Fair Remote;   Fair  Judgement:  Fair  Insight:  Fair  Psychomotor  Activity:  Normal  Concentration:  Concentration: Fair and Attention Span: Fair  Recall:  [employer] of Knowledge: Fair  Language: Fair  Akathisia:  No  Handed:  Right  AIMS (if indicated): UTA  Assets:  Communication Skills Desire for Improvement Social Support  ADL's:  Intact  Cognition: WNL  Sleep:  Fair   Screenings: PHQ2-9     Office Visit from 11/25/2019 in [facility], [facility] Office Visit from 07/17/2019 in [facility], [facility] Nutrition from 06/11/2019 in [place] Office Visit from 01/16/2019 in [facility], [facility] Office Visit from 12/20/2018 in [facility], [facility]  PHQ-2 Total Score  1  0  0  0  1       Assessment and Plan: Gioia is a 39 year old Caucasian female who has a history of bipolar disorder in remission, anxiety disorder was evaluated by telemedicine today.  Patient is currently stable on current medication regimen.  She does have psychosocial stressors of upcoming bariatric surgery, pandemic, several deaths in the family.  Patient will continue to follow-up with her psychologist status post bariatric surgery.  Plan as noted  below.  Plan Bipolar disorder in remission Continue Depakote ER 1500 mg p.o. daily. Depakote level on 06/05/2019-44-subtherapeutic.  However her mood symptoms are stable and hence her medication dosage was not readjusted. Discussed with patient that status post bariatric surgery her Depakote level as well as her mood symptoms can be monitored closely.  If she is having any problems then her medication needs to be readjusted. Hydroxyzine 12.5 to 25 mg p.o. 3 times daily as needed for severe anxiety/irritability.   Insomnia-stable Trazodone 100 to 200 mg p.o. nightly  GAD-stable Hydroxyzine as needed  Specific phobia-making progress-flying Continue CBT  Follow-up in clinic in 1 month or sooner if needed.  I have spent atleast 20 minutes non face to face with patient today. More than 50 % of the time was spent for preparing to see the patient ( e.g., review of test, records ), obtaining and to review and separately obtained history , ordering medications and test ,psychoeducation and supportive psychotherapy and care coordination,as well as documenting clinical information in electronic health record. This note was generated in part or whole with voice recognition software. Voice recognition is usually quite accurate but there are transcription errors that can and very often do occur. I apologize for any typographical errors that were not detected and corrected.       Ursula Alert, MD 03/12/2020, 6:08 PM

## 2020-03-12 NOTE — Patient Instructions (Signed)
Follow-up in clinic on May 28, 10:20 AM

## 2020-03-19 ENCOUNTER — Telehealth: Payer: Self-pay

## 2020-03-19 NOTE — Telephone Encounter (Signed)
Confirmed appointment on 03/23/2020 and screened for covid. klh 

## 2020-03-23 ENCOUNTER — Ambulatory Visit (INDEPENDENT_AMBULATORY_CARE_PROVIDER_SITE_OTHER): Admitting: Adult Health

## 2020-03-23 ENCOUNTER — Other Ambulatory Visit: Payer: Self-pay

## 2020-03-23 ENCOUNTER — Encounter: Payer: Self-pay | Admitting: Adult Health

## 2020-03-23 VITALS — BP 143/85 | HR 96 | Temp 97.5°F | Resp 16 | Ht 67.0 in | Wt 296.8 lb

## 2020-03-23 DIAGNOSIS — Z6841 Body Mass Index (BMI) 40.0 and over, adult: Secondary | ICD-10-CM

## 2020-03-23 DIAGNOSIS — F17219 Nicotine dependence, cigarettes, with unspecified nicotine-induced disorders: Secondary | ICD-10-CM

## 2020-03-23 DIAGNOSIS — E781 Pure hyperglyceridemia: Secondary | ICD-10-CM

## 2020-03-23 DIAGNOSIS — F339 Major depressive disorder, recurrent, unspecified: Secondary | ICD-10-CM | POA: Diagnosis not present

## 2020-03-23 NOTE — Progress Notes (Signed)
[facility] [place], [place] [address]  Internal MEDICINE  Office Visit Note  Patient Name: Gloria Garza  [id number]  [id number]  Date of Service: 03/23/2020  Chief Complaint  Patient presents with  . Follow-up    paperwork for bariatric sx  . Depression    HPI Pt is here in need of medical letter of support for bariatric surgery.  This is the last step for final approval for her surgery.  She is planning to have surgery in the beginning of June.  She denies any new or worsening symptoms.     Current Medication: Outpatient Encounter Medications as of 03/23/2020  Medication Sig  . divalproex (DEPAKOTE ER) 500 MG 24 hr tablet TAKE 3 TABLETS(1500 MG) BY MOUTH DAILY  . hydrOXYzine (ATARAX/VISTARIL) 25 MG tablet Take 0.5-1 tablets (12.5-25 mg total) by mouth 3 (three) times daily as needed. Severe anxiety and irritability  . Multiple Vitamin (MULTI-VITAMIN DAILY PO) Take by mouth.  . traZODone (DESYREL) 100 MG tablet TAKE 1 TO 2 TABLETS(100 TO 200 MG) BY MOUTH AT BEDTIME FOR SLEEP   No facility-administered encounter medications on file as of 03/23/2020.    Surgical History: Past Surgical History:  Procedure Laterality Date  . CESAREAN SECTION    . CYSTOSCOPY  09/26/2019   Procedure: CYSTOSCOPY;  Surgeon: Gae Dry, MD;  Location: [facility] ORS;  Service: Gynecology;;  . LAPAROSCOPIC HYSTERECTOMY N/A 09/26/2019   Procedure: HYSTERECTOMY TOTAL LAPAROSCOPIC;  Surgeon: Gae Dry, MD;  Location: [facility] ORS;  Service: Gynecology;  Laterality: N/A;  . TUBAL LIGATION      Medical History: Past Medical History:  Diagnosis Date  . Anxiety   . Bipolar disorder ([place])   . Depression     Family History: Family History  Problem Relation Age of Onset  . Diabetes Maternal Grandmother   . Cancer - Colon Maternal Grandmother   . Diabetes Paternal Grandmother   . Heart disease Paternal Grandmother   . Alcohol abuse Brother   . Drug abuse Brother      Social History   Socioeconomic History  . Marital status: Married    Spouse name: terah  . Number of children: 2  . Years of education: Not on file  . Highest education level: Associate degree: occupational, Hotel manager, or vocational program  Occupational History  . Not on file  Tobacco Use  . Smoking status: Former Smoker    Types: Cigarettes    Quit date: 07/18/2018    Years since quitting: 1.6  . Smokeless tobacco: Never Used  Substance and Sexual Activity  . Alcohol use: Not Currently    Comment: rarely  . Drug use: Never  . Sexual activity: Yes    Birth control/protection: Surgical  Other Topics Concern  . Not on file  Social History Narrative  . Not on file   Social Determinants of Health   Financial Resource Strain:   . Difficulty of Paying Living Expenses:   Food Insecurity:   . Worried About Charity fundraiser in the Last Year:   . Arboriculturist in the Last Year:   Transportation Needs:   . Film/video editor (Medical):   Marland Kitchen Lack of Transportation (Non-Medical):   Physical Activity:   . Days of Exercise per Week:   . Minutes of Exercise per Session:   Stress:   . Feeling of Stress :   Social Connections:   . Frequency of Communication with Friends and Family:   .  Frequency of Social Gatherings with Friends and Family:   . Attends Religious Services:   . Active Member of Clubs or Organizations:   . Attends Archivist Meetings:   Marland Kitchen Marital Status:   Intimate Partner Violence:   . Fear of Current or Ex-Partner:   . Emotionally Abused:   Marland Kitchen Physically Abused:   . Sexually Abused:       Review of Systems  Constitutional: Negative for chills, fatigue and unexpected weight change.  HENT: Negative for congestion, rhinorrhea, sneezing and sore throat.   Eyes: Negative for photophobia, pain and redness.  Respiratory: Negative for cough, chest tightness and shortness of breath.   Cardiovascular: Negative for chest pain and palpitations.   Gastrointestinal: Negative for abdominal pain, constipation, diarrhea, nausea and vomiting.  Endocrine: Negative.   Genitourinary: Negative for dysuria and frequency.  Musculoskeletal: Negative for arthralgias, back pain, joint swelling and neck pain.  Skin: Negative for rash.  Allergic/Immunologic: Negative.   Neurological: Negative for tremors and numbness.  Hematological: Negative for adenopathy. Does not bruise/bleed easily.  Psychiatric/Behavioral: Negative for behavioral problems and sleep disturbance. The patient is not nervous/anxious.     Vital Signs: BP (!) 143/85   Pulse 96   Temp (!) 97.5 F (36.4 C)   Resp 16   Ht 5\' 7"  (1.702 m)   Wt 296 lb 12.8 oz (134.6 kg)   LMP 08/30/2019   SpO2 96%   BMI 46.49 kg/m    Physical Exam Vitals and nursing note reviewed.  Constitutional:      General: She is not in acute distress.    Appearance: She is well-developed. She is not diaphoretic.  HENT:     Head: Normocephalic and atraumatic.     Mouth/Throat:     Pharynx: No oropharyngeal exudate.  Eyes:     Pupils: Pupils are equal, round, and reactive to light.  Neck:     Thyroid: No thyromegaly.     Vascular: No JVD.     Trachea: No tracheal deviation.  Cardiovascular:     Rate and Rhythm: Normal rate and regular rhythm.     Heart sounds: Normal heart sounds. No murmur. No friction rub. No gallop.   Pulmonary:     Effort: Pulmonary effort is normal. No respiratory distress.     Breath sounds: Normal breath sounds. No wheezing or rales.  Chest:     Chest wall: No tenderness.  Abdominal:     Palpations: Abdomen is soft.     Tenderness: There is no abdominal tenderness. There is no guarding.  Musculoskeletal:        General: Normal range of motion.     Cervical back: Normal range of motion and neck supple.  Lymphadenopathy:     Cervical: No cervical adenopathy.  Skin:    General: Skin is warm and dry.  Neurological:     Mental Status: She is alert and oriented to  person, place, and time.     Cranial Nerves: No cranial nerve deficit.  Psychiatric:        Behavior: Behavior normal.        Thought Content: Thought content normal.        Judgment: Judgment normal.    Assessment/Plan: 1. Depression, recurrent ([place]) Stable, continue current management.   2. Hypertriglyceridemia Continue with lifestyle modifications.   3. Cigarette nicotine dependence with nicotine-induced disorder Smoking cessation counseling: 1. Pt acknowledges the risks of long term smoking, she will try to quite smoking. 2. Options for  different medications including nicotine products, chewing gum, patch etc, Wellbutrin and Chantix is discussed 3. Goal and date of compete cessation is discussed 4. Total time spent in smoking cessation is 15 min.  4. BMI 45.0-49.9, adult (HCC) Obesity Counseling: Risk Assessment: An assessment of behavioral risk factors was made today and includes lack of exercise sedentary lifestyle, lack of portion control and poor dietary habits.  Risk Modification Advice: She was counseled on portion control guidelines. Restricting daily caloric intake to 1600. The detrimental long term effects of obesity on her health and ongoing poor compliance was also discussed with the patient.  5. Morbid obesity ([place]) BMI over 40  General Counseling: Mixtli verbalizes understanding of the findings of todays visit and agrees with plan of treatment. I have discussed any further diagnostic evaluation that may be needed or ordered today. We also reviewed her medications today. she has been encouraged to call the office with any questions or concerns that should arise related to todays visit.    No orders of the defined types were placed in this encounter.   No orders of the defined types were placed in this encounter.   Time spent: 25 Minutes   This patient was seen by Orson Gear AGNP-C in Collaboration with Dr Lavera Guise as a part of collaborative care  agreement     Kendell Bane AGNP-C Internal medicine

## 2020-03-25 ENCOUNTER — Telehealth: Payer: Self-pay | Admitting: Psychiatry

## 2020-03-25 NOTE — Telephone Encounter (Signed)
Received request for letter from bariatric surgery provider-Per review of chart this was already sent out sometime in April.  I will print out letter again and will have CMA to send it to them today.

## 2020-03-26 ENCOUNTER — Telehealth: Payer: Self-pay

## 2020-03-26 NOTE — Telephone Encounter (Signed)
Letter was faxed and confirmed.

## 2020-04-17 ENCOUNTER — Encounter: Payer: Self-pay | Admitting: Psychiatry

## 2020-04-17 ENCOUNTER — Other Ambulatory Visit: Payer: Self-pay

## 2020-04-17 ENCOUNTER — Telehealth (INDEPENDENT_AMBULATORY_CARE_PROVIDER_SITE_OTHER): Admitting: Psychiatry

## 2020-04-17 ENCOUNTER — Telehealth: Payer: Self-pay

## 2020-04-17 DIAGNOSIS — F3177 Bipolar disorder, in partial remission, most recent episode mixed: Secondary | ICD-10-CM | POA: Diagnosis not present

## 2020-04-17 DIAGNOSIS — F5101 Primary insomnia: Secondary | ICD-10-CM | POA: Diagnosis not present

## 2020-04-17 DIAGNOSIS — F411 Generalized anxiety disorder: Secondary | ICD-10-CM

## 2020-04-17 DIAGNOSIS — Z79899 Other long term (current) drug therapy: Secondary | ICD-10-CM | POA: Diagnosis not present

## 2020-04-17 MED ORDER — DOXEPIN HCL 10 MG PO CAPS: 10.0000 mg | ORAL_CAPSULE | Freq: Every evening | ORAL | 1 refills | Status: DC | PRN

## 2020-04-17 MED ORDER — HYDROXYZINE HCL 25 MG PO TABS: 12.5000 mg | ORAL_TABLET | Freq: Three times a day (TID) | ORAL | 1 refills | Status: DC | PRN

## 2020-04-17 NOTE — Progress Notes (Signed)
Provider Location : [facility] Patient Location : Parent's home   Virtual Visit via Video Note  I connected with Gloria Garza on 04/17/20 at 10:20 AM EDT by a video enabled telemedicine application and verified that I am speaking with the correct person using two identifiers.   I discussed the limitations of evaluation and management by telemedicine and the availability of in person appointments. The patient expressed understanding and agreed to proceed.   I discussed the assessment and treatment plan with the patient. The patient was provided an opportunity to ask questions and all were answered. The patient agreed with the plan and demonstrated an understanding of the instructions.   The patient was advised to call back or seek an in-person evaluation if the symptoms worsen or if the condition fails to improve as anticipated.   [place] MD OP Progress Note  04/17/2020 1:46 PM Gloria Garza  MRN:  [id number]  Chief Complaint:  Chief Complaint    Follow-up     HPI: Gloria Garza is a 39 year old Caucasian female who has a history of bipolar disorder, lives in [place], married, was evaluated by telemedicine today.  Patient today reports she is currently doing well with regards to her mood symptoms.  She denies any significant mood lability.  She however reports she is anxious about the upcoming bariatric surgery.  She reports her surgery scheduled for June 22.  She looks forward to that.  Patient today reports she has also noticed some sleep issues as her surgery is getting closer.  She has difficulty falling asleep.  She reports it looks like the trazodone has lost its effect.  Patient denies any suicidality or homicidality or perceptual disturbances.  She is planning to spend her Memorial Day weekend with her family in [place].  She reports it is also her dad's birthday and she is planning to have a small get together.  Patient denies any other concerns today.  Visit Diagnosis:     ICD-10-CM   1. Bipolar disorder, in partial remission, most recent episode mixed (HCC)  F31.77 hydrOXYzine (ATARAX/VISTARIL) 25 MG tablet    doxepin (SINEQUAN) 10 MG capsule  2. Primary insomnia  F51.01   3. GAD (generalized anxiety disorder)  F41.1   4. High risk medication use  Z79.899 Valproic acid level    CBC With Differential    Comprehensive metabolic panel    Past Psychiatric History: I have reviewed past psychiatric history from my progress note on 12/18/2018.  Past trials of Zoloft.  Past Medical History:  Past Medical History:  Diagnosis Date  . Anxiety   . Bipolar disorder ([place])   . Depression     Past Surgical History:  Procedure Laterality Date  . CESAREAN SECTION    . CYSTOSCOPY  09/26/2019   Procedure: CYSTOSCOPY;  Surgeon: Gae Dry, MD;  Location: [facility] ORS;  Service: Gynecology;;  . LAPAROSCOPIC HYSTERECTOMY N/A 09/26/2019   Procedure: HYSTERECTOMY TOTAL LAPAROSCOPIC;  Surgeon: Gae Dry, MD;  Location: [facility] ORS;  Service: Gynecology;  Laterality: N/A;  . TUBAL LIGATION      Family Psychiatric History: I have reviewed family psychiatric history from my progress note on 12/18/2018 Family History:  Family History  Problem Relation Age of Onset  . Diabetes Maternal Grandmother   . Cancer - Colon Maternal Grandmother   . Diabetes Paternal Grandmother   . Heart disease Paternal Grandmother   . Alcohol abuse Brother   . Drug abuse Brother     Social History: Reviewed social history  from my progress note on 12/18/2018 Social History   Socioeconomic History  . Marital status: Married    Spouse name: terah  . Number of children: 2  . Years of education: Not on file  . Highest education level: Associate degree: occupational, Hotel manager, or vocational program  Occupational History  . Not on file  Tobacco Use  . Smoking status: Former Smoker    Types: Cigarettes    Quit date: 07/18/2018    Years since quitting: 1.7  . Smokeless tobacco: Never Used   Substance and Sexual Activity  . Alcohol use: Not Currently    Comment: rarely  . Drug use: Never  . Sexual activity: Yes    Birth control/protection: Surgical  Other Topics Concern  . Not on file  Social History Narrative  . Not on file   Social Determinants of Health   Financial Resource Strain:   . Difficulty of Paying Living Expenses:   Food Insecurity:   . Worried About Charity fundraiser in the Last Year:   . Arboriculturist in the Last Year:   Transportation Needs:   . Film/video editor (Medical):   Marland Kitchen Lack of Transportation (Non-Medical):   Physical Activity:   . Days of Exercise per Week:   . Minutes of Exercise per Session:   Stress:   . Feeling of Stress :   Social Connections:   . Frequency of Communication with Friends and Family:   . Frequency of Social Gatherings with Friends and Family:   . Attends Religious Services:   . Active Member of Clubs or Organizations:   . Attends Archivist Meetings:   Marland Kitchen Marital Status:     Allergies: No Known Allergies  Metabolic Disorder Labs: Lab Results  Component Value Date   HGBA1C 6.0 (H) 12/31/2018   Lab Results  Component Value Date   PROLACTIN 14.9 12/31/2018   Lab Results  Component Value Date   CHOL 200 (H) 12/31/2018   TRIG 262 (H) 12/31/2018   HDL 48 12/31/2018   LDLCALC 100 (H) 12/31/2018   Lab Results  Component Value Date   TSH 1.340 10/29/2018    Therapeutic Level Labs: No results found for: LITHIUM Lab Results  Component Value Date   VALPROATE 44 (L) 06/05/2019   VALPROATE 49 (L) 05/22/2019   No components found for:  CBMZ  Current Medications: Current Outpatient Medications  Medication Sig Dispense Refill  . divalproex (DEPAKOTE ER) 500 MG 24 hr tablet TAKE 3 TABLETS(1500 MG) BY MOUTH DAILY 270 tablet 0  . doxepin (SINEQUAN) 10 MG capsule Take 1-2 capsules (10-20 mg total) by mouth at bedtime as needed. For sleep 60 capsule 1  . hydrOXYzine (ATARAX/VISTARIL) 25 MG  tablet Take 0.5-1 tablets (12.5-25 mg total) by mouth 3 (three) times daily as needed. Severe anxiety and irritability 90 tablet 1  . Multiple Vitamin (MULTI-VITAMIN DAILY PO) Take by mouth.     No current facility-administered medications for this visit.     Musculoskeletal: Strength & Muscle Tone: UTA Gait & Station: normal Patient leans: N/A  Psychiatric Specialty Exam: Review of Systems  Psychiatric/Behavioral: Positive for sleep disturbance. The patient is nervous/anxious.   All other systems reviewed and are negative.   Last menstrual period 08/30/2019.There is no height or weight on file to calculate BMI.  General Appearance: Casual  Eye Contact:  Fair  Speech:  Clear and Coherent  Volume:  Normal  Mood:  Anxious  Affect:  Congruent  Thought  Process:  Goal Directed and Descriptions of Associations: Intact  Orientation:  Full (Time, Place, and Person)  Thought Content: Logical   Suicidal Thoughts:  No  Homicidal Thoughts:  No  Memory:  Immediate;   Fair Recent;   Fair Remote;   Fair  Judgement:  Fair  Insight:  Fair  Psychomotor Activity:  Normal  Concentration:  Concentration: Fair and Attention Span: Fair  Recall:  [employer] of Knowledge: Fair  Language: Fair  Akathisia:  No  Handed:  Right  AIMS (if indicated): UTA  Assets:  Communication Skills Desire for Improvement Housing  ADL's:  Intact  Cognition: WNL  Sleep:  Restless   Screenings: PHQ2-9     Office Visit from 11/25/2019 in [facility], [facility] Office Visit from 07/17/2019 in [facility], [facility] Nutrition from 06/11/2019 in [place] Office Visit from 01/16/2019 in [facility], [facility] Office Visit from 12/20/2018 in [facility], [facility]  PHQ-2 Total Score  1  0  0  0  1       Assessment and Plan: Gloria Garza is a 39 year old Caucasian female who has a history of bipolar disorder in remission, anxiety disorder was evaluated by telemedicine  today.  She does have psychosocial stressors of upcoming bariatric surgery, the pandemic, several deaths in the family.  Patient is currently struggling with sleep issues.  Plan as noted below.  Plan Bipolar disorder in remission Depakote ER 1500 mg p.o. daily Depakote level on 06/05/2019-44-subtherapeutic. Hydroxyzine 12.5 to 25 mg p.o. 3 times daily as needed for severe anxiety/irritability.  Insomnia-unstable Discontinue trazodone. Start doxepin 10 to 20 mg p.o. nightly as needed She also has hydroxyzine as needed for anxiety at bedtime.  GAD-stable Hydroxyzine as needed  Special phobia-flying-stable Continue CBT  High risk medication use-will order Depakote level, CMP, CBC with differential.  She will go to [facility] and get it done.  Follow-up in clinic in 2 months or sooner if needed.   I have spent atleast 20 minutes non face to face with patient today. More than 50 % of the time was spent for preparing to see the patient ( e.g., review of test, records ), obtaining and to review and separately obtained history , ordering medications and test ,psychoeducation and supportive psychotherapy and care coordination,as well as documenting clinical information in electronic health record. This note was generated in part or whole with voice recognition software. Voice recognition is usually quite accurate but there are transcription errors that can and very often do occur. I apologize for any typographical errors that were not detected and corrected.      Ursula Alert, MD 04/17/2020, 1:46 PM

## 2020-04-17 NOTE — Telephone Encounter (Signed)
faxed and confirmed labwork orders sent.  

## 2020-05-13 ENCOUNTER — Ambulatory Visit: Payer: Self-pay | Admitting: General Surgery

## 2020-05-13 NOTE — H&P (View-Only) (Signed)
Oren Bracket Appointment: 04/23/2020 9:30 AM Location: [facility] Patient #: [id number] DOB: 10/06/1981 Married / Language: Cleophus Molt / Race: Undefined Female   History of Present Illness Randall Hiss M. Kwinton Maahs MD; 04/23/2020 10:17 AM) The patient is a 39 year old female who presents for a bariatric surgery evaluation. She comes in for long-term follow-up regarding her severe obesity. I initially met her last summer. She has completed the bariatric surgery pathway. She did undergo a laparoscopic-assisted vaginal hysterectomy December for bleeding. She states that she recovered quite well. Otherwise she denies any significant medical changes since she was last seen. Her chest x-ray and upper GI were unremarkable. She did have a home sleep test on January 2020 which I reviewed which was negative for/low risk. She denies any unplanned trips the emergency room or hospital. She has not had any psychiatric admissions. She continues to have regular psychiatric follow-up with her psychiatrist. I reviewed her last office note from Apr 17, 2020. Psychiatrist is Dr. Shea Evans. Her lab work from March 2021 showed a A1c of 6.3. Triglycerides were 316, LDL 122, total cholesterol 231. She states her PCP is doing lifestyle management for her hypertriglyceridemia. She currently denies smoking. She has occasional heartburn with certain foods. Otherwise she denies any chest pain, chest pressure, shortness of breath, dizziness exertion, angina, paroxysmal nocturnal dyspnea.    04/2019 She is referred by [facility] for evaluation of weight loss surgery. She completed our Neurosurgeon. She is interested in the sleeve gastrectomy. She initially thought about weight loss surgery a few years ago but decided to try weight loss on her own but was unsuccessful. Since then she has continued to struggle with losing weight. Despite several attempts at sustained weight loss she has been unsuccessful.  She has tried Weight Watchers, ketogenic, as well as working with the nutritionist-all without any long-term success.  Her comorbidities include prediabetes, low back pain, family history of diabetes mellitus, family history of heart disease  She denies any chest pain, chest pressure, shortness of breath, orthopnea, paroxysmal nocturnal dyspnea, dyspnea on exertion, peripheral edema, personal family history of blood clots. She states that she has sleep study but it was negative. She denies any trouble swallowing liquids or solids. She will occasionally have some heartburn but it is only with certain foods like tomato sauce. She has a bowel movement several times a day. She denies any melena or hematochezia. She has had a tubal ligation. She denies any dysuria or hematuria. She has chronic low back pain without sciatica. She has prediabetes. She denies any TIAs or amaurosis fugax. She denies any severe migraines.  She denies any tobacco or drug use. She may have alcohol a few times a year.  Her wife is a Artist  She has bipolar one disorder. She sees a Teacher, music at [facility] every 90 days and sees a therapist once a day. She denies any hospitalizations for her bipolar disease. She states that her last manic episode was about 6-8 months ago   Problem List/Past Medical Randall Hiss M. Redmond Pulling, MD; 04/23/2020 10:19 AM) BIPOLAR 1 DISORDER (F31.9)  FAMILY HISTORY OF DIABETES MELLITUS (Z83.3)  ELEVATED BLOOD PRESSURE READING (R03.0)  FAMILY HISTORY OF HEART DISEASE (Z82.49)  SEVERE OBESITY (E66.01)  HYPERTRIGLYCERIDEMIA (E78.1)  PREDIABETES (R73.03)   Past Surgical History Randall Hiss M. Redmond Pulling, MD; 04/23/2020 10:19 AM) No pertinent past surgical history   Diagnostic Studies History Randall Hiss M. Redmond Pulling, MD; 04/23/2020 10:19 AM) Colonoscopy  never Mammogram  >3  years ago Pap Smear  1-5 years ago  Allergies (Tanisha A. Owens Shark, [place]; 04/23/2020 9:24 AM) No  Known Drug Allergies  [05/15/2019]: Allergies Reconciled   Medication History (Tanisha A. Owens Shark, [place]; 04/23/2020 9:25 AM) Divalproex Sodium ER (500MG Tablet ER 24HR, Oral) Active. hydrOXYzine HCl (25MG Tablet, Oral) Active. Doxepin HCl (10MG Capsule, Oral) Active. Medications Reconciled  Social History Randall Hiss M. Redmond Pulling, MD; 04/23/2020 10:19 AM) Alcohol use  Occasional alcohol use. No caffeine use  No drug use  Tobacco use  Never smoker.  Family History Randall Hiss M. Redmond Pulling, MD; 04/23/2020 10:19 AM) First Degree Relatives  No pertinent family history   Pregnancy / Birth History Leighton Ruff. Redmond Pulling, MD; 04/23/2020 10:19 AM) Age at menarche  7 years. Contraceptive History  Depo-provera, Oral contraceptives. Gravida  3 Maternal age  26-25 Para  2 Regular periods   Other Problems Randall Hiss M. Redmond Pulling, MD; 04/23/2020 10:19 AM) Anxiety Disorder     Review of Systems Randall Hiss M. Patrick Sohm MD; 04/23/2020 10:14 AM) General Present- Weight Gain. Not Present- Appetite Loss, Chills, Fatigue, Fever, Night Sweats and Weight Loss. Skin Not Present- Change in Wart/Mole, Dryness, Hives, Jaundice, New Lesions, Non-Healing Wounds, Rash and Ulcer. HEENT Not Present- Earache, Hearing Loss, Hoarseness, Nose Bleed, Oral Ulcers, Ringing in the Ears, Seasonal Allergies, Sinus Pain, Sore Throat, Visual Disturbances, Wears glasses/contact lenses and Yellow Eyes. Respiratory Not Present- Bloody sputum, Chronic Cough, Difficulty Breathing, Snoring and Wheezing. Breast Not Present- Breast Mass, Breast Pain, Nipple Discharge and Skin Changes. Cardiovascular Not Present- Chest Pain, Difficulty Breathing Lying Down, Leg Cramps, Palpitations, Rapid Heart Rate, Shortness of Breath and Swelling of Extremities. Gastrointestinal Not Present- Abdominal Pain, Bloating, Bloody Stool, Change in Bowel Habits, Chronic diarrhea, Constipation, Difficulty Swallowing, Excessive gas, Gets full quickly at meals, Hemorrhoids, Indigestion,  Nausea, Rectal Pain and Vomiting. Female Genitourinary Not Present- Frequency, Nocturia, Painful Urination, Pelvic Pain and Urgency. Musculoskeletal Not Present- Back Pain, Joint Pain, Joint Stiffness, Muscle Pain, Muscle Weakness and Swelling of Extremities. Neurological Not Present- Decreased Memory, Fainting, Headaches, Numbness, Seizures, Tingling, Tremor, Trouble walking and Weakness. Psychiatric Present- Bipolar. Not Present- Anxiety, Change in Sleep Pattern, Depression, Fearful and Frequent crying. Endocrine Not Present- Cold Intolerance, Excessive Hunger, Hair Changes, Heat Intolerance, Hot flashes and New Diabetes. Hematology Not Present- Blood Thinners, Easy Bruising, Excessive bleeding, Gland problems, HIV and Persistent Infections.  Vitals (Tanisha A. Brown RMA; 04/23/2020 9:25 AM) 04/23/2020 9:25 AM Weight: 301.2 lb Height: 67in Body Surface Area: 2.41 m Body Mass Index: 47.17 kg/m  Temp.: 98.31F  Pulse: 78 (Regular)  BP: 132/84(Sitting, Left Arm, Standard)       Physical Exam Randall Hiss M. Aleece Loyd MD; 04/23/2020 10:14 AM) General Mental Status-Alert. General Appearance-Consistent with stated age. Hydration-Well hydrated. Voice-Normal. Note: severe obesity, truncal; chaperone - tanisha   Head and Neck Head-normocephalic, atraumatic with no lesions or palpable masses. Trachea-midline. Thyroid Gland Characteristics - normal size and consistency.  Eye Eyeball - Bilateral-Extraocular movements intact. Sclera/Conjunctiva - Bilateral-No scleral icterus.  ENMT Ears Pinna - Bilateral - no bony growth in lateral aspect of ear canal, no edema. Nose and Sinuses External Inspection of the Nose - symmetric, no crepitus palpated.  Chest and Lung Exam Chest and lung exam reveals -quiet, even and easy respiratory effort with no use of accessory muscles and on auscultation, normal breath sounds, no adventitious sounds and normal vocal  resonance. Inspection Chest Wall - Normal. Back - normal.  Breast - Did not examine.  Cardiovascular Cardiovascular examination reveals -normal heart sounds, regular rate and rhythm  with no murmurs and normal pedal pulses bilaterally.  Abdomen Inspection Inspection of the abdomen reveals - No Hernias. Skin - Scar - no surgical scars. Palpation/Percussion Palpation and Percussion of the abdomen reveal - Soft, Non Tender, No Rebound tenderness, No Rigidity (guarding) and No hepatosplenomegaly. Auscultation Auscultation of the abdomen reveals - Bowel sounds normal.  Peripheral Vascular Upper Extremity Palpation - Pulses bilaterally normal.  Neurologic Neurologic evaluation reveals -alert and oriented x 3 with no impairment of recent or remote memory. Mental Status-Normal.  Neuropsychiatric The patient's mood and affect are described as -normal. Judgment and Insight-insight is appropriate concerning matters relevant to self.  Musculoskeletal Normal Exam - Left-Upper Extremity Strength Normal and Lower Extremity Strength Normal. Normal Exam - Right-Upper Extremity Strength Normal and Lower Extremity Strength Normal.  Lymphatic Head & Neck  General Head & Neck Lymphatics: Bilateral - Description - Normal. Axillary - Did not examine. Femoral & Inguinal - Did not examine.    Assessment & Plan Randall Hiss M. Prisha Hiley MD; 04/23/2020 10:19 AM) SEVERE OBESITY (E66.01) Impression: The patient meets weight loss surgery criteria. I think the patient would be an acceptable candidate for Laparoscopic vertical sleeve gastrectomy.  We reviewed her bariatric surgery workup. She has received psychological clearance. Upper GI and chest x-ray was unremarkable. She does have a slightly elevated ALT which probably reflects some component of fatty liver.  She has had her preoperative education class. She has signed the surgical per minute. I did offer to rediscussed the actual steps of the  procedure as well as the risk and benefits which we extensively discussed that her initial visit but she declined. We rediscussed the typical postoperative course. We discussed the typical issues that we see after surgery. All of her questions were asked and answered. We discussed the importance of the preoperative meal plan.  This patient encounter took 24 minutes today to perform the following: take history, perform exam, review outside records, interpret imaging, counsel the patient on their diagnosis and document encounter, findings & plan in the EHR Current Plans Pt Education - EMW_preopbariatric NICOTINE (12197) FAMILY HISTORY OF DIABETES MELLITUS (Z83.3) BIPOLAR 1 DISORDER (F31.9) Impression: appears stable and has regular f/u with her behavioral team (psychiatrist and therapist) [place] (Z82.49) PREDIABETES (R73.03) HYPERTRIGLYCERIDEMIA (E78.1)  Leighton Ruff. Redmond Pulling, MD, FACS General, Bariatric, & Minimally Invasive Surgery [facility], [place]

## 2020-05-13 NOTE — Progress Notes (Signed)
PCP - Orson Gear , NP Cardiologist -   PPM/ICD -  Device Orders -  Rep Notified -   Chest x-ray -  EKG -  Stress Test -  ECHO -  Cardiac Cath -   Sleep Study -  CPAP -   Fasting Blood Sugar -  Checks Blood Sugar _____ times a day  Blood Thinner Instructions: Aspirin Instructions:  ERAS Protcol - PRE-SURGERY Ensure or G2-   COVID TEST-    Anesthesia review:   Patient denies shortness of breath, fever, cough and chest pain at PAT appointment   All instructions explained to the patient, with a verbal understanding of the material. Patient agrees to go over the instructions while at home for a better understanding. Patient also instructed to self quarantine after being tested for COVID-19. The opportunity to ask questions was provided.

## 2020-05-13 NOTE — Patient Instructions (Addendum)
DUE TO COVID-19 ONLY ONE VISITOR IS ALLOWED TO COME WITH YOU AND STAY IN THE WAITING ROOM ONLY DURING PRE OP AND PROCEDURE DAY OF SURGERY. Two  VISITOR MAY VISIT WITH YOU AFTER SURGERY IN YOUR PRIVATE ROOM DURING VISITING HOURS ONLY!  10-a-8p  YOU NEED TO HAVE A COVID 19 TEST ON_7-2-21______ @__12 :05 pm_____, THIS TEST MUST BE DONE BEFORE SURGERY, COME  [address], [place] , [address].  ([place]) ONCE YOUR COVID TEST IS COMPLETED, PLEASE BEGIN THE QUARANTINE INSTRUCTIONS AS OUTLINED IN YOUR HANDOUT.                Lizbeth Feijoo  05/13/2020   Your procedure is scheduled on: 05-26-20   Report [facility] Main  Entrance   Report to  Short stay    at       0530 AM     Call this number if you have problems the morning of surgery [phone]    Remember: MORNING OF SURGERY DRINK:   DRINK 1 G2 drink BEFORE YOU LEAVE HOME, DRINK ALL OF THE  G2 DRINK AT ONE TIME.   NO SOLID FOOD AFTER 600 PM THE NIGHT BEFORE YOUR SURGERY. YOU MAY DRINK CLEAR FLUIDS. THE G2 DRINK YOU DRINK BEFORE YOU LEAVE HOME WILL BE THE LAST FLUIDS YOU DRINK BEFORE SURGERY.  PAIN IS EXPECTED AFTER SURGERY AND WILL NOT BE COMPLETELY ELIMINATED. AMBULATION AND TYLENOL WILL HELP REDUCE INCISIONAL AND GAS PAIN. MOVEMENT IS KEY!  YOU ARE EXPECTED TO BE OUT OF BED WITHIN 4 HOURS OF ADMISSION TO YOUR PATIENT ROOM.  SITTING IN THE RECLINER THROUGHOUT THE DAY IS IMPORTANT FOR DRINKING FLUIDS AND MOVING GAS THROUGHOUT THE GI TRACT.  COMPRESSION STOCKINGS SHOULD BE WORN [place] UNLESS YOU ARE WALKING.   INCENTIVE SPIROMETER SHOULD BE USED EVERY HOUR WHILE AWAKE TO DECREASE POST-OPERATIVE COMPLICATIONS SUCH AS PNEUMONIA.  WHEN DISCHARGED HOME, IT IS IMPORTANT TO CONTINUE TO WALK EVERY HOUR AND USE THE INCENTIVE SPIROMETER EVERY HOUR.          BRUSH YOUR TEETH MORNING OF SURGERY AND RINSE YOUR MOUTH OUT, NO CHEWING GUM CANDY OR MINTS.     Take these medicines the morning  of surgery with A SIP OF WATER: depakote, hydroxyzine if needed                                 You may not have any metal on your body including hair pins and              piercings  Do not wear jewelry, make-up, lotions, powders or perfumes, deodorant             Do not wear nail polish on your fingernails.  Do not shave  48 hours prior to surgery.             Do not bring valuables to the hospital. [place].  Contacts, dentures or bridgework may not be worn into surgery.       Patients discharged the day of surgery will not be allowed to drive home. IF YOU ARE HAVING SURGERY AND GOING HOME THE SAME DAY, YOU MUST HAVE AN ADULT TO DRIVE YOU HOME AND BE WITH YOU FOR 24 HOURS. YOU MAY GO HOME BY TAXI OR UBER OR ORTHERWISE,  BUT AN ADULT MUST ACCOMPANY YOU HOME AND STAY WITH YOU FOR 24 HOURS.  Name and phone number of your driver:  Special Instructions: N/A              Please read over the following fact sheets you were given: _____________________________________________________________________             [facility] - Preparing for Surgery Before surgery, you can play an important role.  Because skin is not sterile, your skin needs to be as free of germs as possible.  You can reduce the number of germs on your skin by washing with CHG (chlorahexidine gluconate) soap before surgery.  CHG is an antiseptic cleaner which kills germs and bonds with the skin to continue killing germs even after washing. Please DO NOT use if you have an allergy to CHG or antibacterial soaps.  If your skin becomes reddened/irritated stop using the CHG and inform your nurse when you arrive at Short Stay. Do not shave (including legs and underarms) for at least 48 hours prior to the first CHG shower.  You may shave your face/neck. Please follow these instructions carefully:  1.  Shower with CHG Soap the night before surgery and the  morning of Surgery.  2.  If  you choose to wash your hair, wash your hair first as usual with your  normal  shampoo.  3.  After you shampoo, rinse your hair and body thoroughly to remove the  shampoo.                           4.  Use CHG as you would any other liquid soap.  You can apply chg directly  to the skin and wash                       Gently with a scrungie or clean washcloth.  5.  Apply the CHG Soap to your body ONLY FROM THE NECK DOWN.   Do not use on face/ open                           Wound or open sores. Avoid contact with eyes, ears mouth and genitals (private parts).                       Wash face,  Genitals (private parts) with your normal soap.             6.  Wash thoroughly, paying special attention to the area where your surgery  will be performed.  7.  Thoroughly rinse your body with warm water from the neck down.  8.  DO NOT shower/wash with your normal soap after using and rinsing off  the CHG Soap.                9.  Pat yourself dry with a clean towel.            10.  Wear clean pajamas.            11.  Place clean sheets on your bed the night of your first shower and do not  sleep with pets. Day of Surgery : Do not apply any lotions/deodorants the morning of surgery.  Please wear clean clothes to the hospital/surgery center.  FAILURE TO FOLLOW THESE INSTRUCTIONS MAY RESULT IN THE CANCELLATION OF YOUR SURGERY PATIENT SIGNATURE_________________________________  NURSE SIGNATURE__________________________________  ________________________________________________________________________   Adam Phenix  An incentive spirometer is a tool that can help keep your lungs clear and active. This tool measures how well you are filling your lungs with each breath. Taking long deep breaths may help reverse or decrease the chance of developing breathing (pulmonary) problems (especially infection) following:  A long period of time when you are unable to move or be active. BEFORE THE PROCEDURE    If the spirometer includes an indicator to show your best effort, your nurse or respiratory therapist will set it to a desired goal.  If possible, sit up straight or lean slightly forward. Try not to slouch.  Hold the incentive spirometer in an upright position. INSTRUCTIONS FOR USE  1. Sit on the edge of your bed if possible, or sit up as far as you can in bed or on a chair. 2. Hold the incentive spirometer in an upright position. 3. Breathe out normally. 4. Place the mouthpiece in your mouth and seal your lips tightly around it. 5. Breathe in slowly and as deeply as possible, raising the piston or the ball toward the top of the column. 6. Hold your breath for 3-5 seconds or for as long as possible. Allow the piston or ball to fall to the bottom of the column. 7. Remove the mouthpiece from your mouth and breathe out normally. 8. Rest for a few seconds and repeat Steps 1 through 7 at least 10 times every 1-2 hours when you are awake. Take your time and take a few normal breaths between deep breaths. 9. The spirometer may include an indicator to show your best effort. Use the indicator as a goal to work toward during each repetition. 10. After each set of 10 deep breaths, practice coughing to be sure your lungs are clear. If you have an incision (the cut made at the time of surgery), support your incision when coughing by placing a pillow or rolled up towels firmly against it. Once you are able to get out of bed, walk around indoors and cough well. You may stop using the incentive spirometer when instructed by your caregiver.  RISKS AND COMPLICATIONS  Take your time so you do not get dizzy or light-headed.  If you are in pain, you may need to take or ask for pain medication before doing incentive spirometry. It is harder to take a deep breath if you are having pain. AFTER USE  Rest and breathe slowly and easily.  It can be helpful to keep track of a log of your progress. Your caregiver  can provide you with a simple table to help with this. If you are using the spirometer at home, follow these instructions: [place] IF:   You are having difficultly using the spirometer.  You have trouble using the spirometer as often as instructed.  Your pain medication is not giving enough relief while using the spirometer.  You develop fever of 100.5 F (38.1 C) or higher. SEEK IMMEDIATE MEDICAL CARE IF:   You cough up bloody sputum that had not been present before.  You develop fever of 102 F (38.9 C) or greater.  You develop worsening pain at or near the incision site. MAKE SURE YOU:   Understand these instructions.  Will watch your condition.  Will get help right away if you are not doing well or get worse. Document Released: 03/20/2007 Document Revised: 01/30/2012 Document Reviewed: 05/21/2007 [facility] Patient Information 2014 [place], [place].   ________________________________________________________________________

## 2020-05-13 NOTE — H&P (Signed)
Oren Bracket Appointment: 04/23/2020 9:30 AM Location: [facility] Patient #: [id number] DOB: 09/21/1981 Married / Language: Cleophus Molt / Race: Undefined Female   History of Present Illness Randall Hiss M. Oriya Kettering MD; 04/23/2020 10:17 AM) The patient is a 39 year old female who presents for a bariatric surgery evaluation. She comes in for long-term follow-up regarding her severe obesity. I initially met her last summer. She has completed the bariatric surgery pathway. She did undergo a laparoscopic-assisted vaginal hysterectomy December for bleeding. She states that she recovered quite well. Otherwise she denies any significant medical changes since she was last seen. Her chest x-ray and upper GI were unremarkable. She did have a home sleep test on January 2020 which I reviewed which was negative for/low risk. She denies any unplanned trips the emergency room or hospital. She has not had any psychiatric admissions. She continues to have regular psychiatric follow-up with her psychiatrist. I reviewed her last office note from Apr 17, 2020. Psychiatrist is Dr. Shea Evans. Her lab work from March 2021 showed a A1c of 6.3. Triglycerides were 316, LDL 122, total cholesterol 231. She states her PCP is doing lifestyle management for her hypertriglyceridemia. She currently denies smoking. She has occasional heartburn with certain foods. Otherwise she denies any chest pain, chest pressure, shortness of breath, dizziness exertion, angina, paroxysmal nocturnal dyspnea.    04/2019 She is referred by [facility] for evaluation of weight loss surgery. She completed our Neurosurgeon. She is interested in the sleeve gastrectomy. She initially thought about weight loss surgery a few years ago but decided to try weight loss on her own but was unsuccessful. Since then she has continued to struggle with losing weight. Despite several attempts at sustained weight loss she has been unsuccessful.  She has tried Weight Watchers, ketogenic, as well as working with the nutritionist-all without any long-term success.  Her comorbidities include prediabetes, low back pain, family history of diabetes mellitus, family history of heart disease  She denies any chest pain, chest pressure, shortness of breath, orthopnea, paroxysmal nocturnal dyspnea, dyspnea on exertion, peripheral edema, personal family history of blood clots. She states that she has sleep study but it was negative. She denies any trouble swallowing liquids or solids. She will occasionally have some heartburn but it is only with certain foods like tomato sauce. She has a bowel movement several times a day. She denies any melena or hematochezia. She has had a tubal ligation. She denies any dysuria or hematuria. She has chronic low back pain without sciatica. She has prediabetes. She denies any TIAs or amaurosis fugax. She denies any severe migraines.  She denies any tobacco or drug use. She may have alcohol a few times a year.  Her wife is a Artist  She has bipolar one disorder. She sees a Teacher, music at [facility] every 90 days and sees a therapist once a day. She denies any hospitalizations for her bipolar disease. She states that her last manic episode was about 6-8 months ago   Problem List/Past Medical Randall Hiss M. Redmond Pulling, MD; 04/23/2020 10:19 AM) BIPOLAR 1 DISORDER (F31.9)  FAMILY HISTORY OF DIABETES MELLITUS (Z83.3)  ELEVATED BLOOD PRESSURE READING (R03.0)  FAMILY HISTORY OF HEART DISEASE (Z82.49)  SEVERE OBESITY (E66.01)  HYPERTRIGLYCERIDEMIA (E78.1)  PREDIABETES (R73.03)   Past Surgical History Randall Hiss M. Redmond Pulling, MD; 04/23/2020 10:19 AM) No pertinent past surgical history   Diagnostic Studies History Randall Hiss M. Redmond Pulling, MD; 04/23/2020 10:19 AM) Colonoscopy  never Mammogram  >3  years ago Pap Smear  1-5 years ago  Allergies (Tanisha A. Owens Shark, [place]; 04/23/2020 9:24 AM) No  Known Drug Allergies  [05/15/2019]: Allergies Reconciled   Medication History (Tanisha A. Owens Shark, [place]; 04/23/2020 9:25 AM) Divalproex Sodium ER (500MG Tablet ER 24HR, Oral) Active. hydrOXYzine HCl (25MG Tablet, Oral) Active. Doxepin HCl (10MG Capsule, Oral) Active. Medications Reconciled  Social History Randall Hiss M. Redmond Pulling, MD; 04/23/2020 10:19 AM) Alcohol use  Occasional alcohol use. No caffeine use  No drug use  Tobacco use  Never smoker.  Family History Randall Hiss M. Redmond Pulling, MD; 04/23/2020 10:19 AM) First Degree Relatives  No pertinent family history   Pregnancy / Birth History Leighton Ruff. Redmond Pulling, MD; 04/23/2020 10:19 AM) Age at menarche  19 years. Contraceptive History  Depo-provera, Oral contraceptives. Gravida  3 Maternal age  38-25 Para  2 Regular periods   Other Problems Randall Hiss M. Redmond Pulling, MD; 04/23/2020 10:19 AM) Anxiety Disorder     Review of Systems Randall Hiss M. Shawnee Higham MD; 04/23/2020 10:14 AM) General Present- Weight Gain. Not Present- Appetite Loss, Chills, Fatigue, Fever, Night Sweats and Weight Loss. Skin Not Present- Change in Wart/Mole, Dryness, Hives, Jaundice, New Lesions, Non-Healing Wounds, Rash and Ulcer. HEENT Not Present- Earache, Hearing Loss, Hoarseness, Nose Bleed, Oral Ulcers, Ringing in the Ears, Seasonal Allergies, Sinus Pain, Sore Throat, Visual Disturbances, Wears glasses/contact lenses and Yellow Eyes. Respiratory Not Present- Bloody sputum, Chronic Cough, Difficulty Breathing, Snoring and Wheezing. Breast Not Present- Breast Mass, Breast Pain, Nipple Discharge and Skin Changes. Cardiovascular Not Present- Chest Pain, Difficulty Breathing Lying Down, Leg Cramps, Palpitations, Rapid Heart Rate, Shortness of Breath and Swelling of Extremities. Gastrointestinal Not Present- Abdominal Pain, Bloating, Bloody Stool, Change in Bowel Habits, Chronic diarrhea, Constipation, Difficulty Swallowing, Excessive gas, Gets full quickly at meals, Hemorrhoids, Indigestion,  Nausea, Rectal Pain and Vomiting. Female Genitourinary Not Present- Frequency, Nocturia, Painful Urination, Pelvic Pain and Urgency. Musculoskeletal Not Present- Back Pain, Joint Pain, Joint Stiffness, Muscle Pain, Muscle Weakness and Swelling of Extremities. Neurological Not Present- Decreased Memory, Fainting, Headaches, Numbness, Seizures, Tingling, Tremor, Trouble walking and Weakness. Psychiatric Present- Bipolar. Not Present- Anxiety, Change in Sleep Pattern, Depression, Fearful and Frequent crying. Endocrine Not Present- Cold Intolerance, Excessive Hunger, Hair Changes, Heat Intolerance, Hot flashes and New Diabetes. Hematology Not Present- Blood Thinners, Easy Bruising, Excessive bleeding, Gland problems, HIV and Persistent Infections.  Vitals (Tanisha A. Brown RMA; 04/23/2020 9:25 AM) 04/23/2020 9:25 AM Weight: 301.2 lb Height: 67in Body Surface Area: 2.41 m Body Mass Index: 47.17 kg/m  Temp.: 98.73F  Pulse: 78 (Regular)  BP: 132/84(Sitting, Left Arm, Standard)       Physical Exam Randall Hiss M. Gracen Southwell MD; 04/23/2020 10:14 AM) General Mental Status-Alert. General Appearance-Consistent with stated age. Hydration-Well hydrated. Voice-Normal. Note: severe obesity, truncal; chaperone - tanisha   Head and Neck Head-normocephalic, atraumatic with no lesions or palpable masses. Trachea-midline. Thyroid Gland Characteristics - normal size and consistency.  Eye Eyeball - Bilateral-Extraocular movements intact. Sclera/Conjunctiva - Bilateral-No scleral icterus.  ENMT Ears Pinna - Bilateral - no bony growth in lateral aspect of ear canal, no edema. Nose and Sinuses External Inspection of the Nose - symmetric, no crepitus palpated.  Chest and Lung Exam Chest and lung exam reveals -quiet, even and easy respiratory effort with no use of accessory muscles and on auscultation, normal breath sounds, no adventitious sounds and normal vocal  resonance. Inspection Chest Wall - Normal. Back - normal.  Breast - Did not examine.  Cardiovascular Cardiovascular examination reveals -normal heart sounds, regular rate and rhythm  with no murmurs and normal pedal pulses bilaterally.  Abdomen Inspection Inspection of the abdomen reveals - No Hernias. Skin - Scar - no surgical scars. Palpation/Percussion Palpation and Percussion of the abdomen reveal - Soft, Non Tender, No Rebound tenderness, No Rigidity (guarding) and No hepatosplenomegaly. Auscultation Auscultation of the abdomen reveals - Bowel sounds normal.  Peripheral Vascular Upper Extremity Palpation - Pulses bilaterally normal.  Neurologic Neurologic evaluation reveals -alert and oriented x 3 with no impairment of recent or remote memory. Mental Status-Normal.  Neuropsychiatric The patient's mood and affect are described as -normal. Judgment and Insight-insight is appropriate concerning matters relevant to self.  Musculoskeletal Normal Exam - Left-Upper Extremity Strength Normal and Lower Extremity Strength Normal. Normal Exam - Right-Upper Extremity Strength Normal and Lower Extremity Strength Normal.  Lymphatic Head & Neck  General Head & Neck Lymphatics: Bilateral - Description - Normal. Axillary - Did not examine. Femoral & Inguinal - Did not examine.    Assessment & Plan Randall Hiss M. Egan Berkheimer MD; 04/23/2020 10:19 AM) SEVERE OBESITY (E66.01) Impression: The patient meets weight loss surgery criteria. I think the patient would be an acceptable candidate for Laparoscopic vertical sleeve gastrectomy.  We reviewed her bariatric surgery workup. She has received psychological clearance. Upper GI and chest x-ray was unremarkable. She does have a slightly elevated ALT which probably reflects some component of fatty liver.  She has had her preoperative education class. She has signed the surgical per minute. I did offer to rediscussed the actual steps of the  procedure as well as the risk and benefits which we extensively discussed that her initial visit but she declined. We rediscussed the typical postoperative course. We discussed the typical issues that we see after surgery. All of her questions were asked and answered. We discussed the importance of the preoperative meal plan.  This patient encounter took 24 minutes today to perform the following: take history, perform exam, review outside records, interpret imaging, counsel the patient on their diagnosis and document encounter, findings & plan in the EHR Current Plans Pt Education - EMW_preopbariatric NICOTINE ([id number]) FAMILY HISTORY OF DIABETES MELLITUS (Z83.3) BIPOLAR 1 DISORDER (F31.9) Impression: appears stable and has regular f/u with her behavioral team (psychiatrist and therapist) [place] (Z82.49) PREDIABETES (R73.03) HYPERTRIGLYCERIDEMIA (E78.1)  Leighton Ruff. Redmond Pulling, MD, FACS General, Bariatric, & Minimally Invasive Surgery [facility], [place]

## 2020-05-14 ENCOUNTER — Other Ambulatory Visit: Payer: Self-pay

## 2020-05-14 ENCOUNTER — Encounter (HOSPITAL_COMMUNITY)
Admission: RE | Admit: 2020-05-14 | Discharge: 2020-05-14 | Disposition: A | Discharge status: Home / Self Care | Source: Ambulatory Visit | Attending: General Surgery | Admitting: General Surgery

## 2020-05-14 ENCOUNTER — Encounter (HOSPITAL_COMMUNITY): Payer: Self-pay

## 2020-05-14 DIAGNOSIS — Z01812 Encounter for preprocedural laboratory examination: Secondary | ICD-10-CM | POA: Insufficient documentation

## 2020-05-14 HISTORY — DX: Prediabetes: R73.03

## 2020-05-14 HISTORY — DX: Pneumonia, unspecified organism: J18.9

## 2020-05-15 ENCOUNTER — Encounter (HOSPITAL_COMMUNITY)
Admission: RE | Admit: 2020-05-15 | Discharge: 2020-05-15 | Disposition: A | Discharge status: Home / Self Care | Source: Ambulatory Visit | Attending: General Surgery | Admitting: General Surgery

## 2020-05-15 DIAGNOSIS — Z01812 Encounter for preprocedural laboratory examination: Secondary | ICD-10-CM | POA: Diagnosis not present

## 2020-05-15 LAB — CBC WITH DIFFERENTIAL/PLATELET
Abs Immature Granulocytes: 0.03 10*3/uL (ref 0.00–0.07)
Basophils Absolute: 0 10*3/uL (ref 0.0–0.1)
Basophils Relative: 0 %
Eosinophils Absolute: 0.1 10*3/uL (ref 0.0–0.5)
Eosinophils Relative: 1 %
HCT: 40.4 % (ref 36.0–46.0)
Hemoglobin: 12.7 g/dL (ref 12.0–15.0)
Immature Granulocytes: 0 %
Lymphocytes Relative: 22 %
Lymphs Abs: 1.5 10*3/uL (ref 0.7–4.0)
MCH: 29.8 pg (ref 26.0–34.0)
MCHC: 31.4 g/dL (ref 30.0–36.0)
MCV: 94.8 fL (ref 80.0–100.0)
Monocytes Absolute: 0.6 10*3/uL (ref 0.1–1.0)
Monocytes Relative: 8 %
Neutro Abs: 4.5 10*3/uL (ref 1.7–7.7)
Neutrophils Relative %: 69 %
Platelets: 174 10*3/uL (ref 150–400)
RBC: 4.26 MIL/uL (ref 3.87–5.11)
RDW: 14.8 % (ref 11.5–15.5)
WBC: 6.7 10*3/uL (ref 4.0–10.5)
nRBC: 0 % (ref 0.0–0.2)

## 2020-05-15 LAB — HEMOGLOBIN A1C
Hgb A1c MFr Bld: 6.9 % — ABNORMAL HIGH (ref 4.8–5.6)
Mean Plasma Glucose: 151.33 mg/dL

## 2020-05-15 LAB — COMPREHENSIVE METABOLIC PANEL
ALT: 35 U/L (ref 0–44)
AST: 30 U/L (ref 15–41)
Albumin: 4.1 g/dL (ref 3.5–5.0)
Alkaline Phosphatase: 59 U/L (ref 38–126)
Anion gap: 11 (ref 5–15)
BUN: 8 mg/dL (ref 6–20)
CO2: 26 mmol/L (ref 22–32)
Calcium: 8.8 mg/dL — ABNORMAL LOW (ref 8.9–10.3)
Chloride: 102 mmol/L (ref 98–111)
Creatinine, Ser: 0.56 mg/dL (ref 0.44–1.00)
GFR calc Af Amer: >60 mL/min (ref >60)
GFR calc non Af Amer: >60 mL/min (ref >60)
Glucose, Bld: 165 mg/dL — ABNORMAL HIGH (ref 70–99)
Potassium: 3.9 mmol/L (ref 3.5–5.1)
Sodium: 139 mmol/L (ref 135–145)
Total Bilirubin: 0.5 mg/dL (ref 0.3–1.2)
Total Protein: 7.5 g/dL (ref 6.5–8.1)

## 2020-05-15 LAB — TYPE AND SCREEN
ABO/RH(D): O POS
Antibody Screen: NEGATIVE

## 2020-05-15 LAB — ABO/RH: ABO/RH(D): O POS

## 2020-05-21 ENCOUNTER — Telehealth: Payer: Self-pay

## 2020-05-21 NOTE — Telephone Encounter (Signed)
Patient cancelled appointment on [phone] will call back to reschedule. klh

## 2020-05-22 ENCOUNTER — Other Ambulatory Visit (HOSPITAL_COMMUNITY)
Admission: RE | Admit: 2020-05-22 | Discharge: 2020-05-22 | Disposition: A | Discharge status: Home / Self Care | Source: Ambulatory Visit | Attending: General Surgery | Admitting: General Surgery

## 2020-05-22 DIAGNOSIS — Z20822 Contact with and (suspected) exposure to covid-19: Secondary | ICD-10-CM | POA: Insufficient documentation

## 2020-05-22 DIAGNOSIS — Z01812 Encounter for preprocedural laboratory examination: Secondary | ICD-10-CM | POA: Insufficient documentation

## 2020-05-22 LAB — SARS CORONAVIRUS 2 (TAT 6-24 HRS): SARS Coronavirus 2: NEGATIVE

## 2020-05-25 ENCOUNTER — Other Ambulatory Visit: Payer: Self-pay | Admitting: Psychiatry

## 2020-05-25 DIAGNOSIS — F3177 Bipolar disorder, in partial remission, most recent episode mixed: Secondary | ICD-10-CM

## 2020-05-25 MED ORDER — BUPIVACAINE LIPOSOME 1.3 % IJ SUSP
20.0000 mL | Freq: Once | INTRAMUSCULAR | Status: DC
  Filled 2020-05-25: qty 20

## 2020-05-25 NOTE — Anesthesia Preprocedure Evaluation (Addendum)
Anesthesia Evaluation  Patient identified by MRN, date of birth, ID band Patient awake    Reviewed: Allergy & Precautions, NPO status , Patient's Chart, lab work & pertinent test results  History of Anesthesia Complications Negative for: history of anesthetic complications  Airway Mallampati: II  TM Distance: >3 FB Neck ROM: Full    Dental no notable dental hx. (+) Teeth Intact, Dental Advisory Given   Pulmonary neg pulmonary ROS, neg sleep apnea, neg COPD, Not current smoker, former smoker,    Pulmonary exam normal breath sounds clear to auscultation       Cardiovascular (-) hypertension(-) Past MI and (-) CHF negative cardio ROS Normal cardiovascular exam(-) dysrhythmias (-) Valvular Problems/Murmurs Rhythm:Regular Rate:Normal     Neuro/Psych neg Seizures PSYCHIATRIC DISORDERS Anxiety Depression Bipolar Disorder negative neurological ROS  negative psych ROS   GI/Hepatic negative GI ROS, Neg liver ROS, neg GERD  ,  Endo/Other  negative endocrine ROSneg diabetes (Pt denies)Morbid obesity  Renal/GU negative Renal ROS  negative genitourinary   Musculoskeletal negative musculoskeletal ROS (+)   Abdominal   Peds negative pediatric ROS (+)  Hematology negative hematology ROS (+)   Anesthesia Other Findings   Reproductive/Obstetrics negative OB ROS                            Anesthesia Physical  Anesthesia Plan  ASA: III  Anesthesia Plan: General   Post-op Pain Management:    Induction: Intravenous  PONV Risk Score and Plan: 3 and Ondansetron, Dexamethasone and Treatment may vary due to age or medical condition  Airway Management Planned: Oral ETT  Additional Equipment:   Intra-op Plan:   Post-operative Plan: Extubation in OR  Informed Consent: I have reviewed the patients History and Physical, chart, labs and discussed the procedure including the risks, benefits and alternatives  for the proposed anesthesia with the patient or authorized representative who has indicated his/her understanding and acceptance.       Plan Discussed with: Anesthesiologist  Anesthesia Plan Comments: (  Note from 11/20 airway  Difficulty was anticipated, Difficult Airway- due to reduced neck mobility, Difficult Airway- due to large tongue and Difficult Airway- due to limited oral opening)       Anesthesia Quick Evaluation

## 2020-05-26 ENCOUNTER — Encounter (HOSPITAL_COMMUNITY): Payer: Self-pay | Admitting: General Surgery

## 2020-05-26 ENCOUNTER — Inpatient Hospital Stay (HOSPITAL_COMMUNITY): Admitting: Anesthesiology

## 2020-05-26 ENCOUNTER — Encounter (HOSPITAL_COMMUNITY)
Admission: RE | Disposition: A | Discharge status: Home / Self Care | Payer: Self-pay | Source: Other Acute Inpatient Hospital | Attending: General Surgery

## 2020-05-26 ENCOUNTER — Other Ambulatory Visit: Payer: Self-pay

## 2020-05-26 ENCOUNTER — Inpatient Hospital Stay (HOSPITAL_COMMUNITY)
Admission: RE | Admit: 2020-05-26 | Discharge: 2020-05-27 | DRG: 621 | Disposition: A | Discharge status: Home / Self Care | Source: Other Acute Inpatient Hospital | Attending: General Surgery | Admitting: General Surgery

## 2020-05-26 ENCOUNTER — Ambulatory Visit: Admitting: Adult Health

## 2020-05-26 DIAGNOSIS — Z833 Family history of diabetes mellitus: Secondary | ICD-10-CM

## 2020-05-26 DIAGNOSIS — G8929 Other chronic pain: Secondary | ICD-10-CM | POA: Diagnosis present

## 2020-05-26 DIAGNOSIS — K760 Fatty (change of) liver, not elsewhere classified: Secondary | ICD-10-CM | POA: Diagnosis present

## 2020-05-26 DIAGNOSIS — F319 Bipolar disorder, unspecified: Secondary | ICD-10-CM | POA: Diagnosis present

## 2020-05-26 DIAGNOSIS — Z6841 Body Mass Index (BMI) 40.0 and over, adult: Secondary | ICD-10-CM | POA: Diagnosis not present

## 2020-05-26 DIAGNOSIS — R7303 Prediabetes: Secondary | ICD-10-CM | POA: Diagnosis present

## 2020-05-26 DIAGNOSIS — K219 Gastro-esophageal reflux disease without esophagitis: Secondary | ICD-10-CM | POA: Diagnosis present

## 2020-05-26 DIAGNOSIS — K429 Umbilical hernia without obstruction or gangrene: Secondary | ICD-10-CM | POA: Diagnosis present

## 2020-05-26 DIAGNOSIS — Z90710 Acquired absence of both cervix and uterus: Secondary | ICD-10-CM

## 2020-05-26 DIAGNOSIS — E781 Pure hyperglyceridemia: Secondary | ICD-10-CM | POA: Diagnosis present

## 2020-05-26 DIAGNOSIS — Z8249 Family history of ischemic heart disease and other diseases of the circulatory system: Secondary | ICD-10-CM

## 2020-05-26 DIAGNOSIS — Z9851 Tubal ligation status: Secondary | ICD-10-CM

## 2020-05-26 DIAGNOSIS — F419 Anxiety disorder, unspecified: Secondary | ICD-10-CM | POA: Diagnosis present

## 2020-05-26 DIAGNOSIS — Z9884 Bariatric surgery status: Secondary | ICD-10-CM

## 2020-05-26 HISTORY — PX: ESOPHAGOGASTRODUODENOSCOPY: SHX5428

## 2020-05-26 HISTORY — PX: LAPAROSCOPIC GASTRIC SLEEVE RESECTION: SHX5895

## 2020-05-26 LAB — GLUCOSE, CAPILLARY
Glucose-Capillary: 201 mg/dL — ABNORMAL HIGH (ref 70–99)
Glucose-Capillary: 212 mg/dL — ABNORMAL HIGH (ref 70–99)
Glucose-Capillary: 167 mg/dL — ABNORMAL HIGH (ref 70–99)

## 2020-05-26 LAB — HEMOGLOBIN AND HEMATOCRIT, BLOOD
HCT: 40.1 % (ref 36.0–46.0)
Hemoglobin: 12.9 g/dL (ref 12.0–15.0)

## 2020-05-26 LAB — SURGICAL PATHOLOGY

## 2020-05-26 LAB — HCG, SERUM, QUALITATIVE: Preg, Serum: NEGATIVE

## 2020-05-26 SURGERY — GASTRECTOMY, SLEEVE, LAPAROSCOPIC
Anesthesia: General | Site: Abdomen

## 2020-05-26 MED ORDER — PROPOFOL 10 MG/ML IV BOLUS
INTRAVENOUS | Status: DC | PRN
  Administered 2020-05-26: 200 mg via INTRAVENOUS
  Administered 2020-05-26: 70 mg via INTRAVENOUS

## 2020-05-26 MED ORDER — FENTANYL CITRATE (PF) 100 MCG/2ML IJ SOLN
INTRAMUSCULAR | Status: DC | PRN
  Administered 2020-05-26: 50 ug via INTRAVENOUS
  Administered 2020-05-26: 100 ug via INTRAVENOUS
  Administered 2020-05-26: 50 ug via INTRAVENOUS

## 2020-05-26 MED ORDER — POTASSIUM CHLORIDE IN NACL 20-0.45 MEQ/L-% IV SOLN
INTRAVENOUS | Status: DC
  Filled 2020-05-26 (×5): qty 1000

## 2020-05-26 MED ORDER — EPHEDRINE SULFATE-NACL 50-0.9 MG/10ML-% IV SOSY
PREFILLED_SYRINGE | INTRAVENOUS | Status: DC | PRN
  Administered 2020-05-26 (×2): 10 mg via INTRAVENOUS

## 2020-05-26 MED ORDER — GABAPENTIN 300 MG PO CAPS
300.0000 mg | ORAL_CAPSULE | ORAL | Status: AC
  Administered 2020-05-26: 300 mg via ORAL
  Filled 2020-05-26: qty 1

## 2020-05-26 MED ORDER — ACETAMINOPHEN 160 MG/5ML PO SOLN: 325.0000 mg | ORAL | Status: DC | PRN

## 2020-05-26 MED ORDER — PROPOFOL 10 MG/ML IV BOLUS
INTRAVENOUS | Status: AC
  Filled 2020-05-26: qty 40

## 2020-05-26 MED ORDER — DIPHENHYDRAMINE HCL 50 MG/ML IJ SOLN: 12.5000 mg | Freq: Three times a day (TID) | INTRAMUSCULAR | Status: DC | PRN

## 2020-05-26 MED ORDER — SUGAMMADEX SODIUM 500 MG/5ML IV SOLN
INTRAVENOUS | Status: AC
  Filled 2020-05-26: qty 5

## 2020-05-26 MED ORDER — HYDROXYZINE HCL 25 MG PO TABS: 12.5000 mg | ORAL_TABLET | Freq: Three times a day (TID) | ORAL | Status: DC | PRN

## 2020-05-26 MED ORDER — CHLORHEXIDINE GLUCONATE 4 % EX LIQD: 60.0000 mL | Freq: Once | CUTANEOUS | Status: DC

## 2020-05-26 MED ORDER — ACETAMINOPHEN 325 MG PO TABS: 325.0000 mg | ORAL_TABLET | ORAL | Status: DC | PRN

## 2020-05-26 MED ORDER — GABAPENTIN 100 MG PO CAPS
200.0000 mg | ORAL_CAPSULE | Freq: Two times a day (BID) | ORAL | Status: DC
  Administered 2020-05-26 – 2020-05-27 (×3): 200 mg via ORAL
  Filled 2020-05-26 (×3): qty 2

## 2020-05-26 MED ORDER — ENOXAPARIN SODIUM 30 MG/0.3ML ~~LOC~~ SOLN
30.0000 mg | Freq: Two times a day (BID) | SUBCUTANEOUS | Status: DC
  Administered 2020-05-26 – 2020-05-27 (×2): 30 mg via SUBCUTANEOUS
  Filled 2020-05-26 (×2): qty 0.3

## 2020-05-26 MED ORDER — ONDANSETRON HCL 4 MG/2ML IJ SOLN
4.0000 mg | Freq: Once | INTRAMUSCULAR | Status: AC | PRN
  Administered 2020-05-26: 4 mg via INTRAVENOUS

## 2020-05-26 MED ORDER — ONDANSETRON HCL 4 MG/2ML IJ SOLN: 4.0000 mg | Freq: Four times a day (QID) | INTRAMUSCULAR | Status: DC | PRN

## 2020-05-26 MED ORDER — STERILE WATER FOR IRRIGATION IR SOLN
Status: DC | PRN
  Administered 2020-05-26: 1000 mL

## 2020-05-26 MED ORDER — ONDANSETRON HCL 4 MG/2ML IJ SOLN
INTRAMUSCULAR | Status: DC | PRN
  Administered 2020-05-26: 4 mg via INTRAVENOUS

## 2020-05-26 MED ORDER — ACETAMINOPHEN 160 MG/5ML PO SOLN
1000.0000 mg | Freq: Three times a day (TID) | ORAL | Status: DC
  Administered 2020-05-26 – 2020-05-27 (×2): 1000 mg via ORAL
  Filled 2020-05-26 (×2): qty 40.6

## 2020-05-26 MED ORDER — TRAZODONE HCL 100 MG PO TABS
100.0000 mg | ORAL_TABLET | Freq: Every evening | ORAL | Status: DC | PRN
  Administered 2020-05-27: 100 mg via ORAL
  Filled 2020-05-26: qty 1

## 2020-05-26 MED ORDER — 0.9 % SODIUM CHLORIDE (POUR BTL) OPTIME
TOPICAL | Status: DC | PRN
  Administered 2020-05-26: 1000 mL

## 2020-05-26 MED ORDER — FENTANYL CITRATE (PF) 100 MCG/2ML IJ SOLN
INTRAMUSCULAR | Status: AC
  Filled 2020-05-26: qty 2

## 2020-05-26 MED ORDER — BUPIVACAINE-EPINEPHRINE (PF) 0.25% -1:200000 IJ SOLN
INTRAMUSCULAR | Status: AC
  Filled 2020-05-26: qty 30

## 2020-05-26 MED ORDER — ORAL CARE MOUTH RINSE: 15.0000 mL | Freq: Once | OROMUCOSAL | Status: AC

## 2020-05-26 MED ORDER — INSULIN ASPART 100 UNIT/ML ~~LOC~~ SOLN
0.0000 [IU] | SUBCUTANEOUS | Status: DC
  Administered 2020-05-26: 7 [IU] via SUBCUTANEOUS
  Administered 2020-05-26: 4 [IU] via SUBCUTANEOUS
  Administered 2020-05-26: 7 [IU] via SUBCUTANEOUS
  Administered 2020-05-27 (×2): 3 [IU] via SUBCUTANEOUS

## 2020-05-26 MED ORDER — LACTATED RINGERS IR SOLN
Status: DC | PRN
  Administered 2020-05-26: 1000 mL

## 2020-05-26 MED ORDER — PANTOPRAZOLE SODIUM 40 MG IV SOLR
40.0000 mg | Freq: Every day | INTRAVENOUS | Status: DC
  Administered 2020-05-26: 40 mg via INTRAVENOUS
  Filled 2020-05-26: qty 40

## 2020-05-26 MED ORDER — OXYCODONE HCL 5 MG/5ML PO SOLN
5.0000 mg | Freq: Four times a day (QID) | ORAL | Status: DC | PRN
  Administered 2020-05-26 – 2020-05-27 (×2): 5 mg via ORAL
  Filled 2020-05-26 (×2): qty 5

## 2020-05-26 MED ORDER — LIDOCAINE 2% (20 MG/ML) 5 ML SYRINGE
INTRAMUSCULAR | Status: DC | PRN
  Administered 2020-05-26: 70 mg via INTRAVENOUS

## 2020-05-26 MED ORDER — ROCURONIUM BROMIDE 10 MG/ML (PF) SYRINGE
PREFILLED_SYRINGE | INTRAVENOUS | Status: DC | PRN
  Administered 2020-05-26: 10 mg via INTRAVENOUS
  Administered 2020-05-26: 20 mg via INTRAVENOUS
  Administered 2020-05-26: 50 mg via INTRAVENOUS

## 2020-05-26 MED ORDER — FENTANYL CITRATE (PF) 100 MCG/2ML IJ SOLN
25.0000 ug | INTRAMUSCULAR | Status: DC | PRN
  Administered 2020-05-26: 50 ug via INTRAVENOUS

## 2020-05-26 MED ORDER — MORPHINE SULFATE (PF) 4 MG/ML IV SOLN
1.0000 mg | INTRAVENOUS | Status: DC | PRN
  Administered 2020-05-26 – 2020-05-27 (×5): 3 mg via INTRAVENOUS
  Administered 2020-05-27: 2 mg via INTRAVENOUS
  Filled 2020-05-26 (×6): qty 1

## 2020-05-26 MED ORDER — OXYCODONE HCL 5 MG PO TABS: 5.0000 mg | ORAL_TABLET | Freq: Once | ORAL | Status: DC | PRN

## 2020-05-26 MED ORDER — SCOPOLAMINE 1 MG/3DAYS TD PT72
1.0000 | MEDICATED_PATCH | TRANSDERMAL | Status: DC
  Administered 2020-05-26: 1.5 mg via TRANSDERMAL
  Filled 2020-05-26: qty 1

## 2020-05-26 MED ORDER — SIMETHICONE 80 MG PO CHEW
80.0000 mg | CHEWABLE_TABLET | Freq: Four times a day (QID) | ORAL | Status: DC | PRN
  Administered 2020-05-27 (×2): 80 mg via ORAL
  Filled 2020-05-26 (×2): qty 1

## 2020-05-26 MED ORDER — FENTANYL CITRATE (PF) 100 MCG/2ML IJ SOLN
INTRAMUSCULAR | Status: AC
  Administered 2020-05-26: 50 ug via INTRAVENOUS
  Filled 2020-05-26: qty 2

## 2020-05-26 MED ORDER — EPHEDRINE 5 MG/ML INJ
INTRAVENOUS | Status: AC
  Filled 2020-05-26: qty 10

## 2020-05-26 MED ORDER — LACTATED RINGERS IV SOLN: INTRAVENOUS | Status: DC

## 2020-05-26 MED ORDER — KETAMINE HCL 10 MG/ML IJ SOLN
INTRAMUSCULAR | Status: AC
  Filled 2020-05-26: qty 1

## 2020-05-26 MED ORDER — ACETAMINOPHEN 500 MG PO TABS
1000.0000 mg | ORAL_TABLET | ORAL | Status: AC
  Administered 2020-05-26: 1000 mg via ORAL
  Filled 2020-05-26: qty 2

## 2020-05-26 MED ORDER — APREPITANT 40 MG PO CAPS
40.0000 mg | ORAL_CAPSULE | ORAL | Status: AC
  Administered 2020-05-26: 40 mg via ORAL
  Filled 2020-05-26: qty 1

## 2020-05-26 MED ORDER — SODIUM CHLORIDE 0.9 % IV SOLN
2.0000 g | INTRAVENOUS | Status: AC
  Administered 2020-05-26: 2 g via INTRAVENOUS
  Filled 2020-05-26: qty 2

## 2020-05-26 MED ORDER — ACETAMINOPHEN 500 MG PO TABS
1000.0000 mg | ORAL_TABLET | Freq: Three times a day (TID) | ORAL | Status: DC
  Administered 2020-05-26: 1000 mg via ORAL
  Filled 2020-05-26: qty 2

## 2020-05-26 MED ORDER — HYDRALAZINE HCL 20 MG/ML IJ SOLN
20.0000 mg | INTRAMUSCULAR | Status: DC | PRN
  Administered 2020-05-27: 20 mg via INTRAVENOUS
  Filled 2020-05-26 (×2): qty 1

## 2020-05-26 MED ORDER — ENSURE MAX PROTEIN PO LIQD
2.0000 [oz_av] | ORAL | Status: DC
  Administered 2020-05-27 (×3): 2 [oz_av] via ORAL

## 2020-05-26 MED ORDER — DIVALPROEX SODIUM ER 500 MG PO TB24
1500.0000 mg | ORAL_TABLET | Freq: Every day | ORAL | Status: DC
  Administered 2020-05-27: 1500 mg via ORAL
  Filled 2020-05-26: qty 3

## 2020-05-26 MED ORDER — CHLORHEXIDINE GLUCONATE 0.12 % MT SOLN
15.0000 mL | Freq: Once | OROMUCOSAL | Status: AC
  Administered 2020-05-26: 15 mL via OROMUCOSAL

## 2020-05-26 MED ORDER — KETAMINE HCL 10 MG/ML IJ SOLN
INTRAMUSCULAR | Status: DC | PRN
  Administered 2020-05-26 (×2): 15 mg via INTRAVENOUS

## 2020-05-26 MED ORDER — DEXAMETHASONE SODIUM PHOSPHATE 4 MG/ML IJ SOLN: 4.0000 mg | INTRAMUSCULAR | Status: DC

## 2020-05-26 MED ORDER — LIDOCAINE 2% (20 MG/ML) 5 ML SYRINGE
INTRAMUSCULAR | Status: DC | PRN
  Administered 2020-05-26: 1.5 mg/kg/h via INTRAVENOUS

## 2020-05-26 MED ORDER — ENALAPRILAT 1.25 MG/ML IV SOLN
1.2500 mg | Freq: Four times a day (QID) | INTRAVENOUS | Status: DC | PRN
  Filled 2020-05-26: qty 1

## 2020-05-26 MED ORDER — LIDOCAINE HCL 2 % IJ SOLN
INTRAMUSCULAR | Status: AC
  Filled 2020-05-26: qty 20

## 2020-05-26 MED ORDER — DEXAMETHASONE SODIUM PHOSPHATE 10 MG/ML IJ SOLN
INTRAMUSCULAR | Status: DC | PRN
  Administered 2020-05-26: 4 mg via INTRAVENOUS

## 2020-05-26 MED ORDER — SUGAMMADEX SODIUM 200 MG/2ML IV SOLN
INTRAVENOUS | Status: DC | PRN
Start: 2020-05-26 — End: 2020-05-26
  Administered 2020-05-26: 300 mg via INTRAVENOUS

## 2020-05-26 MED ORDER — BUPIVACAINE LIPOSOME 1.3 % IJ SUSP
INTRAMUSCULAR | Status: DC | PRN
  Administered 2020-05-26: 20 mL

## 2020-05-26 MED ORDER — DOXEPIN HCL 10 MG PO CAPS
10.0000 mg | ORAL_CAPSULE | Freq: Every day | ORAL | Status: DC
  Administered 2020-05-26: 10 mg via ORAL
  Filled 2020-05-26: qty 1

## 2020-05-26 MED ORDER — MIDAZOLAM HCL 2 MG/2ML IJ SOLN
INTRAMUSCULAR | Status: DC | PRN
  Administered 2020-05-26: 2 mg via INTRAVENOUS

## 2020-05-26 MED ORDER — MIDAZOLAM HCL 2 MG/2ML IJ SOLN
INTRAMUSCULAR | Status: AC
  Filled 2020-05-26: qty 2

## 2020-05-26 MED ORDER — HEPARIN SODIUM (PORCINE) 5000 UNIT/ML IJ SOLN
5000.0000 [IU] | INTRAMUSCULAR | Status: AC
  Administered 2020-05-26: 5000 [IU] via SUBCUTANEOUS
  Filled 2020-05-26: qty 1

## 2020-05-26 MED ORDER — BUPIVACAINE-EPINEPHRINE 0.25% -1:200000 IJ SOLN
INTRAMUSCULAR | Status: DC | PRN
  Administered 2020-05-26: 30 mL

## 2020-05-26 MED ORDER — MEPERIDINE HCL 50 MG/ML IJ SOLN: 6.2500 mg | INTRAMUSCULAR | Status: DC | PRN

## 2020-05-26 MED ORDER — SUCCINYLCHOLINE CHLORIDE 200 MG/10ML IV SOSY
PREFILLED_SYRINGE | INTRAVENOUS | Status: DC | PRN
  Administered 2020-05-26: 140 mg via INTRAVENOUS

## 2020-05-26 MED ORDER — PROMETHAZINE HCL 25 MG/ML IJ SOLN
12.5000 mg | Freq: Four times a day (QID) | INTRAMUSCULAR | Status: DC | PRN
  Administered 2020-05-26: 12.5 mg via INTRAVENOUS
  Filled 2020-05-26: qty 1

## 2020-05-26 MED ORDER — OXYCODONE HCL 5 MG/5ML PO SOLN: 5.0000 mg | Freq: Once | ORAL | Status: DC | PRN

## 2020-05-26 MED ORDER — ONDANSETRON HCL 4 MG/2ML IJ SOLN
INTRAMUSCULAR | Status: AC
  Filled 2020-05-26: qty 2

## 2020-05-26 SURGICAL SUPPLY — 75 items
APPLICATOR COTTON TIP 6 STRL (MISCELLANEOUS) IMPLANT
APPLICATOR COTTON TIP 6IN STRL (MISCELLANEOUS)
APPLIER CLIP ROT 10 11.4 M/L (STAPLE)
APPLIER CLIP ROT 13.4 12 LRG (CLIP) ×4
BENZOIN TINCTURE PRP APPL 2/3 (GAUZE/BANDAGES/DRESSINGS) ×4 IMPLANT
BLADE SURG SZ11 CARB STEEL (BLADE) ×4 IMPLANT
BNDG ADH 1X3 SHEER STRL LF (GAUZE/BANDAGES/DRESSINGS) ×24 IMPLANT
CABLE HIGH FREQUENCY MONO STRZ (ELECTRODE) ×4 IMPLANT
CHLORAPREP W/TINT 26 (MISCELLANEOUS) ×8 IMPLANT
CLIP APPLIE ROT 10 11.4 M/L (STAPLE) IMPLANT
CLIP APPLIE ROT 13.4 12 LRG (CLIP) ×2 IMPLANT
CLOSURE STERI-STRIP 1/2X4 (GAUZE/BANDAGES/DRESSINGS) ×1
CLOSURE WOUND 1/2 X4 (GAUZE/BANDAGES/DRESSINGS) ×1
CLSR STERI-STRIP ANTIMIC 1/2X4 (GAUZE/BANDAGES/DRESSINGS) ×3 IMPLANT
COVER SURGICAL LIGHT HANDLE (MISCELLANEOUS) ×4 IMPLANT
COVER WAND RF STERILE (DRAPES) IMPLANT
DECANTER SPIKE VIAL GLASS SM (MISCELLANEOUS) ×4 IMPLANT
DEVICE SUT QUICK LOAD TK 5 (STAPLE) IMPLANT
DEVICE SUT TI-KNOT TK 5X26 (MISCELLANEOUS) IMPLANT
DEVICE SUTURE ENDOST 10MM (ENDOMECHANICALS) IMPLANT
DEVICE TI KNOT TK5 (MISCELLANEOUS)
DISSECTOR BLUNT TIP ENDO 5MM (MISCELLANEOUS) IMPLANT
DRAPE UTILITY XL STRL (DRAPES) ×8 IMPLANT
DRSG TEGADERM 2-3/8X2-3/4 SM (GAUZE/BANDAGES/DRESSINGS) ×24 IMPLANT
ELECT L-HOOK LAP 45CM DISP (ELECTROSURGICAL)
ELECT REM PT RETURN 15FT ADLT (MISCELLANEOUS) ×4 IMPLANT
ELECTRODE L-HOOK LAP 45CM DISP (ELECTROSURGICAL) IMPLANT
GAUZE SPONGE 2X2 8PLY STRL LF (GAUZE/BANDAGES/DRESSINGS) ×4 IMPLANT
GAUZE SPONGE 4X4 12PLY STRL (GAUZE/BANDAGES/DRESSINGS) IMPLANT
GLOVE BIO SURGEON STRL SZ7.5 (GLOVE) ×4 IMPLANT
GLOVE INDICATOR 8.0 STRL GRN (GLOVE) ×4 IMPLANT
GOWN STRL REUS W/TWL XL LVL3 (GOWN DISPOSABLE) ×12 IMPLANT
GRASPER SUT TROCAR 14GX15 (MISCELLANEOUS) ×4 IMPLANT
HOVERMATT SINGLE USE (MISCELLANEOUS) ×4 IMPLANT
KIT BASIN (CUSTOM PROCEDURE TRAY) ×4 IMPLANT
KIT TURNOVER KIT A (KITS) IMPLANT
MARKER SKIN DUAL TIP RULER LAB (MISCELLANEOUS) ×4 IMPLANT
NEEDLE SPNL 22GX3.5 QUINCKE BK (NEEDLE) ×4 IMPLANT
PACK UNIVERSAL I (CUSTOM PROCEDURE TRAY) ×4 IMPLANT
PENCIL SMOKE EVACUATOR (MISCELLANEOUS) IMPLANT
QUICK LOAD TK 5 (STAPLE)
RELOAD STAPLER 60MM BLK (STAPLE) IMPLANT
RELOAD STAPLER BLUE 60MM (STAPLE) ×10 IMPLANT
RELOAD STAPLER GOLD 60MM (STAPLE) IMPLANT
RELOAD STAPLER GREEN 60MM (STAPLE) ×2 IMPLANT
SCISSORS LAP 5X45 EPIX DISP (ENDOMECHANICALS) IMPLANT
SEALANT SURGICAL APPL DUAL CAN (MISCELLANEOUS) IMPLANT
SET IRRIG TUBING LAPAROSCOPIC (IRRIGATION / IRRIGATOR) ×4 IMPLANT
SET TUBE SMOKE EVAC HIGH FLOW (TUBING) ×4 IMPLANT
SHEARS HARMONIC ACE PLUS 45CM (MISCELLANEOUS) ×4 IMPLANT
SLEEVE GASTRECTOMY 40FR VISIGI (MISCELLANEOUS) ×4 IMPLANT
SLEEVE XCEL OPT CAN 5 100 (ENDOMECHANICALS) ×12 IMPLANT
SOL ANTI FOG 6CC (MISCELLANEOUS) ×2 IMPLANT
SOLUTION ANTI FOG 6CC (MISCELLANEOUS) ×2
SPONGE GAUZE 2X2 STER 10/PKG (GAUZE/BANDAGES/DRESSINGS) ×4
SPONGE LAP 18X18 RF (DISPOSABLE) ×4 IMPLANT
STAPLER ECHELON BIOABSB 60 FLE (MISCELLANEOUS) ×28 IMPLANT
STAPLER ECHELON LONG 60 440 (INSTRUMENTS) ×4 IMPLANT
STAPLER RELOAD 60MM BLK (STAPLE)
STAPLER RELOAD BLUE 60MM (STAPLE) ×20
STAPLER RELOAD GOLD 60MM (STAPLE)
STAPLER RELOAD GREEN 60MM (STAPLE) ×4
STRIP CLOSURE SKIN 1/2X4 (GAUZE/BANDAGES/DRESSINGS) ×3 IMPLANT
SUT MNCRL AB 4-0 PS2 18 (SUTURE) ×4 IMPLANT
SUT SURGIDAC NAB ES-9 0 48 120 (SUTURE) IMPLANT
SUT VICRYL 0 TIES 12 18 (SUTURE) ×4 IMPLANT
SYR 20ML LL LF (SYRINGE) ×4 IMPLANT
SYR 50ML LL SCALE MARK (SYRINGE) ×4 IMPLANT
TOWEL OR 17X26 10 PK STRL BLUE (TOWEL DISPOSABLE) ×4 IMPLANT
TOWEL OR NON WOVEN STRL DISP B (DISPOSABLE) ×4 IMPLANT
TROCAR BLADELESS 15MM (ENDOMECHANICALS) ×4 IMPLANT
TROCAR BLADELESS OPT 5 100 (ENDOMECHANICALS) ×4 IMPLANT
TUBING CONNECTING 10 (TUBING) ×6 IMPLANT
TUBING CONNECTING 10' (TUBING) ×2
TUBING ENDO SMARTCAP (MISCELLANEOUS) ×4 IMPLANT

## 2020-05-26 NOTE — Anesthesia Procedure Notes (Signed)
Procedure Name: Intubation Date/Time: 05/26/2020 7:38 AM Performed by: Niel Hummer, CRNA Pre-anesthesia Checklist: Patient identified, Emergency Drugs available, Suction available and Patient being monitored Patient Re-evaluated:Patient Re-evaluated prior to induction Oxygen Delivery Method: Circle system utilized Preoxygenation: Pre-oxygenation with 100% oxygen Induction Type: IV induction Laryngoscope Size: Glidescope and 4 Grade View: Grade I Tube type: Oral Tube size: 7.0 mm Number of attempts: 1 Airway Equipment and Method: Stylet and Video-laryngoscopy Placement Confirmation: positive ETCO2,  breath sounds checked- equal and bilateral and ETT inserted through vocal cords under direct vision Secured at: 23 cm Tube secured with: Tape Dental Injury: Teeth and Oropharynx as per pre-operative assessment  Comments: Elective glidescope used given history of difficult airway. Grade 1 view.

## 2020-05-26 NOTE — Progress Notes (Signed)
Patient c/o 10/10 pain and nausea. PRN given. Patient has ambulated 2x in hall. Patient has demonstrated incentive spirometer use. She is currently on her 3rd cup of water. Patient educated on goals for discharge. All questions answered.

## 2020-05-26 NOTE — Anesthesia Postprocedure Evaluation (Signed)
Anesthesia Post Note  Patient: Gloria Garza  Procedure(s) Performed: LAPAROSCOPIC GASTRIC SLEEVE (N/A Abdomen) Upper endoscopy (N/A )     Patient location during evaluation: PACU Anesthesia Type: General Level of consciousness: awake and alert Pain management: pain level controlled Vital Signs Assessment: post-procedure vital signs reviewed and stable Respiratory status: spontaneous breathing, nonlabored ventilation, respiratory function stable and patient connected to nasal cannula oxygen Cardiovascular status: blood pressure returned to baseline and stable Postop Assessment: no apparent nausea or vomiting Anesthetic complications: no   No complications documented.  Last Vitals:  Vitals:   05/26/20 1000 05/26/20 1015  BP: (!) 153/106 (!) 159/85  Pulse: 88 85  Resp: 15 15  Temp: 36.7 C   SpO2: 99% 94%    Last Pain:  Vitals:   05/26/20 1005  TempSrc:   PainSc: 7                  Gloria Garza

## 2020-05-26 NOTE — Interval H&P Note (Signed)
History and Physical Interval Note:  05/26/2020 7:23 AM  Gloria Garza  has presented today for surgery, with the diagnosis of PRE DIABETES.  The various methods of treatment have been discussed with the patient and family. After consideration of risks, benefits and other options for treatment, the patient has consented to  Procedure(s): LAPAROSCOPIC GASTRIC SLEEVE (N/A) ESOPHAGOGASTRODUODENOSCOPY (EGD) (N/A) as a surgical intervention.  The patient's history has been reviewed, patient examined, no change in status, stable for surgery.  I have reviewed the patient's chart and labs.  Questions were answered to the patient's satisfaction.     Greer Pickerel

## 2020-05-26 NOTE — Op Note (Signed)
Gloria Garza [id number] [birth year] 05/26/2020  Preoperative diagnosis: sleeve gastrectomy in progress  Postoperative diagnosis: Same   Procedure: Upper endoscopy   Surgeon: Catalina Antigua B. Hassell Done  M.D., FACS   Anesthesia: Gen.   Indications for procedure: This patient was undergoing a sleeve gastrectomy by Dr. Redmond Pulling. EGD to assess symmetry of sleeve and check for bleeding and leaks.Marland Kitchen    Description of procedure: The endoscopy was placed in the mouth and into the oropharynx and under endoscopic vision it was advanced to the esophagogastric junction.  The pouch was insufflated and the sleeve was cylindrical.  The staple line looked good and no bleeding or bubbles were seen.  The EG junction had good tone and was at 40 cm.  .   No bleeding or leaks were detected.  The scope was withdrawn without difficulty.     Matt B. Hassell Done, MD, FACS General, Bariatric, & Minimally Invasive Surgery [facility], [place]

## 2020-05-26 NOTE — Progress Notes (Signed)
PHARMACY CONSULT FOR:  Risk Assessment for Post-Discharge VTE Following Bariatric Surgery  Post-Discharge VTE Risk Assessment: This patient's probability of 30-day post-discharge VTE is increased due to the factors marked: N  Female   N Age >/=60 years   N BMI >/=50 kg/m2   N CHF   N Dyspnea at Rest   N Paraplegia   Y Non-gastric-band surgery  N  Operation Time >/=3 hr   N Return to OR    N Length of Stay >/= 3 d     N Hx of VTE  N Hypercoagulable condition  N Significant venous stasis   Predicted probability of 30-day post-discharge VTE: 0.16%  Other patient-specific factors to consider: none   Recommendation for Discharge: No pharmacologic prophylaxis post-discharge    Gloria Garza is a 39 y.o. female who underwent a laparoscopic sleeve gastrectomy on 05/26/20   Case start: 0758 Case end: 0908   No Known Allergies  Patient Measurements: Height: 5\' 7"  (170.2 cm) (Simultaneous filing. User may not have seen previous data.) Weight: 134.8 kg (297 lb 3.2 oz) (Simultaneous filing. User may not have seen previous data.) IBW/kg (Calculated) : 61.6 Body mass index is 46.55 kg/m.  Recent Labs    05/26/20 1004  HGB 12.9  HCT 40.1   Estimated Creatinine Clearance: 139.5 mL/min (by C-G formula based on SCr of 0.56 mg/dL).    Past Medical History:  Diagnosis Date  . Anxiety   . Bipolar disorder ([place])   . Depression   . Pneumonia   . Pre-diabetes      Medications Prior to Admission  Medication Sig Dispense Refill Last Dose  . acetaminophen (TYLENOL) 500 MG tablet Take 1,000 mg by mouth every 6 (six) hours as needed (for pain.).   Past Month at Unknown time  . doxepin (SINEQUAN) 10 MG capsule Take 1-2 capsules (10-20 mg total) by mouth at bedtime as needed. For sleep (Patient taking differently: Take 10 mg by mouth at bedtime. ) 60 capsule 1 05/25/2020 at Unknown time  . hydrOXYzine (ATARAX/VISTARIL) 25 MG tablet Take 0.5-1 tablets (12.5-25 mg total) by mouth 3 (three)  times daily as needed. Severe anxiety and irritability (Patient taking differently: Take 12.5-25 mg by mouth 3 (three) times daily as needed (Severe anxiety and irritability). ) 90 tablet 1 05/25/2020 at Unknown time  . Misc Natural Products (APPLE CIDER VINEGAR DIET PO) Take 2 tablets by mouth in the morning and at bedtime. Gummy   05/25/2020 at Unknown time  . naproxen sodium (ALEVE) 220 MG tablet Take 220 mg by mouth.   05/23/2020       Efraim Kaufmann, PharmD, BCPS 05/26/2020,11:16 AM

## 2020-05-26 NOTE — Transfer of Care (Signed)
Immediate Anesthesia Transfer of Care Note  Patient: Gloria Garza  Procedure(s) Performed: LAPAROSCOPIC GASTRIC SLEEVE (N/A Abdomen) Upper endoscopy (N/A )  Patient Location: PACU  Anesthesia Type:General  Level of Consciousness: awake, alert  and oriented  Airway & Oxygen Therapy: Patient Spontanous Breathing and Patient connected to face mask oxygen  Post-op Assessment: Report given to RN, Post -op Vital signs reviewed and stable and Patient moving all extremities X 4  Post vital signs: Reviewed and stable  Last Vitals:  Vitals Value Taken Time  BP 159/100 05/26/20 0917  Temp    Pulse    Resp 14 05/26/20 0918  SpO2    Vitals shown include unvalidated device data.  Last Pain:  Vitals:   05/26/20 0627  TempSrc: Oral  PainSc: 0-No pain         Complications: No complications documented.

## 2020-05-26 NOTE — Progress Notes (Addendum)
Discussed post op day goals with patient including ambulation, IS, diet progression, pain, and nausea control.  BSTOP education provided including BSTOP information guide, "Guide for Pain Management after your Bariatric Procedure".  Questions answered.  Pt stated that she ambulated from the stretcher to the chair when she arrived from PACU.

## 2020-05-26 NOTE — Op Note (Signed)
05/26/2020 Gloria Garza [birth year] [id number]   PRE-OPERATIVE DIAGNOSIS:     Severe obesity (BMI 47)   Prediabetes   Hypertriglyceridemia   Family history of heart disease Food related GERD  POST-OPERATIVE DIAGNOSIS:  same  PROCEDURE:  Procedure(s): LAPAROSCOPIC SLEEVE GASTRECTOMY  UPPER GI ENDOSCOPY  SURGEON:  Surgeon(s): Gayland Curry, MD FACS FASMBS  ASSISTANTS: Johnathan Hausen MD FACS  ANESTHESIA:   general  DRAINS: none   BOUGIE: 74 fr ViSiGi  LOCAL MEDICATIONS USED:   Exparel  EBL: minimal  SPECIMEN:  Source of Specimen:  Greater curvature of stomach  DISPOSITION OF SPECIMEN:  PATHOLOGY  COUNTS:  YES  INDICATION FOR PROCEDURE: This is a very pleasant 39 y.o.-year-old morbidly obese female who has had unsuccessful attempts for sustained weight loss. The patient presents today for a planned laparoscopic sleeve gastrectomy with upper endoscopy. We have discussed the risk and benefits of the procedure extensively preoperatively. Please see my separate notes.  PROCEDURE: After obtaining informed consent and receiving 5000 units of subcutaneous heparin, the patient was brought to the operating room at [facility] and placed supine on the operating room table. General endotracheal anesthesia was established. Sequential compression devices were placed. A orogastric tube was placed. The patient's abdomen was prepped and draped in the usual standard surgical fashion. The patient received preoperative IV antibiotic. A surgical timeout was performed. ERAS protocol used.   Access to the abdomen was achieved using a 5 mm 0 laparoscope thru a 5 mm trocar In the left upper Quadrant 2 fingerbreadths below the left subcostal margin using the Optiview technique. Pneumoperitoneum was smoothly established up to 15 mm of mercury. The laparoscope was advanced and the abdominal cavity was surveilled. No evidence of injury to surrounding structures. Small fat containing umbilical hernia,  about 0.5cm, left alone. The patient was then placed in reverse Trendelenburg.   A 5 mm trocar was placed slightly above and to the left of the umbilicus under direct visualization.  The [facility] liver retractor was placed under the left lobe of the liver through a 5 mm trocar incision site in the subxiphoid position. A 5 mm trocar was placed in the lateral right upper quadrant along with a 15 mm trocar in the mid right abdomen. A final 5 mm trocar was placed in the lateral LUQ.  All under direct visualization after exparel had been infiltrated in bilateral lateral upper abdominal walls as a TAP block.  The stomach was inspected. It was completely decompressed and the orogastric tube was removed.  There was no anterior dimple that was obviously visible. Her preop ugi showed no hiatal hernia.  We identified the pylorus and measured 6 cm proximal to the pylorus and identified an area of where we would start taking down the short gastric vessels. Harmonic scalpel was used to take down the short gastric vessels along the greater curvature of the stomach. We were able to enter the lesser sac. We continued to march along the greater curvature of the stomach taking down the short gastrics. As we approached the gastrosplenic ligament we took care in this area not to injure the spleen. We were able to take down the entire gastrosplenic ligament. We then mobilized the fundus away from the left crus of diaphragm. There were not any significant posterior gastric avascular attachments. This left the stomach completely mobilized. No vessels had been taken down along the lesser curvature of the stomach.  We then reidentified the pylorus. A 40Fr ViSiGi was then placed in the  oropharynx and advanced down into the stomach and placed in the distal antrum and positioned along the lesser curvature. It was placed under suction which secured the 40Fr ViSiGi in place along the lesser curve. Then using the Ethicon echelon 60 mm  stapler with a green load with Seamguard, I placed a stapler along the antrum approximately 5 cm from the pylorus. The stapler was angled so that there is ample room at the angularis incisura. I then fired the first staple load after inspecting it posteriorly to ensure adequate space both anteriorly and posteriorly. There was a little bit of bleeding at the very beginning of the staple line so 3 clips were placed for hemostasis. At this point I still was not completely past the angularis so with a blue load with Seamguard, I placed the stapler in position just inside the prior stapleline. We then rotated the stomach to insure that there was adequate anteriorly as well as posteriorly. The stapler was then fired.  At this point I continued using 60 mm blue load staple cartridges with Seamguard. The echelon stapler was then repositioned with a 60 mm blue load with Seamguard and we continued to march up along the [place]. My assistant was holding traction along the greater curvature stomach along the cauterized short gastric vessels ensuring that the stomach was symmetrically retracted. Prior to each firing of the staple, we rotated the stomach to ensure that there is adequate stomach left.  As we approached the fundus, I used 60 mm blue cartridge with Seamguard aiming  lateral to the GE junction after mobilizing some of the esophageal fat pad.  The sleeve was inspected. There is no evidence of cork screw. The staple line appeared hemostatic. The CRNA inflated the ViSiGi to the green zone and the upper abdomen was flooded with saline. There were no bubbles. The sleeve was decompressed and the ViSiGi removed. My assistant scrubbed out and performed an upper endoscopy. The sleeve easily distended with air and the scope was easily advanced to the pylorus. There is no evidence of internal bleeding or cork screwing. There was no narrowing at the angularis. There is no evidence of bubbles. Please see his operative note for  further details. The gastric sleeve was decompressed and the endoscope was removed.  The greater curvature the stomach was grasped with a laparoscopic grasper and removed from the 15 mm trocar site.  The liver retractor was removed. I then closed the 15 mm trocar site with 1 interrupted 0 Vicryl sutures through the fascia using the endoclose. The closure was viewed laparoscopically and it was airtight. Remaining Exparel was then infiltrated in the preperitoneal spaces around the trocar sites. Pneumoperitoneum was released. All trocar sites were closed with a 4-0 Monocryl in a subcuticular fashion followed by the application of benzoin, steri-strips, and bandaids. The patient was extubated and taken to the recovery room in stable condition. All needle, instrument, and sponge counts were correct x2. There are no immediate complications  (1) 60 mm green with Seamguard (0) 60 mm gold with seamguard (6) 60 mm blue with seamguard  PLAN OF CARE: Admit to inpatient   PATIENT DISPOSITION:  PACU - hemodynamically stable.   Delay start of Pharmacological VTE agent (>24hrs) due to surgical blood loss or risk of bleeding:  no  Leighton Ruff. Redmond Pulling, MD, FACS FASMBS General, Bariatric, & Minimally Invasive Surgery [facility], [place]

## 2020-05-26 NOTE — Progress Notes (Signed)
Patient has started protein. 

## 2020-05-26 NOTE — Progress Notes (Signed)
Patient has started water.

## 2020-05-27 ENCOUNTER — Encounter (HOSPITAL_COMMUNITY): Payer: Self-pay | Admitting: General Surgery

## 2020-05-27 LAB — CBC WITH DIFFERENTIAL/PLATELET
Abs Immature Granulocytes: 0.04 10*3/uL (ref 0.00–0.07)
Basophils Absolute: 0 10*3/uL (ref 0.0–0.1)
Basophils Relative: 0 %
Eosinophils Absolute: 0 10*3/uL (ref 0.0–0.5)
Eosinophils Relative: 0 %
HCT: 39.8 % (ref 36.0–46.0)
Hemoglobin: 12.8 g/dL (ref 12.0–15.0)
Immature Granulocytes: 0 %
Lymphocytes Relative: 15 %
Lymphs Abs: 1.5 10*3/uL (ref 0.7–4.0)
MCH: 30.3 pg (ref 26.0–34.0)
MCHC: 32.2 g/dL (ref 30.0–36.0)
MCV: 94.1 fL (ref 80.0–100.0)
Monocytes Absolute: 1 10*3/uL (ref 0.1–1.0)
Monocytes Relative: 9 %
Neutro Abs: 7.9 10*3/uL — ABNORMAL HIGH (ref 1.7–7.7)
Neutrophils Relative %: 76 %
Platelets: 195 10*3/uL (ref 150–400)
RBC: 4.23 MIL/uL (ref 3.87–5.11)
RDW: 14.9 % (ref 11.5–15.5)
WBC: 10.5 10*3/uL (ref 4.0–10.5)
nRBC: 0 % (ref 0.0–0.2)

## 2020-05-27 LAB — COMPREHENSIVE METABOLIC PANEL
ALT: 53 U/L — ABNORMAL HIGH (ref 0–44)
AST: 31 U/L (ref 15–41)
Albumin: 4 g/dL (ref 3.5–5.0)
Alkaline Phosphatase: 59 U/L (ref 38–126)
Anion gap: 13 (ref 5–15)
BUN: 7 mg/dL (ref 6–20)
CO2: 23 mmol/L (ref 22–32)
Calcium: 8.8 mg/dL — ABNORMAL LOW (ref 8.9–10.3)
Chloride: 102 mmol/L (ref 98–111)
Creatinine, Ser: 0.59 mg/dL (ref 0.44–1.00)
GFR calc Af Amer: >60 mL/min (ref >60)
GFR calc non Af Amer: >60 mL/min (ref >60)
Glucose, Bld: 151 mg/dL — ABNORMAL HIGH (ref 70–99)
Potassium: 4.2 mmol/L (ref 3.5–5.1)
Sodium: 138 mmol/L (ref 135–145)
Total Bilirubin: 0.7 mg/dL (ref 0.3–1.2)
Total Protein: 7.4 g/dL (ref 6.5–8.1)

## 2020-05-27 LAB — GLUCOSE, CAPILLARY
Glucose-Capillary: 138 mg/dL — ABNORMAL HIGH (ref 70–99)
Glucose-Capillary: 139 mg/dL — ABNORMAL HIGH (ref 70–99)
Glucose-Capillary: 156 mg/dL — ABNORMAL HIGH (ref 70–99)
Glucose-Capillary: 160 mg/dL — ABNORMAL HIGH (ref 70–99)

## 2020-05-27 MED ORDER — GABAPENTIN 100 MG PO CAPS
200.0000 mg | ORAL_CAPSULE | Freq: Two times a day (BID) | ORAL | 0 refills | Status: DC
Start: 2020-05-27 — End: 2021-02-17

## 2020-05-27 MED ORDER — TRAMADOL HCL 50 MG PO TABS: 50.0000 mg | ORAL_TABLET | Freq: Four times a day (QID) | ORAL | 0 refills | Status: AC | PRN

## 2020-05-27 MED ORDER — ONDANSETRON 4 MG PO TBDP: 4.0000 mg | ORAL_TABLET | Freq: Four times a day (QID) | ORAL | 0 refills | Status: AC | PRN

## 2020-05-27 MED ORDER — PANTOPRAZOLE SODIUM 40 MG PO TBEC
40.0000 mg | DELAYED_RELEASE_TABLET | Freq: Every day | ORAL | 0 refills | Status: AC
Start: 2020-05-27 — End: ?

## 2020-05-27 NOTE — Discharge Instructions (Signed)
° ° ° °GASTRIC BYPASS/SLEEVE ° Home Care Instructions ° ° These instructions are to help you care for yourself when you go home. ° °Call: If you have any problems. °• Call [phone] and ask for the surgeon on call °• If you need immediate help, come to the ER at [place].  °• Tell the ER staff that you are a new post-op gastric bypass or gastric sleeve patient °  °Signs and symptoms to report: • Severe vomiting or nausea °o If you cannot keep down clear liquids for longer than 1 day, call your surgeon  °• Abdominal pain that does not get better after taking your pain medication °• Fever over 100.4° F with chills °• Heart beating over 100 beats a minute °• Shortness of breath at rest °• Chest pain °•  Redness, swelling, drainage, or foul odor at incision (surgical) sites °•  If your incisions open or pull apart °• Swelling or pain in calf (lower leg) °• Diarrhea (Loose bowel movements that happen often), frequent watery, uncontrolled bowel movements °• Constipation, (no bowel movements for 3 days) if this happens: Pick one °o Milk of Magnesia, 2 tablespoons by mouth, 3 times a day for 2 days if needed °o Stop taking Milk of Magnesia once you have a bowel movement °o Call your doctor if constipation continues °Or °o Miralax  (instead of Milk of Magnesia) following the label instructions °o Stop taking Miralax once you have a bowel movement °o Call your doctor if constipation continues °• Anything you think is not normal °  °Normal side effects after surgery: • Unable to sleep at night or unable to focus °• Irritability or moody °• Being tearful (crying) or depressed °These are common complaints, possibly related to your anesthesia medications that put you to sleep, stress of surgery, and change in lifestyle.  This usually goes away a few weeks after surgery.  If these feelings continue, call your primary care doctor. °  °Wound Care: You may have surgical glue, steri-strips, or staples over your incisions after  surgery °• Surgical glue:  Looks like a clear film over your incisions and will wear off a little at a time °• Steri-strips: Strips of tape over your incisions. You may notice a yellowish color on the skin under the steri-strips. This is used to make the   steri-strips stick better. Do not pull the steri-strips off - let them fall off °• Staples: Staples may be removed before you leave the hospital °o If you go home with staples, call [facility], [phone] at for an appointment with your surgeon’s nurse to have staples removed 10 days after surgery. °• Showering: You may shower two (2) days after your surgery unless your surgeon tells you differently °o Wash gently around incisions with warm soapy water, rinse well, and gently pat dry  °o No tub baths until staples are removed, steri-strips fall off or glue is gone.  °  °Medications: • Medications should be liquid or crushed if larger than the size of a dime °• Extended release pills (medication that release a little bit at a time through the day) should NOT be crushed or cut. (examples include XL, ER, DR, SR) °• Depending on the size and number of medications you take, you may need to space (take a few throughout the day)/change the time you take your medications so that you do not over-fill your pouch (smaller stomach) °• Make sure you follow-up with your primary care doctor to   make medication changes needed during rapid weight loss and life-style changes °• If you have diabetes, follow up with the doctor that orders your diabetes medication(s) within one week after surgery and check your blood sugar regularly. °• Do not drive while taking prescription pain medication  °• It is ok to take Tylenol by the bottle instructions with your pain medicine or instead of your pain medicine as needed.  DO NOT TAKE NSAIDS (EXAMPLES OF NSAIDS:  IBUPROFREN/ NAPROXEN)  °Diet:                    First 2 Weeks ° You will see the dietician t about two (2) weeks  after your surgery. The dietician will increase the types of foods you can eat if you are handling liquids well: °• If you have severe vomiting or nausea and cannot keep down clear liquids lasting longer than 1 day, call your surgeon @ ([phone]) °Protein Shake °• Drink at least 2 ounces of shake 5-6 times per day °• Each serving of protein shakes (usually 8 - 12 ounces) should have: °o 15 grams of protein  °o And no more than 5 grams of carbohydrate  °• Goal for protein each day: °o Men = 80 grams per day °o Women = 60 grams per day °• Protein powder may be added to fluids such as non-fat milk or Lactaid milk or unsweetened Soy/Almond milk (limit to 35 grams added protein powder per serving) ° °Hydration °• Slowly increase the amount of water and other clear liquids as tolerated (See Acceptable Fluids) °• Slowly increase the amount of protein shake as tolerated  °•  Sip fluids slowly and throughout the day.  Do not use straws. °• May use sugar substitutes in small amounts (no more than 6 - 8 packets per day; i.e. Splenda) ° °Fluid Goal °• The first goal is to drink at least 8 ounces of protein shake/drink per day (or as directed by the nutritionist); some examples of protein shakes are Syntrax Nectar, Adkins Advantage, EAS Edge HP, and Unjury. See handout from pre-op Bariatric Education Class: °o Slowly increase the amount of protein shake you drink as tolerated °o You may find it easier to slowly sip shakes throughout the day °o It is important to get your proteins in first °• Your fluid goal is to drink 64 - 100 ounces of fluid daily °o It may take a few weeks to build up to this °• 32 oz (or more) should be clear liquids  °And  °• 32 oz (or more) should be full liquids (see below for examples) °• Liquids should not contain sugar, caffeine, or carbonation ° °Clear Liquids: °• Water or Sugar-free flavored water (i.e. Fruit H2O, Propel) °• Decaffeinated coffee or tea (sugar-free) °• Crystal Lite, Wyler’s Lite,  Minute Maid Lite °• Sugar-free Jell-O °• Bouillon or broth °• Sugar-free Popsicle:   *Less than 20 calories each; Limit 1 per day ° °Full Liquids: °Protein Shakes/Drinks + 2 choices per day of other full liquids °• Full liquids must be: °o No More Than 15 grams of Carbs per serving  °o No More Than 3 grams of Fat per serving °• Strained low-fat cream soup (except Cream of Potato or Tomato) °• Non-Fat milk °• Fat-free Lactaid Milk °• Unsweetened Soy Or Unsweetened Almond Milk °• Low Sugar yogurt (Dannon Lite & Fit, Greek yogurt; Oikos Triple Zero; Chobani Simply 100; Yoplait 100 calorie Greek - No Fruit on the Bottom) ° °  °Vitamins   and Minerals • Start 1 day after surgery unless otherwise directed by your surgeon °• 2 Chewable Bariatric Specific Multivitamin / Multimineral Supplement with iron (Example: Bariatric Advantage Multi EA) °• Chewable Calcium with Vitamin D-3 °(Example: 3 Chewable Calcium Plus 600 with Vitamin D-3) °o Take 500 mg three (3) times a day for a total of 1500 mg each day °o Do not take all 3 doses of calcium at one time as it may cause constipation, and you can only absorb 500 mg  at a time  °o Do not mix multivitamins containing iron with calcium supplements; take 2 hours apart °• Menstruating women and those with a history of anemia (a blood disease that causes weakness) may need extra iron °o Talk with your doctor to see if you need more iron °• Do not stop taking or change any vitamins or minerals until you talk to your dietitian or surgeon °• Your Dietitian and/or surgeon must approve all vitamin and mineral supplements °  °Activity and Exercise: Limit your physical activity as instructed by your doctor.  It is important to continue walking at home.  During this time, use these guidelines: °• Do not lift anything greater than ten (10) pounds for at least two (2) weeks °• Do not go back to work or drive until your surgeon says you can °• You may have sex when you feel comfortable  °o It is  VERY important for female patients to use a reliable birth control method; fertility often increases after surgery  °o All hormonal birth control will be ineffective for 30 days after surgery due to medications given during surgery a barrier method must be used. °o Do not get pregnant for at least 18 months °• Start exercising as soon as your doctor tells you that you can °o Make sure your doctor approves any physical activity °• Start with a simple walking program °• Walk 5-15 minutes each day, 7 days per week.  °• Slowly increase until you are walking 30-45 minutes per day °Consider joining our BELT program. [phone] or email [email or web address] °  °Special Instructions Things to remember: °• Use your CPAP when sleeping if this applies to you ° °• [facility] has two free Bariatric Surgery Support Groups that meet monthly °o The 3rd Thursday of each month, 6 pm, [facility] [employer] [facility]  °o The 2nd Friday of each month, 11:45 am in the private dining room in the basement of [place] °• It is very important to keep all follow up appointments with your surgeon, dietitian, primary care physician, and behavioral health practitioner °• Routine follow up schedule with your surgeon include appointments at 2-3 weeks, 6-8 weeks, 6 months, and 1 year at a minimum.  Your surgeon may request to see you more often.   °o After the first year, please follow up with your bariatric surgeon and dietitian at least once a year in order to maintain best weight loss results °[facility]: [phone] °[facility]: [phone] °Bariatric Nurse Coordinator: [phone] °  °   Reviewed and Endorsed  °by [facility] Patient Education Committee, June, 2016 °Edits Approved: Aug, 2018 ° ° ° °

## 2020-05-27 NOTE — Progress Notes (Signed)
Patient alert and oriented, Post op day 1.  Provided support and encouragement.  Encouraged pulmonary toilet, ambulation and small sips of liquids. Started on protein shakes yesterday and is currently on 5th cup. Pt was slightly nauseated earlier after taking meds but is "feeling better".  All questions answered.  Will continue to monitor.

## 2020-05-27 NOTE — Progress Notes (Signed)
Patient alert and oriented, pain is controlled. Patient is tolerating fluids, advanced to protein shake yesterday, patient is tolerating well.  Reviewed Gastric sleeve discharge instructions with patient and patient is able to articulate understanding.  Provided information on BELT program, Support Group and [facility] outpatient pharmacy. All questions answered, will continue to monitor. Pt is ready for discharge.  24 hr po intake=[id number]

## 2020-05-27 NOTE — Progress Notes (Signed)
1 Day Post-Op   Subjective/Chief Complaint: Not much pain  Had some nausea yesterday after surgery and again this am Has done water. Did some shake last pm Had some broth this am and then got nauseous   Objective: Vital signs in last 24 hours: Temp:  [97.4 F (36.3 C)-98.6 F (37 C)] 97.9 F (36.6 C) (07/07 7793) Pulse Rate:  [79-91] 86 (07/07 0632) Resp:  [11-19] 19 (07/07 0632) BP: (134-181)/(72-106) 166/98 (07/07 9030) SpO2:  [94 %-100 %] 98 % (07/07 0632) Last BM Date: 05/25/20  Intake/Output from previous day: 07/06 0701 - 07/07 0700 In: 3805.4 [P.O.:720; I.V.:2985.4; IV Piggyback:100] Out: 0  Intake/Output this shift: No intake/output data recorded.  Alert, nad symm chest rise Reg Soft, approp mild TTP, incisions ok No edema  Lab Results:  Recent Labs    05/26/20 1004 05/27/20 0429  WBC  --  10.5  HGB 12.9 12.8  HCT 40.1 39.8  PLT  --  195   BMET Recent Labs    05/27/20 0429  NA 138  K 4.2  CL 102  CO2 23  GLUCOSE 151*  BUN 7  CREATININE 0.59  CALCIUM 8.8*   PT/INR No results for input(s): LABPROT, INR in the last 72 hours. ABG No results for input(s): PHART, HCO3 in the last 72 hours.  Invalid input(s): PCO2, PO2  Studies/Results: No results found.  Anti-infectives: Anti-infectives (From admission, onward)   Start     Dose/Rate Route Frequency Ordered Stop   05/26/20 0600  cefoTEtan (CEFOTAN) 2 g in sodium chloride 0.9 % 100 mL IVPB        2 g 200 mL/hr over 30 Minutes Intravenous On call to O.R. 05/26/20 0530 05/26/20 1624      Assessment/Plan: s/p Procedure(s): LAPAROSCOPIC GASTRIC SLEEVE (N/A) Upper endoscopy (N/A)  Doing ok No fever. No tachycardia. Wbc ok Cont diet as tolerated Cont chemical vte prophylaxis Will see how progresses this am  LOS: 1 day    Greer Pickerel 05/27/2020

## 2020-05-27 NOTE — Progress Notes (Signed)
Nutrition Education Note  Requested by Bariatric nurse coordinator to review diet information with patient prior to discharge.   Discussed 2 week post op diet with pt. Emphasized that liquids must be non carbonated, non caffeinated, and sugar free. Fluid goals discussed. Pt to follow up with outpatient bariatric RD for further diet progression after 2 weeks. Multivitamins and minerals also reviewed. Teach back method used, pt expressed understanding, expect good compliance.   Diet: First 2 Weeks  You will see the dietitian about two (2) weeks after your surgery. The dietitian will increase the types of foods you can eat if you are handling liquids well:  If you have severe vomiting or nausea and cannot handle clear liquids lasting longer than 1 day, call your surgeon  Protein Shake  Drink at least 2 ounces of shake 5-6 times per day  Each serving of protein shakes (usually 8 - 12 ounces) should have a minimum of:  15 grams of protein  And no more than 5 grams of carbohydrate  Goal for protein each day:  Men = 80 grams per day  Women = 60 grams per day  Protein powder may be added to fluids such as non-fat milk or Lactaid milk or Soy milk (limit to 35 grams added protein powder per serving)   Hydration  Slowly increase the amount of water and other clear liquids as tolerated (See Acceptable Fluids)  Slowly increase the amount of protein shake as tolerated  Sip fluids slowly and throughout the day  May use sugar substitutes in small amounts (no more than 6 - 8 packets per day; i.e. Splenda)   Fluid Goal  The first goal is to drink at least 8 ounces of protein shake/drink per day (or as directed by the nutritionist); some examples of protein shakes are Premier Protein, Johnson [employer] Johnson, AMR Corporation, EAS Edge HP, and Unjury. See handout from pre-op Bariatric Education Class:  Slowly increase the amount of protein shake you drink as tolerated  You may find it easier to slowly sip shakes  throughout the day  It is important to get your proteins in first  Your fluid goal is to drink 64 - 100 ounces of fluid daily  It may take a few weeks to build up to this  32 oz (or more) should be clear liquids  And  32 oz (or more) should be full liquids (see below for examples)  Liquids should not contain sugar, caffeine, or carbonation   Clear Liquids:  Water or Sugar-free flavored water (i.e. Fruit H2O, Propel)  Decaffeinated coffee or tea (sugar-free)  Crystal Lite, Wyler's Lite, Minute Maid Lite  Sugar-free Jell-O  Bouillon or broth  Sugar-free Popsicle: *Less than 20 calories each; Limit 1 per day   Full Liquids:  Protein Shakes/Drinks + 2 choices per day of other full liquids  Full liquids must be:  No More Than 12 grams of Carbs per serving  No More Than 3 grams of Fat per serving  Strained low-fat cream soup  Non-Fat milk  Fat-free Lactaid Milk  Sugar-free yogurt (Dannon Lite & Fit, [place] yogurt, Oikos Zero)   Clayton Bibles, MS, RD, LDN Inpatient Clinical Dietitian Contact information available via Amion

## 2020-05-28 NOTE — Discharge Summary (Signed)
Physician Discharge Summary  Lockie Basque MRN:[id number] DOB: 08/30/1981 DOA: 05/26/2020  PCP: Scarboro, Adam J, NP  Admit date: 05/26/2020 Discharge date: 05/27/2020  Recommendations for Outpatient Follow-up:    Follow-up Information    Wilson, Eric, MD Follow up on 06/19/2020.   Specialty: General Surgery Why: @ 10:15 am w/ Dr. Wilson Contact information: [address] [place] [address] [phone]        Wilson, Eric, MD .   Specialty: General Surgery Contact information: [address] [place] [address] [phone]              Discharge Diagnoses:  Principal Problem:   Severe obesity (BMI >= 40) (HCC) Active Problems:   Prediabetes   Hypertriglyceridemia   Family history of heart disease   Surgical Procedure: Laparoscopic Sleeve Gastrectomy, upper endoscopy  Discharge Condition: Good Disposition: Home  Diet recommendation: Postoperative sleeve gastrectomy diet (liquids only)  Filed Weights   05/26/20 0627  Weight: 134.8 kg     Hospital Course:  The patient was admitted for a planned laparoscopic sleeve gastrectomy. Please see operative note. Preoperatively the patient was given 5000 units of subcutaneous heparin for DVT prophylaxis. Postoperative prophylactic Lovenox dosing was started on the evening of postoperative day 0. ERAS protocol was used. On the evening of postoperative day 0, the patient was started on water and ice chips. On postoperative day 1 the patient had no fever or tachycardia and was tolerating water in their diet was gradually advanced throughout the day. The patient was ambulating without difficulty. Their vital signs are stable without fever or tachycardia. Their hemoglobin had remained stable. . The patient had received discharge instructions and counseling. They were deemed stable for discharge and had met discharge criteria   Discharge Instructions  Discharge Instructions    Ambulate hourly while awake    Complete by: As directed    Call MD for:  difficulty breathing, headache or visual disturbances   Complete by: As directed    Call MD for:  persistant dizziness or light-headedness   Complete by: As directed    Call MD for:  persistant nausea and vomiting   Complete by: As directed    Call MD for:  redness, tenderness, or signs of infection (pain, swelling, redness, odor or green/yellow discharge around incision site)   Complete by: As directed    Call MD for:  severe uncontrolled pain   Complete by: As directed    Call MD for:  temperature >101 F   Complete by: As directed    Diet bariatric full liquid   Complete by: As directed    Discharge instructions   Complete by: As directed    See bariatric discharge instructions   Incentive spirometry   Complete by: As directed    Perform hourly while awake     Allergies as of 05/27/2020   No Known Allergies     Medication List    STOP taking these medications   naproxen sodium 220 MG tablet Commonly known as: ALEVE     TAKE these medications   acetaminophen 500 MG tablet Commonly known as: TYLENOL Take 1,000 mg by mouth every 6 (six) hours as needed (for pain.).   APPLE CIDER VINEGAR DIET PO Take 2 tablets by mouth in the morning and at bedtime. Gummy   divalproex 500 MG 24 hr tablet Commonly known as: DEPAKOTE ER TAKE 3 TABLETS(1500 MG) BY MOUTH DAILY What changed: See the new instructions.     doxepin 10 MG capsule Commonly known as: SINEQUAN Take 1-2 capsules (10-20 mg total) by mouth at bedtime as needed. For sleep What changed:   how much to take  when to take this  additional instructions   gabapentin 100 MG capsule Commonly known as: NEURONTIN Take 2 capsules (200 mg total) by mouth every 12 (twelve) hours.   hydrOXYzine 25 MG tablet Commonly known as: ATARAX/VISTARIL Take 0.5-1 tablets (12.5-25 mg total) by mouth 3 (three) times daily as needed. Severe anxiety and irritability What changed:   reasons  to take this  additional instructions   ondansetron 4 MG disintegrating tablet Commonly known as: ZOFRAN-ODT Take 1 tablet (4 mg total) by mouth every 6 (six) hours as needed for nausea or vomiting.   pantoprazole 40 MG tablet Commonly known as: PROTONIX Take 1 tablet (40 mg total) by mouth daily.   traMADol 50 MG tablet Commonly known as: ULTRAM Take 1 tablet (50 mg total) by mouth every 6 (six) hours as needed (pain).   traZODone 100 MG tablet Commonly known as: DESYREL TAKE 1 TO 2 TABLETS(100 TO 200 MG) BY MOUTH AT BEDTIME FOR SLEEP What changed: See the new instructions.       Follow-up Information    Wilson, Eric, MD Follow up on 06/19/2020.   Specialty: General Surgery Why: @ 10:15 am w/ Dr. Wilson Contact information: [address] [place] [address] [phone]        Wilson, Eric, MD .   Specialty: General Surgery Contact information: [address] [place] [address] [phone]                The results of significant diagnostics from this hospitalization (including imaging, microbiology, ancillary and laboratory) are listed below for reference.    Significant Diagnostic Studies: No results found.  Labs: Basic Metabolic Panel: Recent Labs  Lab 05/27/20 0429  NA 138  K 4.2  CL 102  CO2 23  GLUCOSE 151*  BUN 7  CREATININE 0.59  CALCIUM 8.8*   Liver Function Tests: Recent Labs  Lab 05/27/20 0429  AST 31  ALT 53*  ALKPHOS 59  BILITOT 0.7  PROT 7.4  ALBUMIN 4.0    CBC: Recent Labs  Lab 05/26/20 1004 05/27/20 0429  WBC  --  10.5  NEUTROABS  --  7.9*  HGB 12.9 12.8  HCT 40.1 39.8  MCV  --  94.1  PLT  --  195    CBG: Recent Labs  Lab 05/26/20 2013 05/27/20 0006 05/27/20 0322 05/27/20 0749 05/27/20 1156  GLUCAP 167* 139* 138* 156* 160*    Principal Problem:   Severe obesity (BMI >= 40) (HCC) Active Problems:   Prediabetes   Hypertriglyceridemia   Family history of heart  disease   Time coordinating discharge: 15 min  Signed:  Eric M Wilson, MD FACS [facility] [phone] 05/28/2020, 4:48 PM  

## 2020-06-01 ENCOUNTER — Telehealth (HOSPITAL_COMMUNITY): Payer: Self-pay

## 2020-06-01 NOTE — Telephone Encounter (Signed)
Patient called to discuss post bariatric surgery follow up questions.  See below:   1.  Tell me about your pain and pain management?right side   2.  Let's talk about fluid intake.  How much total fluid are you taking in?51 ounces  3.  How much protein have you taken in the last 2 days?30 grams  4.  Have you had nausea?  Tell me about when have experienced nausea and what you did to help?denies  5.  Has the frequency or color changed with your urine?frequent and light in color  6.  Tell me what your incisions look like?no problems  7.  Have you been passing gas? BM?passing gas had bm  8.  If a problem or question were to arise who would you call?  Do you know contact numbers for [facility], [facility], and [facility]?provided phone numbers  9.  How has the walking going?walking regularly no issues  10.  How are your vitamins and calcium going?  How are you taking them?began day of discharge.  Taking mvi twice a day reduced to one time per day due to instructions per product and 3 calciums  Patient tearful discussing how difficult it is not to eat what family is haivng "mourning" food.  Patient is reaching out to counselor for appointment.  Some complaints of gas pain in chest, instructed to call office if unchanged.

## 2020-06-02 ENCOUNTER — Telehealth: Payer: Self-pay

## 2020-06-02 NOTE — Telephone Encounter (Signed)
Confirmed appointment on 06/04/2020 and screened for covid. klh

## 2020-06-02 NOTE — Telephone Encounter (Signed)
CONFIRMED PATIENT APPT. -AR °

## 2020-06-04 ENCOUNTER — Other Ambulatory Visit: Payer: Self-pay

## 2020-06-04 ENCOUNTER — Ambulatory Visit (INDEPENDENT_AMBULATORY_CARE_PROVIDER_SITE_OTHER): Admitting: Adult Health

## 2020-06-04 ENCOUNTER — Encounter: Payer: Self-pay | Admitting: Adult Health

## 2020-06-04 VITALS — BP 143/84 | HR 80 | Temp 97.4°F | Resp 16 | Ht 67.0 in | Wt 283.0 lb

## 2020-06-04 DIAGNOSIS — E781 Pure hyperglyceridemia: Secondary | ICD-10-CM | POA: Diagnosis not present

## 2020-06-04 DIAGNOSIS — Z6841 Body Mass Index (BMI) 40.0 and over, adult: Secondary | ICD-10-CM

## 2020-06-04 DIAGNOSIS — F339 Major depressive disorder, recurrent, unspecified: Secondary | ICD-10-CM

## 2020-06-04 DIAGNOSIS — E66813 Obesity, class 3: Secondary | ICD-10-CM

## 2020-06-04 NOTE — Progress Notes (Signed)
[facility] [place], [place] [address]  Internal MEDICINE  Office Visit Note  Patient Name: Gloria Garza  [id number]  [id number]  Date of Service: 06/04/2020  Chief Complaint  Patient presents with  . Follow-up  . Depression  . Quality Metric Gaps    covid,tetnaus    HPI  Pt is here for follow up.  Two weeks ago she had Gasatric bypass surgery and she is doing well with that.  She has lost 20 pounds, and is feeling great.  Her depression is currently well controlled.    Current Medication: Outpatient Encounter Medications as of 06/04/2020  Medication Sig  . acetaminophen (TYLENOL) 500 MG tablet Take 1,000 mg by mouth every 6 (six) hours as needed (for pain.).  Marland Kitchen divalproex (DEPAKOTE ER) 500 MG 24 hr tablet TAKE 3 TABLETS(1500 MG) BY MOUTH DAILY  . doxepin (SINEQUAN) 10 MG capsule Take 1-2 capsules (10-20 mg total) by mouth at bedtime as needed. For sleep (Patient taking differently: Take 10 mg by mouth at bedtime. )  . gabapentin (NEURONTIN) 100 MG capsule Take 2 capsules (200 mg total) by mouth every 12 (twelve) hours.  . hydrOXYzine (ATARAX/VISTARIL) 25 MG tablet Take 0.5-1 tablets (12.5-25 mg total) by mouth 3 (three) times daily as needed. Severe anxiety and irritability (Patient taking differently: Take 12.5-25 mg by mouth 3 (three) times daily as needed (Severe anxiety and irritability). )  . Misc Natural Products (APPLE CIDER VINEGAR DIET PO) Take 2 tablets by mouth in the morning and at bedtime. Gummy  . ondansetron (ZOFRAN-ODT) 4 MG disintegrating tablet Take 1 tablet (4 mg total) by mouth every 6 (six) hours as needed for nausea or vomiting.  . pantoprazole (PROTONIX) 40 MG tablet Take 1 tablet (40 mg total) by mouth daily.  . traMADol (ULTRAM) 50 MG tablet Take 1 tablet (50 mg total) by mouth every 6 (six) hours as needed (pain).  . traZODone (DESYREL) 100 MG tablet TAKE 1 TO 2 TABLETS(100 TO 200 MG) BY MOUTH AT BEDTIME FOR SLEEP   No  facility-administered encounter medications on file as of 06/04/2020.    Surgical History: Past Surgical History:  Procedure Laterality Date  . CESAREAN SECTION     x2  . CYSTOSCOPY  09/26/2019   Procedure: CYSTOSCOPY;  Surgeon: Gae Dry, MD;  Location: [facility] ORS;  Service: Gynecology;;  . ESOPHAGOGASTRODUODENOSCOPY N/A 05/26/2020   Procedure: Upper endoscopy;  Surgeon: Greer Pickerel, MD;  Location: [facility]S;  Service: General;  Laterality: N/A;  . LAPAROSCOPIC GASTRIC SLEEVE RESECTION N/A 05/26/2020   Procedure: LAPAROSCOPIC GASTRIC SLEEVE;  Surgeon: Greer Pickerel, MD;  Location: [facility];  Service: General;  Laterality: N/A;  . LAPAROSCOPIC HYSTERECTOMY N/A 09/26/2019   Procedure: HYSTERECTOMY TOTAL LAPAROSCOPIC;  Surgeon: Gae Dry, MD;  Location: [facility] ORS;  Service: Gynecology;  Laterality: N/A;  . TUBAL LIGATION      Medical History: Past Medical History:  Diagnosis Date  . Anxiety   . Bipolar disorder ([place])   . Depression   . Pneumonia   . Pre-diabetes     Family History: Family History  Problem Relation Age of Onset  . Diabetes Maternal Grandmother   . Cancer - Colon Maternal Grandmother   . Diabetes Paternal Grandmother   . Heart disease Paternal Grandmother   . Alcohol abuse Brother   . Drug abuse Brother     Social History   Socioeconomic History  . Marital status: Married    Spouse name: terah  .  Number of children: 2  . Years of education: Not on file  . Highest education level: Associate degree: occupational, Hotel manager, or vocational program  Occupational History  . Not on file  Tobacco Use  . Smoking status: Former Smoker    Years: 5.00    Types: Cigarettes    Quit date: 07/18/2018    Years since quitting: 1.8  . Smokeless tobacco: Never Used  Vaping Use  . Vaping Use: Never used  Substance and Sexual Activity  . Alcohol use: Not Currently    Comment: rarely  . Drug use: Never  . Sexual activity: Yes    Birth control/protection: Surgical   Other Topics Concern  . Not on file  Social History Narrative  . Not on file   Social Determinants of Health   Financial Resource Strain:   . Difficulty of Paying Living Expenses:   Food Insecurity:   . Worried About Charity fundraiser in the Last Year:   . Arboriculturist in the Last Year:   Transportation Needs:   . Film/video editor (Medical):   Marland Kitchen Lack of Transportation (Non-Medical):   Physical Activity:   . Days of Exercise per Week:   . Minutes of Exercise per Session:   Stress:   . Feeling of Stress :   Social Connections:   . Frequency of Communication with Friends and Family:   . Frequency of Social Gatherings with Friends and Family:   . Attends Religious Services:   . Active Member of Clubs or Organizations:   . Attends Archivist Meetings:   Marland Kitchen Marital Status:   Intimate Partner Violence:   . Fear of Current or Ex-Partner:   . Emotionally Abused:   Marland Kitchen Physically Abused:   . Sexually Abused:       Review of Systems  Constitutional: Negative for chills, fatigue and unexpected weight change.  HENT: Negative for congestion, rhinorrhea, sneezing and sore throat.   Eyes: Negative for photophobia, pain and redness.  Respiratory: Negative for cough, chest tightness and shortness of breath.   Cardiovascular: Negative for chest pain and palpitations.  Gastrointestinal: Negative for abdominal pain, constipation, diarrhea, nausea and vomiting.  Endocrine: Negative.   Genitourinary: Negative for dysuria and frequency.  Musculoskeletal: Negative for arthralgias, back pain, joint swelling and neck pain.  Skin: Negative for rash.  Allergic/Immunologic: Negative.   Neurological: Negative for tremors and numbness.  Hematological: Negative for adenopathy. Does not bruise/bleed easily.  Psychiatric/Behavioral: Negative for behavioral problems and sleep disturbance. The patient is not nervous/anxious.     Vital Signs: BP (!) 143/84   Pulse 80   Temp (!)  97.4 F (36.3 C)   Resp 16   Ht 5\' 7"  (1.702 m)   Wt 283 lb (128.4 kg)   LMP 08/30/2019   SpO2 95%   BMI 44.32 kg/m    Physical Exam Vitals and nursing note reviewed.  Constitutional:      General: She is not in acute distress.    Appearance: She is well-developed. She is not diaphoretic.  HENT:     Head: Normocephalic and atraumatic.     Mouth/Throat:     Pharynx: No oropharyngeal exudate.  Eyes:     Pupils: Pupils are equal, round, and reactive to light.  Neck:     Thyroid: No thyromegaly.     Vascular: No JVD.     Trachea: No tracheal deviation.  Cardiovascular:     Rate and Rhythm: Normal rate and regular  rhythm.     Heart sounds: Normal heart sounds. No murmur heard.  No friction rub. No gallop.   Pulmonary:     Effort: Pulmonary effort is normal. No respiratory distress.     Breath sounds: Normal breath sounds. No wheezing or rales.  Chest:     Chest wall: No tenderness.  Abdominal:     Palpations: Abdomen is soft.     Tenderness: There is no abdominal tenderness. There is no guarding.  Musculoskeletal:        General: Normal range of motion.     Cervical back: Normal range of motion and neck supple.  Lymphadenopathy:     Cervical: No cervical adenopathy.  Skin:    General: Skin is warm and dry.  Neurological:     Mental Status: She is alert and oriented to person, place, and time.     Cranial Nerves: No cranial nerve deficit.  Psychiatric:        Behavior: Behavior normal.        Thought Content: Thought content normal.        Judgment: Judgment normal.    Assessment/Plan: 1. Depression, recurrent ([place]) Controlled at this time, continue to see psych.   2. Hypertriglyceridemia Continue to monitor.   3. Class 3 severe obesity due to excess calories with serious comorbidity and body mass index (BMI) of 40.0 to 44.9 in adult [place]) Obesity Counseling: Risk Assessment: An assessment of behavioral risk factors was made today and includes lack of exercise  sedentary lifestyle, lack of portion control and poor dietary habits.  Risk Modification Advice: She was counseled on portion control guidelines. Restricting daily caloric intake to 1600. The detrimental long term effects of obesity on her health and ongoing poor compliance was also discussed with the patient.  4. Morbid obesity ([place]) BMI over 40  General Counseling: Laysa verbalizes understanding of the findings of todays visit and agrees with plan of treatment. I have discussed any further diagnostic evaluation that may be needed or ordered today. We also reviewed her medications today. she has been encouraged to call the office with any questions or concerns that should arise related to todays visit.    No orders of the defined types were placed in this encounter.   No orders of the defined types were placed in this encounter.   Time spent: 30 Minutes   This patient was seen by Orson Gear AGNP-C in Collaboration with Dr Lavera Guise as a part of collaborative care agreement     Kendell Bane AGNP-C Internal medicine

## 2020-06-08 ENCOUNTER — Other Ambulatory Visit: Payer: Self-pay

## 2020-06-08 ENCOUNTER — Ambulatory Visit (INDEPENDENT_AMBULATORY_CARE_PROVIDER_SITE_OTHER): Admitting: Licensed Clinical Social Worker

## 2020-06-08 DIAGNOSIS — F3130 Bipolar disorder, current episode depressed, mild or moderate severity, unspecified: Secondary | ICD-10-CM

## 2020-06-08 DIAGNOSIS — F5081 Binge eating disorder: Secondary | ICD-10-CM | POA: Diagnosis not present
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
## 2020-06-09 ENCOUNTER — Other Ambulatory Visit: Payer: Self-pay

## 2020-06-09 ENCOUNTER — Encounter: Attending: General Surgery | Admitting: Skilled Nursing Facility1

## 2020-06-09 DIAGNOSIS — E669 Obesity, unspecified: Secondary | ICD-10-CM | POA: Diagnosis present

## 2020-06-10 NOTE — Progress Notes (Signed)
2 Week Post-Operative Nutrition Class   Patient was seen on 01/15/19 for Post-Operative Nutrition education at [facility]    Surgery date: 05/26/2020 Surgery type: Sleeve Gastrectomy [facility] weight at [facility]: 285.1lbs 06/11/19 Weight today: 279.6   Body Composition Scale 06/10/2020  Total Body Fat % 45.5  Visceral Fat 14  Fat-Free Mass % 54.4   Total Body Water % 41.7   Muscle-Mass lbs 35.2  Body Fat Displacement          Torso  lbs 79         Left Leg  lbs 15.8         Right Leg  lbs 15.8         Left Arm  lbs 7.9         Right Arm   lbs 7.9     The following the learning objectives were met by the patient during this course:  Identifies Phase 3 (Soft, High Proteins) Dietary Goals and will begin from 2 weeks post-operatively to 2 months post-operatively  Identifies appropriate sources of fluids and proteins   States protein recommendations and appropriate sources post-operatively  Identifies the need for appropriate texture modifications, mastication, and bite sizes when consuming solids  Identifies appropriate multivitamin and calcium sources post-operatively  Describes the need for physical activity post-operatively and will follow MD recommendations  States when to call healthcare provider regarding medication questions or post-operative complications   Handouts given during class include:  Phase 3A: Soft, High Protein Diet Handout   Follow-Up Plan: Patient will follow-up at [facility] in 6 weeks for 2 month post-op nutrition visit for diet advancement per MD.

## 2020-06-15 ENCOUNTER — Telehealth: Payer: Self-pay | Admitting: Skilled Nursing Facility1

## 2020-06-15 NOTE — Telephone Encounter (Signed)
RD called pt to verify fluid intake once starting soft, solid proteins 2 week post-bariatric surgery.   Daily Fluid intake: 64+  Daily Protein intake: 60+  Concerns/issues:   No concerns  

## 2020-06-16 ENCOUNTER — Other Ambulatory Visit: Payer: Self-pay | Admitting: Psychiatry

## 2020-06-16 DIAGNOSIS — F3177 Bipolar disorder, in partial remission, most recent episode mixed: Secondary | ICD-10-CM

## 2020-06-17 ENCOUNTER — Telehealth (INDEPENDENT_AMBULATORY_CARE_PROVIDER_SITE_OTHER): Admitting: Psychiatry

## 2020-06-17 ENCOUNTER — Other Ambulatory Visit: Payer: Self-pay

## 2020-06-17 ENCOUNTER — Encounter: Payer: Self-pay | Admitting: Psychiatry

## 2020-06-17 DIAGNOSIS — F3176 Bipolar disorder, in full remission, most recent episode depressed: Secondary | ICD-10-CM | POA: Diagnosis not present

## 2020-06-17 DIAGNOSIS — F411 Generalized anxiety disorder: Secondary | ICD-10-CM

## 2020-06-17 DIAGNOSIS — Z79899 Other long term (current) drug therapy: Secondary | ICD-10-CM | POA: Insufficient documentation

## 2020-06-17 DIAGNOSIS — F3130 Bipolar disorder, current episode depressed, mild or moderate severity, unspecified: Secondary | ICD-10-CM | POA: Insufficient documentation

## 2020-06-17 DIAGNOSIS — F5101 Primary insomnia: Secondary | ICD-10-CM | POA: Diagnosis not present

## 2020-06-17 NOTE — Progress Notes (Signed)
Provider Location : [facility] Patient Location : Home  Virtual Visit via Video Note  I connected with Gloria Garza on 06/17/20 at  2:40 PM EDT by a video enabled telemedicine application and verified that I am speaking with the correct person using two identifiers.   I discussed the limitations of evaluation and management by telemedicine and the availability of in person appointments. The patient expressed understanding and agreed to proceed.    I discussed the assessment and treatment plan with the patient. The patient was provided an opportunity to ask questions and all were answered. The patient agreed with the plan and demonstrated an understanding of the instructions.   The patient was advised to call back or seek an in-person evaluation if the symptoms worsen or if the condition fails to improve as anticipated.  Gloria Garza OP Progress Note  06/17/2020 6:19 PM Gloria Garza  MRN:  [id number]  Chief Complaint:  Chief Complaint    Follow-up     HPI: Gloria Garza is a 39 year old Caucasian female who has a history of bipolar disorder, lives in [place], married was evaluated by telemedicine today.  Patient today reports she is currently recovering from her gastric sleeve surgery which was done on 05/26/2020.  She reports the first couple of weeks was difficult since she had to be on a liquid diet.  She reports since it made her emotional she started psychotherapy sessions with our therapist here at our practice.  She reports so far therapy sessions is helpful.    Patient reports mood symptoms are currently stable.  She denies any significant mood swings.  She reports doxepin is beneficial for her sleep.  She denies any side effects to medications.  She reports she was unable to get the valproic acid level and other labs ordered.  She however agrees to get it done soon.  Patient reports she and her partner are relocating to [place] in October.  She reports her partner has a job change and that  is why they are moving.  She looks forward to the move.  Patient denies any suicidality, homicidality or perceptual disturbances.  Patient denies any other concerns today.  Visit Diagnosis:    ICD-10-CM   1. Bipolar disorder, in full remission, most recent episode depressed ([place])  F31.76   2. Primary insomnia  F51.01   3. GAD (generalized anxiety disorder)  F41.1   4. High risk medication use  Z79.899     Past Psychiatric History: I have reviewed past psychiatric history from my progress note on 12/18/2018.  Past trials of Zoloft.  Past Medical History:  Past Medical History:  Diagnosis Date  . Anxiety   . Bipolar disorder ([place])   . Depression   . Pneumonia   . Pre-diabetes     Past Surgical History:  Procedure Laterality Date  . CESAREAN SECTION     x2  . CYSTOSCOPY  09/26/2019   Procedure: CYSTOSCOPY;  Surgeon: Gae Dry, Garza;  Location: [facility];  Service: Gynecology;;  . ESOPHAGOGASTRODUODENOSCOPY N/A 05/26/2020   Procedure: Upper endoscopy;  Surgeon: Greer Pickerel, Garza;  Location: [facility];  Service: General;  Laterality: N/A;  . LAPAROSCOPIC GASTRIC SLEEVE RESECTION N/A 05/26/2020   Procedure: LAPAROSCOPIC GASTRIC SLEEVE;  Surgeon: Greer Pickerel, Garza;  Location: [facility];  Service: General;  Laterality: N/A;  . LAPAROSCOPIC HYSTERECTOMY N/A 09/26/2019   Procedure: HYSTERECTOMY TOTAL LAPAROSCOPIC;  Surgeon: Gae Dry, Garza;  Location: [facility];  Service: Gynecology;  Laterality: N/A;  . TUBAL  LIGATION      Family Psychiatric History: I have reviewed family psychiatric history from my progress note on 12/18/2018.  Family History:  Family History  Problem Relation Age of Onset  . Diabetes Maternal Grandmother   . Cancer - Colon Maternal Grandmother   . Diabetes Paternal Grandmother   . Heart disease Paternal Grandmother   . Alcohol abuse Brother   . Drug abuse Brother     Social History: I have reviewed social history from my progress note on 12/18/2018. Social  History   Socioeconomic History  . Marital status: Married    Spouse name: terah  . Number of children: 2  . Years of education: Not on file  . Highest education level: Associate degree: occupational, Hotel manager, or vocational program  Occupational History  . Not on file  Tobacco Use  . Smoking status: Former Smoker    Years: 5.00    Types: Cigarettes    Quit date: 07/18/2018    Years since quitting: 1.9  . Smokeless tobacco: Never Used  Vaping Use  . Vaping Use: Never used  Substance and Sexual Activity  . Alcohol use: Not Currently    Comment: rarely  . Drug use: Never  . Sexual activity: Yes    Birth control/protection: Surgical  Other Topics Concern  . Not on file  Social History Narrative  . Not on file   Social Determinants of Health   Financial Resource Strain:   . Difficulty of Paying Living Expenses:   Food Insecurity:   . Worried About Charity fundraiser in the Last Year:   . Arboriculturist in the Last Year:   Transportation Needs:   . Film/video editor (Medical):   Marland Kitchen Lack of Transportation (Non-Medical):   Physical Activity:   . Days of Exercise per Week:   . Minutes of Exercise per Session:   Stress:   . Feeling of Stress :   Social Connections:   . Frequency of Communication with Friends and Family:   . Frequency of Social Gatherings with Friends and Family:   . Attends Religious Services:   . Active Member of Clubs or Organizations:   . Attends Archivist Meetings:   Marland Kitchen Marital Status:     Allergies: No Known Allergies  Metabolic Disorder Labs: Lab Results  Component Value Date   HGBA1C 6.9 (H) 05/15/2020   MPG 151.33 05/15/2020   Lab Results  Component Value Date   PROLACTIN 14.9 12/31/2018   Lab Results  Component Value Date   CHOL 200 (H) 12/31/2018   TRIG 262 (H) 12/31/2018   HDL 48 12/31/2018   LDLCALC 100 (H) 12/31/2018   Lab Results  Component Value Date   TSH 1.340 10/29/2018    Therapeutic Level  Labs: No results found for: LITHIUM Lab Results  Component Value Date   VALPROATE 44 (L) 06/05/2019   VALPROATE 49 (L) 05/22/2019   No components found for:  CBMZ  Current Medications: Current Outpatient Medications  Medication Sig Dispense Refill  . acetaminophen (TYLENOL) 500 MG tablet Take 1,000 mg by mouth every 6 (six) hours as needed (for pain.).    Marland Kitchen divalproex (DEPAKOTE ER) 500 MG 24 hr tablet TAKE 3 TABLETS(1500 MG) BY MOUTH DAILY 270 tablet 0  . doxepin (SINEQUAN) 10 MG capsule TAKE 1 TO 2 CAPSULES(10 TO 20 MG) BY MOUTH AT BEDTIME AS NEEDED FOR SLEEP 60 capsule 1  . gabapentin (NEURONTIN) 100 MG capsule Take 2 capsules (200 mg  total) by mouth every 12 (twelve) hours. 20 capsule 0  . hydrOXYzine (ATARAX/VISTARIL) 25 MG tablet Take 0.5-1 tablets (12.5-25 mg total) by mouth 3 (three) times daily as needed. Severe anxiety and irritability (Patient taking differently: Take 12.5-25 mg by mouth 3 (three) times daily as needed (Severe anxiety and irritability). ) 90 tablet 1  . Misc Natural Products (APPLE CIDER VINEGAR DIET PO) Take 2 tablets by mouth in the morning and at bedtime. Gummy    . ondansetron (ZOFRAN-ODT) 4 MG disintegrating tablet Take 1 tablet (4 mg total) by mouth every 6 (six) hours as needed for nausea or vomiting. 20 tablet 0  . pantoprazole (PROTONIX) 40 MG tablet Take 1 tablet (40 mg total) by mouth daily. 90 tablet 0  . traMADol (ULTRAM) 50 MG tablet Take 1 tablet (50 mg total) by mouth every 6 (six) hours as needed (pain). 10 tablet 0   No current facility-administered medications for this visit.     Musculoskeletal: Strength & Muscle Tone: UTA Gait & Station: normal Patient leans: N/A  Psychiatric Specialty Exam: Review of Systems  Psychiatric/Behavioral: Negative for agitation, behavioral problems, confusion, decreased concentration, dysphoric mood, hallucinations, self-injury, sleep disturbance and suicidal ideas. The patient is not nervous/anxious and is  not hyperactive.   All other systems reviewed and are negative.   Last menstrual period 08/30/2019.There is no height or weight on file to calculate BMI.  General Appearance: Casual  Eye Contact:  Fair  Speech:  Clear and Coherent  Volume:  Normal  Mood:  Euthymic  Affect:  Congruent  Thought Process:  Goal Directed and Descriptions of Associations: Intact  Orientation:  Full (Time, Place, and Person)  Thought Content: Logical   Suicidal Thoughts:  No  Homicidal Thoughts:  No  Memory:  Immediate;   Fair Recent;   Fair Remote;   Fair  Judgement:  Fair  Insight:  Fair  Psychomotor Activity:  Normal  Concentration:  Concentration: Fair and Attention Span: Fair  Recall:  [employer] of Knowledge: Fair  Language: Fair  Akathisia:  No  Handed:  Right  AIMS (if indicated): UTA  Assets:  Communication Skills Desire for Improvement Housing Social Support  ADL's:  Intact  Cognition: WNL  Sleep:  Fair   Screenings: PHQ2-9     Office Visit from 06/04/2020 in [facility], [facility] Office Visit from 11/25/2019 in [facility], [facility] Office Visit from 07/17/2019 in [facility] Nutrition from 06/11/2019 in [place] Office Visit from 01/16/2019 in [facility], [facility]  PHQ-2 Total Score 1 1 0 0 0       Assessment and Plan: Gloria Garza is a 39 year old Caucasian female who has a history of bipolar disorder in remission, anxiety disorder was evaluated by telemedicine today.  Patient with psychosocial stressors of the recent surgery, the recovery process from the same, the current pandemic.  Patient however is currently making progress.  Plan as noted below.  Plan Bipolar disorder in remission Depakote ER 1500 mg p.o. daily.  Patient currently takes it 3 times a day since she she had recent gastric sleeve surgery. Depakote level on 06/05/2019-44-subtherapeutic. Pending Depakote level-patient has been noncompliant.  It was  ordered last visit.  She agrees to get it done. Hydroxyzine 12.5 to 25 mg p.o. 3 times daily as needed for severe anxiety/irritability.  Insomnia-stable Doxepin 10 to 20 mg p.o. nightly as needed She also has hydroxyzine as needed for anxiety at bedtime  GAD-stable Hydroxyzine  12.5 to 25 mg p.o. 3 times daily as needed   Specific phobia-flying-stable Continue CBT  I have reviewed medical records per Dr. Lillia Pauls 05/26/2020-' patient had laparoscopic sleeve gastrectomy-patient was stable at discharge.'  High risk medication use-pending labs-Depakote level, CMP, CBC with differential-she has been noncompliant with labs and agrees to go to [facility].  Patient encouraged to do so.  Follow-up in clinic in 2 months or sooner if needed.  I have spent atleast 20 minutes non face to face with patient today. More than 50 % of the time was spent for preparing to see the patient ( e.g., review of test, records ), obtaining and to review and separately obtained history , ordering medications and test ,psychoeducation and supportive psychotherapy and care coordination,as well as documenting clinical information in electronic health record. This note was generated in part or whole with voice recognition software. Voice recognition is usually quite accurate but there are transcription errors that can and very often do occur. I apologize for any typographical errors that were not detected and corrected.        Ursula Alert, Garza 06/17/2020, 6:19 PM

## 2020-07-01 LAB — COMPREHENSIVE METABOLIC PANEL
ALT: 27 IU/L (ref 0–32)
AST: 23 IU/L (ref 0–40)
Albumin/Globulin Ratio: 1.6 (ref 1.2–2.2)
Albumin: 4.2 g/dL (ref 3.8–4.8)
Alkaline Phosphatase: 66 IU/L (ref 48–121)
BUN/Creatinine Ratio: 10 (ref 9–23)
BUN: 7 mg/dL (ref 6–20)
Bilirubin Total: 0.4 mg/dL (ref 0.0–1.2)
CO2: 19 mmol/L — ABNORMAL LOW (ref 20–29)
Calcium: 8.9 mg/dL (ref 8.7–10.2)
Chloride: 105 mmol/L (ref 96–106)
Creatinine, Ser: 0.67 mg/dL (ref 0.57–1.00)
GFR calc Af Amer: 130 mL/min/{1.73_m2} (ref >59)
GFR calc non Af Amer: 113 mL/min/{1.73_m2} (ref >59)
Globulin, Total: 2.7 g/dL (ref 1.5–4.5)
Glucose: 109 mg/dL — ABNORMAL HIGH (ref 65–99)
Potassium: 4.1 mmol/L (ref 3.5–5.2)
Sodium: 141 mmol/L (ref 134–144)
Total Protein: 6.9 g/dL (ref 6.0–8.5)

## 2020-07-01 LAB — CBC WITH DIFFERENTIAL
Basophils Absolute: 0 10*3/uL (ref 0.0–0.2)
Basos: 0 %
EOS (ABSOLUTE): 0.1 10*3/uL (ref 0.0–0.4)
Eos: 2 %
Hematocrit: 40.2 % (ref 34.0–46.6)
Hemoglobin: 13.2 g/dL (ref 11.1–15.9)
Immature Grans (Abs): 0 10*3/uL (ref 0.0–0.1)
Immature Granulocytes: 0 %
Lymphocytes Absolute: 2.2 10*3/uL (ref 0.7–3.1)
Lymphs: 39 %
MCH: 29.6 pg (ref 26.6–33.0)
MCHC: 32.8 g/dL (ref 31.5–35.7)
MCV: 90 fL (ref 79–97)
Monocytes Absolute: 0.5 10*3/uL (ref 0.1–0.9)
Monocytes: 9 %
Neutrophils Absolute: 2.9 10*3/uL (ref 1.4–7.0)
Neutrophils: 50 %
RBC: 4.46 x10E6/uL (ref 3.77–5.28)
RDW: 13.8 % (ref 11.7–15.4)
WBC: 5.7 10*3/uL (ref 3.4–10.8)

## 2020-07-01 LAB — VALPROIC ACID LEVEL: Valproic Acid Lvl: 33 ug/mL — ABNORMAL LOW (ref 50–100)

## 2020-07-06 ENCOUNTER — Ambulatory Visit: Admitting: Licensed Clinical Social Worker

## 2020-07-06 ENCOUNTER — Telehealth: Payer: Self-pay | Admitting: Licensed Clinical Social Worker

## 2020-07-06 ENCOUNTER — Other Ambulatory Visit: Payer: Self-pay

## 2020-07-06 NOTE — Telephone Encounter (Signed)
Therapist attempted to reach patient via phone for today's session after not responding to email and text invite via video chat. Pt reported she tried to call the office earlier to reschedule. Pt to be rescheduled for 8/26 at 2pm.

## 2020-07-16 ENCOUNTER — Ambulatory Visit: Admitting: Licensed Clinical Social Worker

## 2020-07-21 ENCOUNTER — Other Ambulatory Visit: Payer: Self-pay

## 2020-07-21 ENCOUNTER — Encounter: Attending: General Surgery | Admitting: Skilled Nursing Facility1

## 2020-07-21 DIAGNOSIS — E669 Obesity, unspecified: Secondary | ICD-10-CM | POA: Insufficient documentation

## 2020-07-21 NOTE — Progress Notes (Signed)
Bariatric Nutrition Follow-Up Visit Medical Nutrition Therapy   NUTRITION ASSESSMENT    Anthropometrics  Start weight at [facility]: 285.1 lbs (date: 07/21/2020) Today's weight: 252 lbs   Clinical  Medical hx: Bipolar, GAD, Prediabetes Medications: see list Labs:    Lifestyle & Dietary Hx  Pt states she has been well with no complaints.  Pt states this has been really easy.  Pt states she has been eating non starchy vegetables already.   Estimated daily fluid intake: 70+ oz Estimated daily protein intake: 80+ g Supplements: celebrate and calcium 6000 mg (Dietitian pt on needing to cut her tums in half and only take 1 at a time) Current average weekly physical activity: 2 miles 4 times a week power walking sweaty and out of breath    24-Hr Dietary Recall First Meal: 2 eggs + bacon + [place] Snack: yogurt Second Meal: lunch meat + cheese Snack: sugar free popcicle Third Meal: baked chicken or fish or shrimp + beans Snack:  Beverages: water, gatorade zero protein, gatorade zero   Post-Op Goals/ Signs/ Symptoms Using straws: no Drinking while eating: no Chewing/swallowing difficulties: no Changes in vision: no Changes to mood/headaches: no Hair loss/changes to skin/nails: no Difficulty focusing/concentrating: no Sweating: no Dizziness/lightheadedness: no Palpitations: no  Carbonated/caffeinated beverages: no N/V/D/C/Gas: no Abdominal pain: no Dumping syndrome: no    NUTRITION DIAGNOSIS  Overweight/obesity ([place]-3.3) related to past poor dietary habits and physical inactivity as evidenced by completed bariatric surgery and following dietary guidelines for continued weight loss and healthy nutrition status.     NUTRITION INTERVENTION Nutrition counseling (C-1) and education (E-2) to facilitate bariatric surgery goals, including: . Diet advancement to the next phase (phase 4) now including  Non starchy vegetables  . The importance of consuming adequate calories as well as  certain nutrients daily due to the body's need for essential vitamins, minerals, and fats . The importance of daily physical activity and to reach a goal of at least 150 minutes of moderate to vigorous physical activity weekly (or as directed by their physician) due to benefits such as increased musculature and improved lab values . The importance of intuitive eating specifically learning hunger-satiety cues and understanding the importance of learning a new body  Goals: -Continue to aim for a minimum of 64 fluid ounces 7 days a week with at least 30 ounces being plain water -Eat non-starchy vegetables 2 times a day 7 days a week -Start out with soft cooked vegetables today and tomorrow; if tolerated begin to eat raw vegetables or cooked including salads -Eat your 3 ounces of protein first then start in on your non-starchy vegetables; once you understand how much of your meal leads to satisfaction and not full while still eating 3 ounces of protein and non-starchy vegetables you can eat them in any order  -Continue to aim for 30 minutes of activity at least 5 times a week -Do NOT cook with/add to your food: alfredo sauce, cheese sauce, barbeque sauce, ketchup, fat back, butter, bacon grease, grease, Crisco, OR SUGAR   Handouts Provided Include  Phase 4  Learning Style & Readiness for Change Teaching method utilized: Visual & Auditory  Demonstrated degree of understanding via: Teach Back  Barriers to learning/adherence to lifestyle change: none identified   RD's Notes for Next Visit . Assess adherence to pt chosen goals   MONITORING & EVALUATION Dietary intake, weekly physical activity, body weight  Next Steps Patient is to follow-up in 3 months

## 2020-08-04 ENCOUNTER — Other Ambulatory Visit: Payer: Self-pay

## 2020-08-04 ENCOUNTER — Telehealth (INDEPENDENT_AMBULATORY_CARE_PROVIDER_SITE_OTHER): Admitting: Psychiatry

## 2020-08-04 DIAGNOSIS — F5101 Primary insomnia: Secondary | ICD-10-CM

## 2020-08-04 DIAGNOSIS — F411 Generalized anxiety disorder: Secondary | ICD-10-CM | POA: Diagnosis not present

## 2020-08-04 DIAGNOSIS — F3176 Bipolar disorder, in full remission, most recent episode depressed: Secondary | ICD-10-CM

## 2020-08-04 DIAGNOSIS — Z79899 Other long term (current) drug therapy: Secondary | ICD-10-CM

## 2020-08-04 MED ORDER — DOXEPIN HCL 10 MG PO CAPS: ORAL_CAPSULE | ORAL | 1 refills | Status: DC

## 2020-08-04 MED ORDER — DIVALPROEX SODIUM ER 500 MG PO TB24: ORAL_TABLET | ORAL | 1 refills | Status: DC

## 2020-08-04 NOTE — Progress Notes (Signed)
Provider Location : [facility] Patient Location : Car  Participants: Patient , Provider  Virtual Visit via Video Note  I connected with Gloria Garza on 08/04/20 at  4:20 PM EDT by a video enabled telemedicine application and verified that I am speaking with the correct person using two identifiers.   I discussed the limitations of evaluation and management by telemedicine and the availability of in person appointments. The patient expressed understanding and agreed to proceed.    I discussed the assessment and treatment plan with the patient. The patient was provided an opportunity to ask questions and all were answered. The patient agreed with the plan and demonstrated an understanding of the instructions.   The patient was advised to call back or seek an in-person evaluation if the symptoms worsen or if the condition fails to improve as anticipated.  [place] MD OP Progress Note  08/04/2020 4:33 PM Gloria Garza  MRN:  [id number]  Chief Complaint:  Chief Complaint    Follow-up     HPI: Gloria Garza is a 39 year old Caucasian female who has a history of bipolar disorder, lives in [place], married was evaluated by telemedicine today.  Patient today reports she is currently doing well on the current medication regimen.  She continues to be compliant on her medications as prescribed and denies any side effects.  She reports she continues to do well after her gastric sleeve surgery.  She has lost 50 pounds .  Patient denies any suicidality, homicidality or perceptual disturbances.  She reports she plans to relocate to [place] in November or December.  Patient denies any other concerns today.  Visit Diagnosis:    ICD-10-CM   1. Bipolar disorder, in full remission, most recent episode depressed (HCC)  F31.76 divalproex (DEPAKOTE ER) 500 MG 24 hr tablet    doxepin (SINEQUAN) 10 MG capsule  2. Primary insomnia  F51.01   3. GAD (generalized anxiety disorder)  F41.1   4. High risk medication  use  Z79.899     Past Psychiatric History: I have reviewed past psychiatric history from my progress note on 12/18/2018.  Past trials of Zoloft  Past Medical History:  Past Medical History:  Diagnosis Date  . Anxiety   . Bipolar disorder ([place])   . Depression   . Pneumonia   . Pre-diabetes     Past Surgical History:  Procedure Laterality Date  . CESAREAN SECTION     x2  . CYSTOSCOPY  09/26/2019   Procedure: CYSTOSCOPY;  Surgeon: Gae Dry, MD;  Location: [facility] ORS;  Service: Gynecology;;  . ESOPHAGOGASTRODUODENOSCOPY N/A 05/26/2020   Procedure: Upper endoscopy;  Surgeon: Greer Pickerel, MD;  Location: [facility];  Service: General;  Laterality: N/A;  . LAPAROSCOPIC GASTRIC SLEEVE RESECTION N/A 05/26/2020   Procedure: LAPAROSCOPIC GASTRIC SLEEVE;  Surgeon: Greer Pickerel, MD;  Location: [facility];  Service: General;  Laterality: N/A;  . LAPAROSCOPIC HYSTERECTOMY N/A 09/26/2019   Procedure: HYSTERECTOMY TOTAL LAPAROSCOPIC;  Surgeon: Gae Dry, MD;  Location: [facility] ORS;  Service: Gynecology;  Laterality: N/A;  . TUBAL LIGATION      Family Psychiatric History: I have reviewed family psychiatric history from my progress note on 12/18/2018  Family History:  Family History  Problem Relation Age of Onset  . Diabetes Maternal Grandmother   . Cancer - Colon Maternal Grandmother   . Diabetes Paternal Grandmother   . Heart disease Paternal Grandmother   . Alcohol abuse Brother   . Drug abuse Brother     Social History:  I have reviewed social history from my progress note on 12/18/2018 Social History   Socioeconomic History  . Marital status: Married    Spouse name: terah  . Number of children: 2  . Years of education: Not on file  . Highest education level: Associate degree: occupational, Hotel manager, or vocational program  Occupational History  . Not on file  Tobacco Use  . Smoking status: Former Smoker    Years: 5.00    Types: Cigarettes    Quit date: 07/18/2018    Years since  quitting: 2.0  . Smokeless tobacco: Never Used  Vaping Use  . Vaping Use: Never used  Substance and Sexual Activity  . Alcohol use: Not Currently    Comment: rarely  . Drug use: Never  . Sexual activity: Yes    Birth control/protection: Surgical  Other Topics Concern  . Not on file  Social History Narrative  . Not on file   Social Determinants of Health   Financial Resource Strain:   . Difficulty of Paying Living Expenses: Not on file  Food Insecurity:   . Worried About Charity fundraiser in the Last Year: Not on file  . Ran Out of Food in the Last Year: Not on file  Transportation Needs:   . Lack of Transportation (Medical): Not on file  . Lack of Transportation (Non-Medical): Not on file  Physical Activity:   . Days of Exercise per Week: Not on file  . Minutes of Exercise per Session: Not on file  Stress:   . Feeling of Stress : Not on file  Social Connections:   . Frequency of Communication with Friends and Family: Not on file  . Frequency of Social Gatherings with Friends and Family: Not on file  . Attends Religious Services: Not on file  . Active Member of Clubs or Organizations: Not on file  . Attends Archivist Meetings: Not on file  . Marital Status: Not on file    Allergies: No Known Allergies  Metabolic Disorder Labs: Lab Results  Component Value Date   HGBA1C 6.9 (H) 05/15/2020   MPG 151.33 05/15/2020   Lab Results  Component Value Date   PROLACTIN 14.9 12/31/2018   Lab Results  Component Value Date   CHOL 200 (H) 12/31/2018   TRIG 262 (H) 12/31/2018   HDL 48 12/31/2018   LDLCALC 100 (H) 12/31/2018   Lab Results  Component Value Date   TSH 1.340 10/29/2018    Therapeutic Level Labs: No results found for: LITHIUM Lab Results  Component Value Date   VALPROATE 33 (L) 06/30/2020   VALPROATE 44 (L) 06/05/2019   No components found for:  CBMZ  Current Medications: Current Outpatient Medications  Medication Sig Dispense Refill   . acetaminophen (TYLENOL) 500 MG tablet Take 1,000 mg by mouth every 6 (six) hours as needed (for pain.).    Marland Kitchen divalproex (DEPAKOTE ER) 500 MG 24 hr tablet TAKE 3 TABLETS(1500 MG) BY MOUTH DAILY 270 tablet 1  . doxepin (SINEQUAN) 10 MG capsule TAKE 1 TO 2 CAPSULES(10 TO 20 MG) BY MOUTH AT BEDTIME AS NEEDED FOR SLEEP 180 capsule 1  . gabapentin (NEURONTIN) 100 MG capsule Take 2 capsules (200 mg total) by mouth every 12 (twelve) hours. 20 capsule 0  . hydrOXYzine (ATARAX/VISTARIL) 25 MG tablet Take 0.5-1 tablets (12.5-25 mg total) by mouth 3 (three) times daily as needed. Severe anxiety and irritability (Patient taking differently: Take 12.5-25 mg by mouth 3 (three) times daily as needed (  Severe anxiety and irritability). ) 90 tablet 1  . Misc Natural Products (APPLE CIDER VINEGAR DIET PO) Take 2 tablets by mouth in the morning and at bedtime. Gummy    . ondansetron (ZOFRAN-ODT) 4 MG disintegrating tablet Take 1 tablet (4 mg total) by mouth every 6 (six) hours as needed for nausea or vomiting. 20 tablet 0  . pantoprazole (PROTONIX) 40 MG tablet Take 1 tablet (40 mg total) by mouth daily. 90 tablet 0  . traMADol (ULTRAM) 50 MG tablet Take 1 tablet (50 mg total) by mouth every 6 (six) hours as needed (pain). 10 tablet 0   No current facility-administered medications for this visit.     Musculoskeletal: Strength & Muscle Tone: UTA Gait & Station: normal Patient leans: N/A  Psychiatric Specialty Exam: Review of Systems  Psychiatric/Behavioral: Negative for agitation, behavioral problems, confusion, decreased concentration, dysphoric mood, hallucinations, self-injury, sleep disturbance and suicidal ideas. The patient is not nervous/anxious and is not hyperactive.   All other systems reviewed and are negative.   Last menstrual period 08/30/2019.There is no height or weight on file to calculate BMI.  General Appearance: Casual  Eye Contact:  Fair  Speech:  Clear and Coherent  Volume:  Normal   Mood:  Euthymic  Affect:  Congruent  Thought Process:  Goal Directed and Descriptions of Associations: Intact  Orientation:  Full (Time, Place, and Person)  Thought Content: Logical   Suicidal Thoughts:  No  Homicidal Thoughts:  No  Memory:  Immediate;   Fair Recent;   Fair Remote;   Fair  Judgement:  Fair  Insight:  Fair  Psychomotor Activity:  Normal  Concentration:  Concentration: Fair and Attention Span: Fair  Recall:  [employer] of Knowledge: Fair  Language: Fair  Akathisia:  No  Handed:  Right  AIMS (if indicated):UTA  Assets:  Communication Skills Desire for Improvement Housing Social Support  ADL's:  Intact  Cognition: WNL  Sleep:  Fair   Screenings: PHQ2-9     Office Visit from 06/04/2020 in [facility] Office Visit from 11/25/2019 in [facility] Office Visit from 07/17/2019 in [facility], [facility] Nutrition from 06/11/2019 in [place] Office Visit from 01/16/2019 in [facility], [facility]  PHQ-2 Total Score 1 1 0 0 0       Assessment and Plan: Gloria Garza is a 39 year old Caucasian female who has a history of bipolar disorder in remission, anxiety disorder was evaluated by telemedicine today.  Patient with psychosocial stressors of recent surgery, upcoming relocation.  She however is currently stable on current medication regimen.  Plan as noted below.  Plan Bipolar disorder in remission Depakote ER 1500 mg p.o. daily. Depakote level reviewed and discussed with patient-dated 06/30/2020-33-subtherapeutic.  Patient however reports mood symptoms are stable, will not readjusted dosage today. CBC with differential-within normal limits. CMP-within normal limits.  Insomnia-stable Doxepin 20 mg p.o. nightly as needed Patient also has hydroxyzine available.  GAD-stable Hydroxyzine 12.5 to 25 mg p.o. 3 times daily as needed  Specific phobia-plan-stable We will monitor closely.  Patient is  relocating to [place].  Patient advised to request medical records.  I have provided 62-month supply of her medications.  Patient to follow-up in clinic as needed.  I have spent atleast 20 minutes face to face with patient today. More than 50 % of the time was spent for preparing to see the patient ( e.g., review of test, records ), ordering medications and test ,psychoeducation  and supportive psychotherapy and care coordination,as well as documenting clinical information in electronic health record. This note was generated in part or whole with voice recognition software. Voice recognition is usually quite accurate but there are transcription errors that can and very often do occur. I apologize for any typographical errors that were not detected and corrected.      Ursula Alert, MD 08/05/2020, 8:24 AM

## 2020-08-05 ENCOUNTER — Encounter: Payer: Self-pay | Admitting: Psychiatry

## 2020-08-22 ENCOUNTER — Other Ambulatory Visit: Payer: Self-pay | Admitting: Psychiatry

## 2020-08-26 ENCOUNTER — Other Ambulatory Visit: Payer: Self-pay | Admitting: Psychiatry

## 2020-08-26 DIAGNOSIS — F3176 Bipolar disorder, in full remission, most recent episode depressed: Secondary | ICD-10-CM

## 2020-09-03 ENCOUNTER — Ambulatory Visit: Admitting: Hospice and Palliative Medicine

## 2020-09-10 ENCOUNTER — Telehealth: Payer: Self-pay

## 2020-09-10 NOTE — Telephone Encounter (Signed)
Completed medical records request. Called patient and advised ready to be picked up at front desk.  Family member medical summary also completed for [employer] and also ready  For pickup at front desk.

## 2020-09-21 ENCOUNTER — Encounter: Attending: General Surgery | Admitting: Skilled Nursing Facility1

## 2020-09-21 ENCOUNTER — Other Ambulatory Visit: Payer: Self-pay

## 2020-09-21 DIAGNOSIS — E669 Obesity, unspecified: Secondary | ICD-10-CM | POA: Insufficient documentation

## 2020-09-21 NOTE — Progress Notes (Signed)
Bariatric Nutrition Follow-Up Visit Medical Nutrition Therapy   NUTRITION ASSESSMENT   Sx date July 7th, 2021  Anthropometrics  Start weight at [facility]: 285.1 lbs (date: 07/21/2020) Today's weight: 239 lbs   Clinical  Medical hx: Bipolar, GAD, Prediabetes Medications: see list Labs:    Lifestyle & Dietary Hx  Pt states she has been well with no complaints.  Pt states this has been really easy.   Pt states she is excited she increased her intensity with activity.   Body Composition Scale 09/21/2020  Current Body Weight 239.1  Total Body Fat % 41.5  Visceral Fat 11  Fat-Free Mass % 58.4   Total Body Water % 43.7  Muscle-Mass lbs 34.8  BMI 37.1  Body Fat Displacement          Torso  lbs 61.5         Left Leg  lbs 12.3         Right Leg  lbs 12.3         Left Arm  lbs 6.1         Right Arm   lbs 6.1    Estimated daily fluid intake: 70+ oz Estimated daily protein intake: 80+ g Supplements: celebrate and calcium 600 mg (Dietitian pt on needing to cut her tums in half and only take 1 at a time) Current average weekly physical activity: 2 miles 4 times a week power walking sweaty and out of breath    24-Hr Dietary Recall First Meal: 2 eggs + bacon + avocado or cold cereal (40 grams carb) Snack: yogurt or protein shake Second Meal: lunch meat + cheese Snack: sugar free popcicle or carrots + low fat ranch Third Meal: baked chicken or fish or shrimp + beans + brown rice Snack:  Beverages: water, gatorade zero protein, gatorade zero   Post-Op Goals/ Signs/ Symptoms Using straws: no Drinking while eating: no Chewing/swallowing difficulties: no Changes in vision: no Changes to mood/headaches: no Hair loss/changes to skin/nails: no Difficulty focusing/concentrating: no Sweating: no Dizziness/lightheadedness: no Palpitations: no  Carbonated/caffeinated beverages: no N/V/D/C/Gas: no Abdominal pain: no Dumping syndrome: no    NUTRITION DIAGNOSIS  Overweight/obesity  ([place]-3.3) related to past poor dietary habits and physical inactivity as evidenced by completed bariatric surgery and following dietary guidelines for continued weight loss and healthy nutrition status.  Dietitian educated pt on carbohydrate intake and reaching her future goals.      NUTRITION INTERVENTION Nutrition counseling (C-1) and education (E-2) to facilitate bariatric surgery goals, including: . The importance of consuming adequate calories as well as certain nutrients daily due to the body's need for essential vitamins, minerals, and fats . The importance of daily physical activity and to reach a goal of at least 150 minutes of moderate to vigorous physical activity weekly (or as directed by their physician) due to benefits such as increased musculature and improved lab values . The importance of intuitive eating specifically learning hunger-satiety cues and understanding the importance of learning a new body Importance of vegetables . To have an overall healthy diet, adult men and women are recommended to consume anywhere from 2-3 cups of vegetables daily. Vegetables provide a wide range of vitamins and minerals such as vitamin A, vitamin C, potassium, and folic acid. According to the [facility], including fruit and vegetables daily may reduce the risk of cardiovascular disease, certain cancers, and other non-communicable diseases.  Goals: -Continue to aim for a minimum of 64 fluid ounces 7 days a week with at  least 30 ounces being plain water -Eat non-starchy vegetables 2 times a day 7 days a week -Start out with soft cooked vegetables today and tomorrow; if tolerated begin to eat raw vegetables or cooked including salads -Eat your 3 ounces of protein first then start in on your non-starchy vegetables; once you understand how much of your meal leads to satisfaction and not full while still eating 3 ounces of protein and non-starchy vegetables you can eat them in any order   -Continue to aim for 30 minutes of activity at least 5 times a week -Do NOT cook with/add to your food: alfredo sauce, cheese sauce, barbeque sauce, ketchup, fat back, butter, bacon grease, grease, Crisco, OR SUGAR   Limit fruit and other complex carbohydrates (crackers, bread, oatmeal, grits, pasta, rice, beans, corn, peas, etc.) to 3 times a day; one serving per  1/3-1/2 cup Avoid juice completley   Handouts Provided Include   Learning Style & Readiness for Change Teaching method utilized: Visual & Auditory  Demonstrated degree of understanding via: Teach Back  Barriers to learning/adherence to lifestyle change: none identified   RD's Notes for Next Visit . Assess adherence to pt chosen goals   MONITORING & EVALUATION Dietary intake, weekly physical activity, body weight  Next Steps Patient is to follow-up in January

## 2020-11-30 ENCOUNTER — Ambulatory Visit: Admitting: Skilled Nursing Facility1

## 2020-12-16 ENCOUNTER — Encounter: Attending: General Surgery | Admitting: Skilled Nursing Facility1

## 2020-12-16 ENCOUNTER — Other Ambulatory Visit: Payer: Self-pay

## 2020-12-16 DIAGNOSIS — E669 Obesity, unspecified: Secondary | ICD-10-CM | POA: Insufficient documentation

## 2020-12-16 NOTE — Progress Notes (Signed)
Bariatric Nutrition Follow-Up Visit Medical Nutrition Therapy   NUTRITION ASSESSMENT   Sx date July 7th, 2021  Anthropometrics  Start weight at [facility]: 285.1 lbs (date: 07/21/2020) Today's weight: 226.8 lbs   Clinical  Medical hx: Bipolar, GAD, Prediabetes Medications: see list Labs:    Lifestyle & Dietary Hx  Pt states she has been well with no complaints.  Pt states this has been really easy.   Pt states she is excited she increased her intensity with activity. Pt states she eats potateos but they sit heavy. Pt states she has diarrhea every once in a while from eating food she is not supposed to eat such as french fries. Pt states her wife is training her in the gym. Pt states she notices she can eat more now than she could a few weeks or months ago. Pt states she has noticed she has lose skin stating she will get a tummy tuck next year and will get her breast done.  Pt states she is moving to [place] in the next few weeks.   Body Composition Scale 09/21/2020 12/16/2020  Current Body Weight 239.1 226.8  Total Body Fat % 41.5 40  Visceral Fat 11 10  Fat-Free Mass % 58.4 59.9   Total Body Water % 43.7 44.4  Muscle-Mass lbs 34.8 34.7  BMI 37.1 35.2  Body Fat Displacement           Torso  lbs 61.5 56.3         Left Leg  lbs 12.3 11.2         Right Leg  lbs 12.3 11.2         Left Arm  lbs 6.1 5.6         Right Arm   lbs 6.1 5.6    Estimated daily fluid intake: 70+ oz Estimated daily protein intake: 80+ g Supplements: celebrate and calcium 600 mg (Dietitian pt on needing to cut her tums in half and only take 1 at a time) Current average weekly physical activity: every other day gym cardio and weights (30-45 minutes)  24-Hr Dietary Recall: smoothies for snack every other day: frozen fruit, powder protein, celery, spinach, skim milk or protein shakes on the opposite days First Meal: 2 eggs + bacon + avocado or cold cereal (40 grams carb) or oatmeal + fruit Snack: yogurt or  protein shake or broccoli dipped in ranch or celery dipped in ranch or carrots or cheese or pepperoni  Second Meal: lunch meat + cheese or salad + chicken or deli meat Snack: peanuts  Third Meal: [place] breast or pork chop or baked chicken or fish or shrimp + beans + brown rice or brussels or roasted potatoes   Snack: canned fruit salad and protein candy bar Beverages: water, gatorade zero protein, gatorade zero   Post-Op Goals/ Signs/ Symptoms Using straws: no Drinking while eating: no Chewing/swallowing difficulties: no Changes in vision: no Changes to mood/headaches: no Hair loss/changes to skin/nails: no Difficulty focusing/concentrating: no Sweating: no Dizziness/lightheadedness: no Palpitations: no  Carbonated/caffeinated beverages: no N/V/D/C/Gas: no Abdominal pain: no Dumping syndrome: no   NUTRITION DIAGNOSIS  Overweight/obesity ([place]-3.3) related to past poor dietary habits and physical inactivity as evidenced by completed bariatric surgery and following dietary guidelines for continued weight loss and healthy nutrition status.  Continued Dietitian educated pt on carbohydrate intake and reaching her future goals.      NUTRITION INTERVENTION Nutrition counseling (C-1) and education (E-2) to facilitate bariatric surgery goals, including: . The importance of  consuming adequate calories as well as certain nutrients daily due to the body's need for essential vitamins, minerals, and fats . The importance of daily physical activity and to reach a goal of at least 150 minutes of moderate to vigorous physical activity weekly (or as directed by their physician) due to benefits such as increased musculature and improved lab values . The importance of intuitive eating specifically learning hunger-satiety cues and understanding the importance of learning a new body Importance of vegetables . To have an overall healthy diet, adult men and women are recommended to consume anywhere from  2-3 cups of vegetables daily. Vegetables provide a wide range of vitamins and minerals such as vitamin A, vitamin C, potassium, and folic acid. According to the [facility], including fruit and vegetables daily may reduce the risk of cardiovascular disease, certain cancers, and other non-communicable diseases. Encouraged patient to honor their body's internal hunger and fullness cues.  Throughout the day, check in mentally and rate hunger. Stop eating when satisfied not full regardless of how much food is left on the plate.  Get more if still hungry 20-30 minutes later.  The key is to honor satisfaction so throughout the meal, rate fullness factor and stop when comfortably satisfied not physically full. The key is to honor hunger and fullness without any feelings of guilt or shame.  Pay attention to what the internal cues are, rather than any external factors. This will enhance the confidence you have in listening to your own body and following those internal cues enabling you to increase how often you eat when you are hungry not out of appetite and stop when you are satisfied not full.  Encouraged pt to continue to eat balanced meals inclusive of non starchy vegetables 2 times a day 7 days a week Encouraged pt to choose lean protein sources: limiting beef, pork, sausage, hotdogs, and lunch meat Encourage pt to choose healthy fats such as plant based limiting animal fats . Encouraged pt to continue to drink a minium 64 fluid ounces with half being plain water to satisfy proper hydration   Goals: -Continue to aim for a minimum of 64 fluid ounces 7 days a week with at least 30 ounces being plain water -Eat non-starchy vegetables 2 times a day 7 days a week -Limit fruit and other complex carbohydrates (crackers, bread, oatmeal, grits, pasta, rice, beans, corn, peas, etc.) to 3 times a day; one serving per  1/3-1/2 cup Avoid juice completley  -do not eat in bwtween every 3 hours; for example a  smoothie or nuts not both   Handouts Provided Include   Learning Style & Readiness for Change Teaching method utilized: Visual & Auditory  Demonstrated degree of understanding via: Teach Back  Barriers to learning/adherence to lifestyle change: none identified   RD's Notes for Next Visit . Assess adherence to pt chosen goals   MONITORING & EVALUATION Dietary intake, weekly physical activity, body weight  Next Steps Patient is to follow-up with a bariatric dietitian in [place]

## 2021-01-21 ENCOUNTER — Telehealth (INDEPENDENT_AMBULATORY_CARE_PROVIDER_SITE_OTHER): Admitting: Psychiatry

## 2021-01-21 ENCOUNTER — Other Ambulatory Visit: Payer: Self-pay

## 2021-01-21 DIAGNOSIS — Z91199 Patient's noncompliance with other medical treatment and regimen due to unspecified reason: Secondary | ICD-10-CM | POA: Insufficient documentation

## 2021-01-21 DIAGNOSIS — Z5329 Procedure and treatment not carried out because of patient's decision for other reasons: Secondary | ICD-10-CM

## 2021-01-21 NOTE — Progress Notes (Signed)
No response to call or text or video invite  

## 2021-02-09 ENCOUNTER — Telehealth: Payer: Self-pay

## 2021-02-09 DIAGNOSIS — F3176 Bipolar disorder, in full remission, most recent episode depressed: Secondary | ICD-10-CM

## 2021-02-09 MED ORDER — DIVALPROEX SODIUM ER 500 MG PO TB24: ORAL_TABLET | ORAL | 0 refills | Status: DC

## 2021-02-09 MED ORDER — DOXEPIN HCL 10 MG PO CAPS: ORAL_CAPSULE | ORAL | 0 refills | Status: DC

## 2021-02-09 NOTE — Telephone Encounter (Signed)
Patient no showed her last appointment.  I will not prescribe future refills for this patient without an appointment.  I have sent a 30-day supply only.  Please call patient to schedule an appointment.

## 2021-02-09 NOTE — Telephone Encounter (Signed)
pt called left a message that she needs a refill on all her medications

## 2021-02-14 IMAGING — CR UPPER GI SERIES (WITHOUT KUB)
9 of 12 series · 13 of 24 positions shown · non-contrast
Comparison: None.

CLINICAL DATA: Preoperative assessment for bariatric surgery.

EXAM:
UPPER GI SERIES WITH KUB
TECHNIQUE: After obtaining a scout radiograph a routine upper GI series was
performed using thin barium
FLUOROSCOPY TIME:  Fluoroscopy Time:  3 minutes and 26 seconds.
Radiation Exposure Index (if provided by the fluoroscopic device):
92 mGy
Number of Acquired Spot Images:

[t abdomen supine]
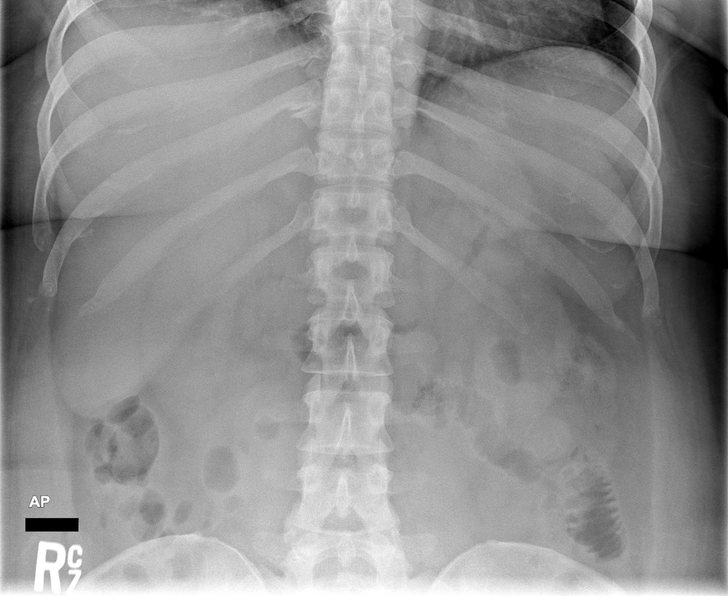

[Series 3: fluoro_barium 2fps_bw · 0.18mm/px · 2 of 16 frames shown (1 of 4)]
[frame 3/16]
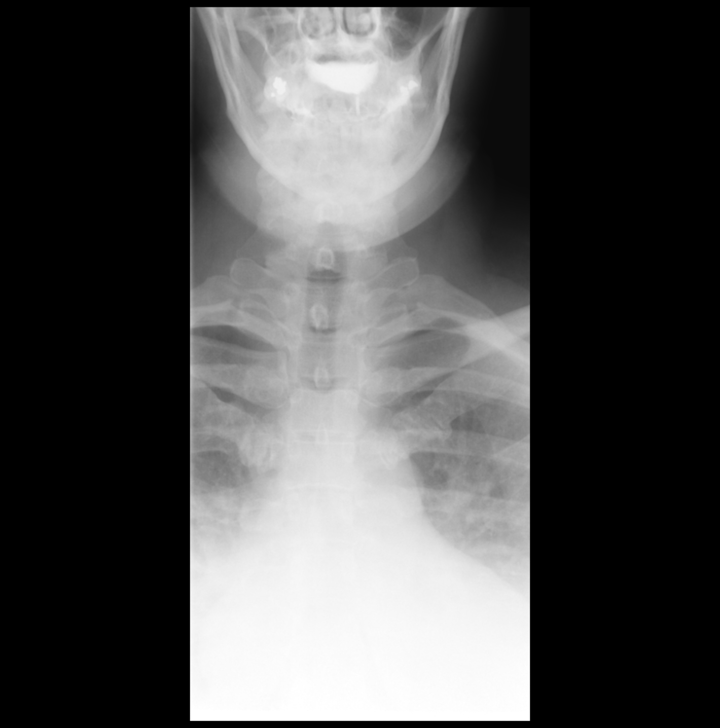
[frame 14/16]
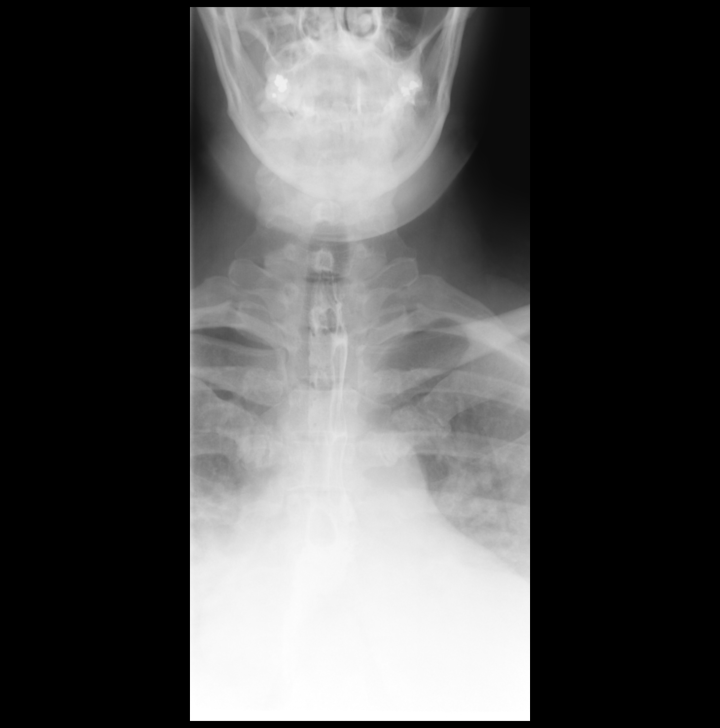

[Series 4: fluoro_barium 2fps_bw · 0.17mm/px · 2 of 15 frames shown (2 of 4)]
[frame 8/15]
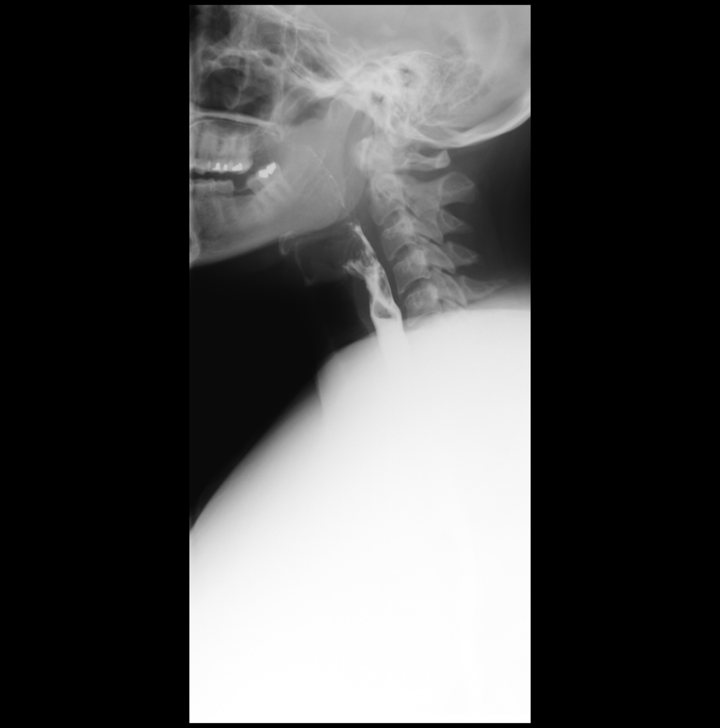
[frame 15/15]
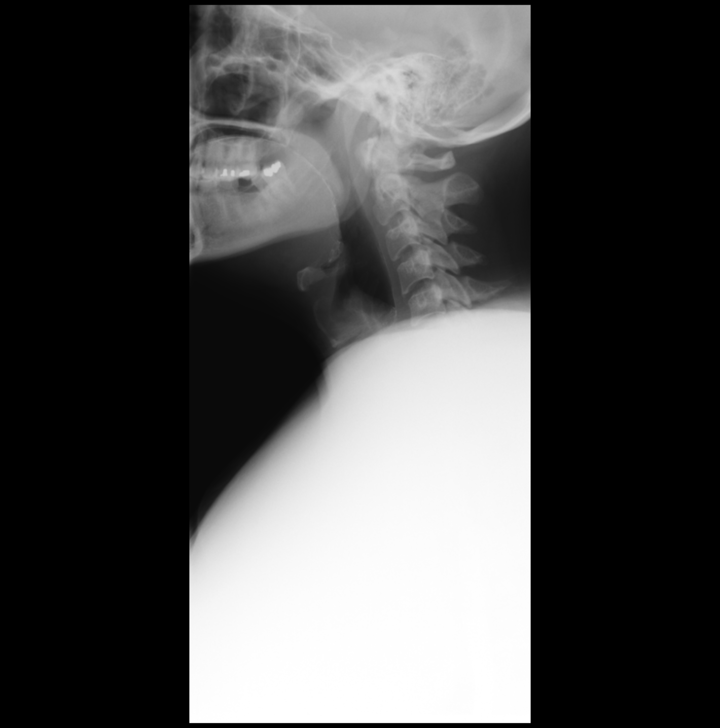

[cp_standard (1 of 4) · tomo slice 26/50.0]
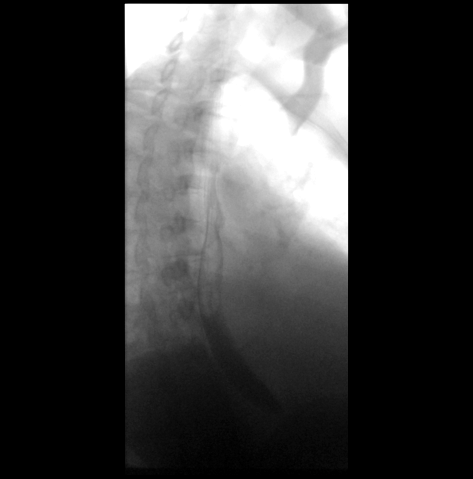

[Series 6: cp_standard · 0.56mm/px · 2 of 34 frames shown (2 of 4)]
[frame 6/34]
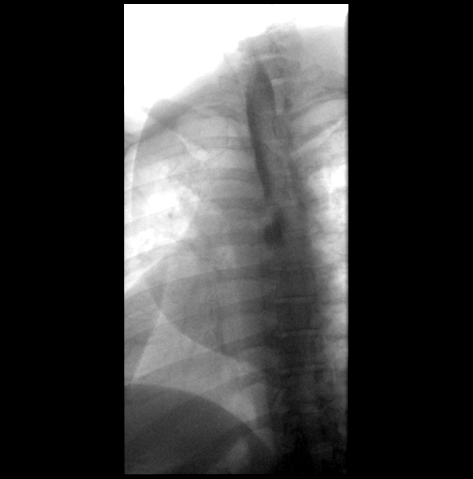
[frame 18/34]
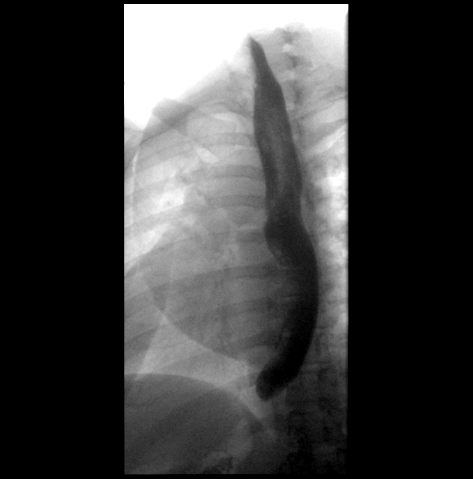

[Series 7: cp_standard · 0.56mm/px · 2 of 33 frames shown (3 of 4)]
[frame 17/33]
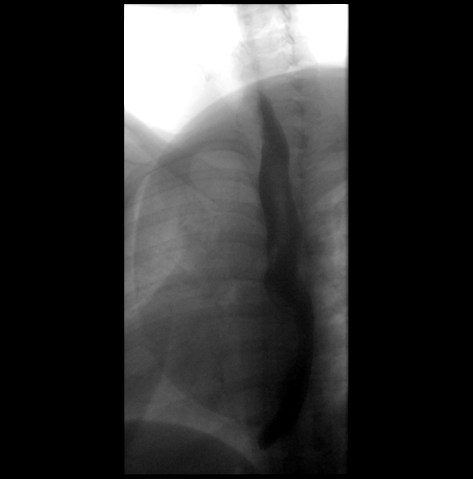
[frame 33/33]
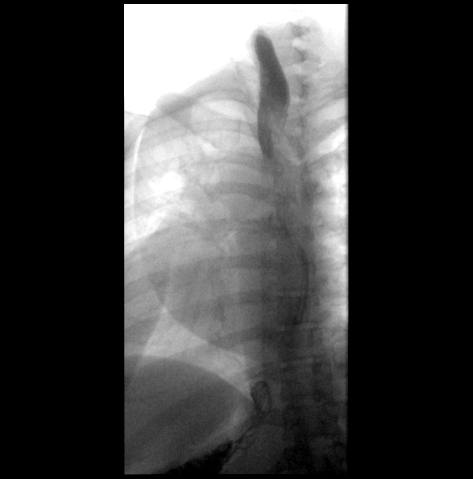

[fluoro_barium 2fps_bw (3 of 4)]
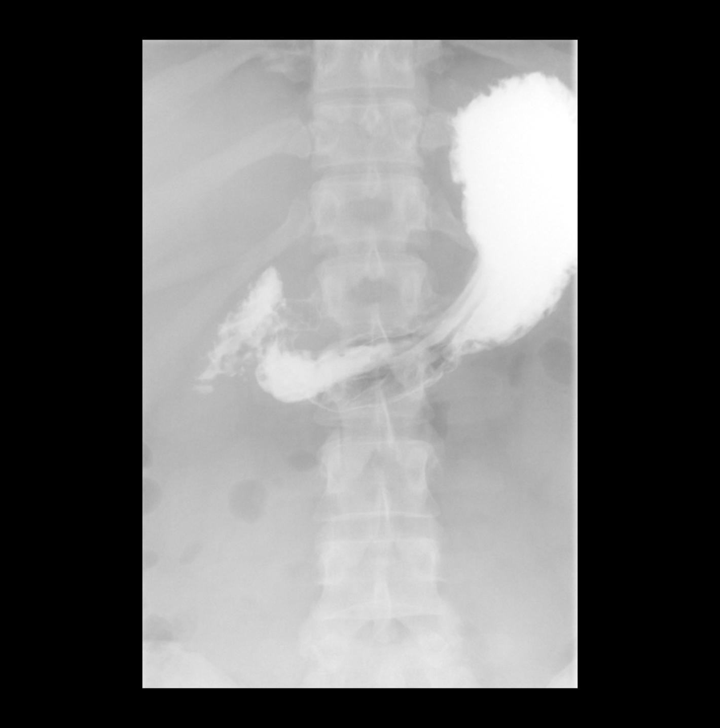

[cp_standard (4 of 4)]
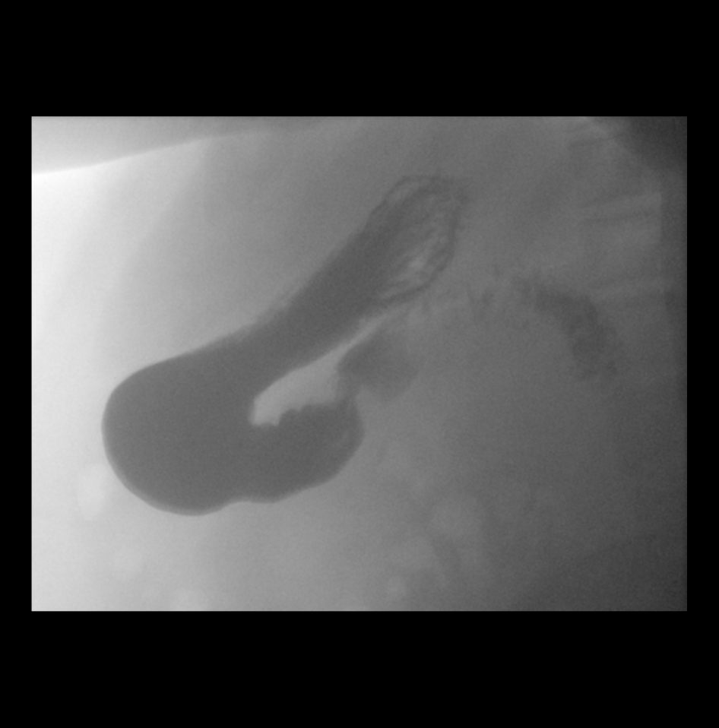

[fluoro_barium 2fps_bw (4 of 4)]
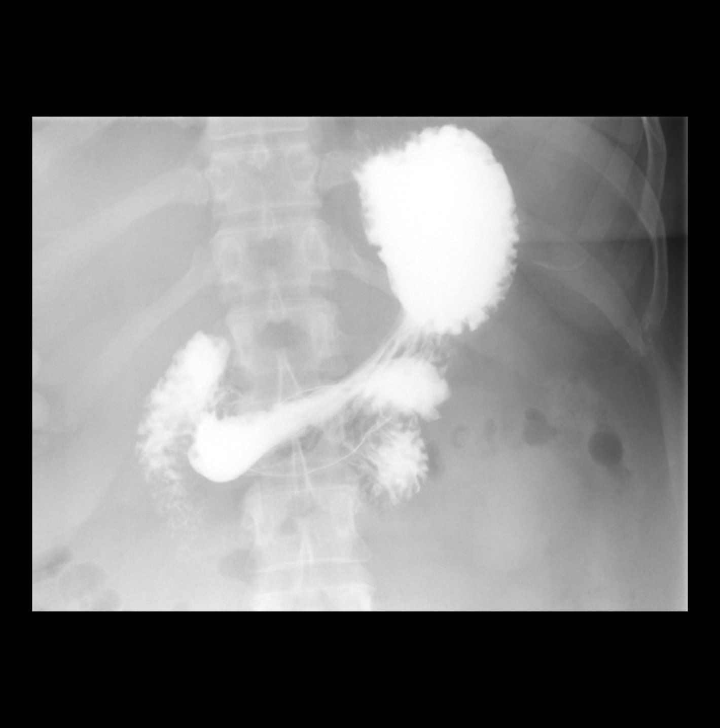

[13 of 24 positions shown; findings below may reference images not displayed]

FINDINGS: Pre-procedure KUB shows a normal bowel gas pattern.

Frontal and lateral views of the hypopharynx while swallowing are
normal.

Esophagus is normal without evidence for diverticulum, stricture, or
mass lesion. Esophageal motility is normal with mild proximal escape
on several swallows. No tertiary contractions or presbyesophagus.

No hiatal hernia. Stomach is normal on single contrast imaging
without evidence for mass-effect, gross ulceration, or stricture.
Gastric emptying is normal with normal appearing pylorus. The
duodenal C-loop and ligament of Treitz are normally positione.
IMPRESSION: Normal single contrast upper GI series.

## 2021-02-17 ENCOUNTER — Telehealth (INDEPENDENT_AMBULATORY_CARE_PROVIDER_SITE_OTHER): Admitting: Psychiatry

## 2021-02-17 ENCOUNTER — Other Ambulatory Visit: Payer: Self-pay

## 2021-02-17 ENCOUNTER — Encounter: Payer: Self-pay | Admitting: Psychiatry

## 2021-02-17 DIAGNOSIS — F411 Generalized anxiety disorder: Secondary | ICD-10-CM

## 2021-02-17 DIAGNOSIS — F3132 Bipolar disorder, current episode depressed, moderate: Secondary | ICD-10-CM | POA: Insufficient documentation

## 2021-02-17 DIAGNOSIS — F5101 Primary insomnia: Secondary | ICD-10-CM

## 2021-02-17 DIAGNOSIS — Z9189 Other specified personal risk factors, not elsewhere classified: Secondary | ICD-10-CM | POA: Insufficient documentation

## 2021-02-17 DIAGNOSIS — Z634 Disappearance and death of family member: Secondary | ICD-10-CM | POA: Diagnosis not present

## 2021-02-17 DIAGNOSIS — M545 Low back pain, unspecified: Secondary | ICD-10-CM | POA: Insufficient documentation

## 2021-02-17 MED ORDER — HYDROXYZINE HCL 25 MG PO TABS: 12.5000 mg | ORAL_TABLET | Freq: Three times a day (TID) | ORAL | 1 refills | Status: DC | PRN

## 2021-02-17 NOTE — Progress Notes (Signed)
Virtual Visit via Video Note  I connected with Gloria Garza on 02/17/21 at  4:20 PM EDT by a video enabled telemedicine application and verified that I am speaking with the correct person using two identifiers.  Location Provider Location : [facility] Patient Location : Home  Participants: Patient , Provider    I discussed the limitations of evaluation and management by telemedicine and the availability of in person appointments. The patient expressed understanding and agreed to proceed.   I discussed the assessment and treatment plan with the patient. The patient was provided an opportunity to ask questions and all were answered. The patient agreed with the plan and demonstrated an understanding of the instructions.   The patient was advised to call back or seek an in-person evaluation if the symptoms worsen or if the condition fails to improve as anticipated.   [place] MD OP Progress Note  02/17/2021 5:30 PM Gloria Garza  MRN:  [id number]  Chief Complaint:  Chief Complaint    Follow-up; Anxiety     HPI: Gloria Garza is a 40 year old Caucasian female who has a history of bipolar disorder, lives in [place], married, was evaluated by telemedicine today.  Patient was last evaluated on 08/04/2020.  Patient at that time had reported she was going to relocate to [place].  Patient today returns reporting that she changed her plans and is going to move to [place] early in June after her children completes this academic year at school.  Patient reports she is currently struggling with psychosocial stressors.  Her brother passed away in Jan 04, 2021, had lung cancer as well as heart disease.  Patient reports since then she has been feeling more and more depressed.  She reports she struggles with sadness, irritability, social withdrawal.  She reports she often feels as though she cannot function and needs to push herself.  Patient reports sleep is okay on the doxepin.  Patient is compliant on the  Depakote.  Patient denies side effects to these medications.  She reports she however ran out of her hydroxyzine and would like to go back on it if possible.  She had her gastric sleeve surgery and is currently doing well.  Patient denies any other concerns today.  Visit Diagnosis:    ICD-10-CM   1. Bipolar 1 disorder, depressed, moderate (HCC)  F31.32 hydrOXYzine (ATARAX/VISTARIL) 25 MG tablet  2. GAD (generalized anxiety disorder)  F41.1   3. Primary insomnia  F51.01   4. Bereavement  Z63.4   5. At risk for prolonged QT interval syndrome  Z91.89 EKG 12-Lead    Past Psychiatric History: I have reviewed past psychiatric history from my progress note on 12/18/2018.  Past trials of Zoloft.  Past Medical History:  Past Medical History:  Diagnosis Date  . Anxiety   . Bipolar disorder ([place])   . Depression   . Pneumonia   . Pre-diabetes     Past Surgical History:  Procedure Laterality Date  . CESAREAN SECTION     x2  . CYSTOSCOPY  09/26/2019   Procedure: CYSTOSCOPY;  Surgeon: Gae Dry, MD;  Location: [facility] ORS;  Service: Gynecology;;  . ESOPHAGOGASTRODUODENOSCOPY N/A 05/26/2020   Procedure: Upper endoscopy;  Surgeon: Greer Pickerel, MD;  Location: [facility];  Service: General;  Laterality: N/A;  . LAPAROSCOPIC GASTRIC SLEEVE RESECTION N/A 05/26/2020   Procedure: LAPAROSCOPIC GASTRIC SLEEVE;  Surgeon: Greer Pickerel, MD;  Location: [facility];  Service: General;  Laterality: N/A;  . LAPAROSCOPIC HYSTERECTOMY N/A 09/26/2019   Procedure: HYSTERECTOMY TOTAL  LAPAROSCOPIC;  Surgeon: Gae Dry, MD;  Location: [facility] ORS;  Service: Gynecology;  Laterality: N/A;  . TUBAL LIGATION      Family Psychiatric History: Reviewed family psychiatric history from my progress note on 12/18/2018  Family History:  Family History  Problem Relation Age of Onset  . Diabetes Maternal Grandmother   . Cancer - Colon Maternal Grandmother   . Diabetes Paternal Grandmother   . Heart disease Paternal  Grandmother   . Alcohol abuse Brother   . Drug abuse Brother     Social History: I have reviewed social history from my progress note on 12/18/2018 Social History   Socioeconomic History  . Marital status: Married    Spouse name: Gloria Garza  . Number of children: 2  . Years of education: Not on file  . Highest education level: Associate degree: occupational, Hotel manager, or vocational program  Occupational History  . Not on file  Tobacco Use  . Smoking status: Former Smoker    Years: 5.00    Types: Cigarettes    Quit date: 07/18/2018    Years since quitting: 2.5  . Smokeless tobacco: Never Used  Vaping Use  . Vaping Use: Never used  Substance and Sexual Activity  . Alcohol use: Not Currently    Comment: rarely  . Drug use: Never  . Sexual activity: Yes    Birth control/protection: Surgical  Other Topics Concern  . Not on file  Social History Narrative  . Not on file   Social Determinants of Health   Financial Resource Strain: Not on file  Food Insecurity: Not on file  Transportation Needs: Not on file  Physical Activity: Not on file  Stress: Not on file  Social Connections: Not on file    Allergies: No Known Allergies  Metabolic Disorder Labs: Lab Results  Component Value Date   HGBA1C 6.9 (H) 05/15/2020   MPG 151.33 05/15/2020   Lab Results  Component Value Date   PROLACTIN 14.9 12/31/2018   Lab Results  Component Value Date   CHOL 200 (H) 12/31/2018   TRIG 262 (H) 12/31/2018   HDL 48 12/31/2018   LDLCALC 100 (H) 12/31/2018   Lab Results  Component Value Date   TSH 1.340 10/29/2018    Therapeutic Level Labs: No results found for: LITHIUM Lab Results  Component Value Date   VALPROATE 33 (L) 06/30/2020   VALPROATE 44 (L) 06/05/2019   No components found for:  CBMZ  Current Medications: Current Outpatient Medications  Medication Sig Dispense Refill  . acetaminophen (TYLENOL) 500 MG tablet Take 1,000 mg by mouth every 6 (six) hours as needed (for  pain.).    Marland Kitchen divalproex (DEPAKOTE ER) 500 MG 24 hr tablet TAKE 3 TABLETS(1500 MG) BY MOUTH DAILY 90 tablet 0  . doxepin (SINEQUAN) 10 MG capsule TAKE 1 TO 2 CAPSULES(10 TO 20 MG) BY MOUTH AT BEDTIME AS NEEDED FOR SLEEP 60 capsule 0  . hydrOXYzine (ATARAX/VISTARIL) 25 MG tablet Take 0.5-1 tablets (12.5-25 mg total) by mouth 3 (three) times daily as needed. Severe anxiety and irritability 90 tablet 1  . Misc Natural Products (APPLE CIDER VINEGAR DIET PO) Take 2 tablets by mouth in the morning and at bedtime. Gummy    . ondansetron (ZOFRAN-ODT) 4 MG disintegrating tablet Take 1 tablet (4 mg total) by mouth every 6 (six) hours as needed for nausea or vomiting. 20 tablet 0  . pantoprazole (PROTONIX) 40 MG tablet Take 1 tablet (40 mg total) by mouth daily. 90 tablet 0  .  traMADol (ULTRAM) 50 MG tablet Take 1 tablet (50 mg total) by mouth every 6 (six) hours as needed (pain). 10 tablet 0   No current facility-administered medications for this visit.     Musculoskeletal: Strength & Muscle Tone: UTA Gait & Station: UTA Patient leans: N/A  Psychiatric Specialty Exam: Review of Systems  Psychiatric/Behavioral: Positive for dysphoric mood.  All other systems reviewed and are negative.   Last menstrual period 08/30/2019.There is no height or weight on file to calculate BMI.  General Appearance: Casual  Eye Contact:  Fair  Speech:  Clear and Coherent  Volume:  Normal  Mood:  Depressed  Affect:  Congruent  Thought Process:  Goal Directed and Descriptions of Associations: Intact  Orientation:  Full (Time, Place, and Person)  Thought Content: Logical   Suicidal Thoughts:  No  Homicidal Thoughts:  No  Memory:  Immediate;   Fair Recent;   Fair Remote;   Fair  Judgement:  Fair  Insight:  Fair  Psychomotor Activity:  Normal  Concentration:  Concentration: Fair and Attention Span: Fair  Recall:  [employer] of Knowledge: Fair  Language: Fair  Akathisia:  No  Handed:  Right  AIMS (if  indicated): UTA  Assets:  Communication Skills Desire for Improvement Housing Intimacy Social Support  ADL's:  Intact  Cognition: WNL  Sleep:  Fair   Screenings: Music therapist Visit from 06/04/2020 in [facility] Office Visit from 11/25/2019 in [facility] Office Visit from 07/17/2019 in [facility], [facility] Nutrition from 06/11/2019 in [facility] Office Visit from 01/16/2019 in [facility], [facility]  PHQ-2 Total Score 1 1 0 0 0       Assessment and Plan: Shian Farve is a 40 year old Caucasian female who has a history of bipolar disorder , anxiety disorder was evaluated by telemedicine today.  Patient with psychosocial stressors of death of her brother, upcoming relocation to [place].  Patient is currently struggling with depression, irritability, will benefit from the following plan.  Plan Bipolar disorder depressed-unstable Depakote ER 1500 mg p.o. daily Depakote level-06/30/2020-33-subtherapeutic. We will consider adding an antipsychotic/mood stabilizer like Abilify. However will need an EKG first.  This was discussed with patient.  Insomnia-stable Doxepin 20 mg p.o. nightly as needed  GAD-stable Restart hydroxyzine 12.5-25 mg p.o. 3 times daily as needed  Bereavement-unstable Restart hydroxyzine 12.5-25 mg p.o. 3 times daily as needed Provided information for [facility] [place], patient to call them for bereavement care/grief counseling.  She could also call the clinic here to restart therapy appointments with her therapist.  At risk for prolonged QTc-will order EKG.  She will go to [facility].  Follow-up in clinic in 2 to 3 weeks or sooner if needed.  I have spent atleast 30 minutes face to face with patient today which includes the time spent for preparing to see the patient ( e.g., review of test, records ), obtaining and to review and separately obtained history , ordering  medications and test ,psychoeducation and supportive psychotherapy and care coordination,as well as documenting clinical information in electronic health record.      Ursula Alert, MD 02/18/2021, 6:56 PM

## 2021-02-18 ENCOUNTER — Telehealth: Payer: Self-pay

## 2021-02-18 NOTE — Telephone Encounter (Signed)
spoke with patient that ekg order were faxed and confirmed to the [facility] EKG dept.  She will need to call and make an appt [phone]

## 2021-02-18 NOTE — Telephone Encounter (Signed)
faxed and confirmed the order sent to [facility] for an ekg [phone]

## 2021-02-24 ENCOUNTER — Other Ambulatory Visit: Payer: Self-pay

## 2021-02-24 ENCOUNTER — Telehealth: Payer: Self-pay | Admitting: Psychiatry

## 2021-02-24 ENCOUNTER — Ambulatory Visit
Admission: RE | Admit: 2021-02-24 | Discharge: 2021-02-24 | Disposition: A | Discharge status: Home / Self Care | Source: Ambulatory Visit | Attending: Psychiatry | Admitting: Psychiatry

## 2021-02-24 DIAGNOSIS — Z9189 Other specified personal risk factors, not elsewhere classified: Secondary | ICD-10-CM | POA: Insufficient documentation

## 2021-02-24 DIAGNOSIS — F3132 Bipolar disorder, current episode depressed, moderate: Secondary | ICD-10-CM | POA: Diagnosis present

## 2021-02-24 NOTE — Telephone Encounter (Signed)
Attempted to reach patient after reviewing EKG-which is within normal limits-normal sinus rhythm.  Left voicemail for patient to call us back if she is interested in starting a mood stabilizer-Abilify.

## 2021-03-03 ENCOUNTER — Telehealth: Payer: Self-pay | Admitting: Psychiatry

## 2021-03-03 NOTE — Telephone Encounter (Signed)
Returned call to patient.  Discussed that EKG was reviewed and we could start Abilify.  Patient reports she changed her mind and is not interested in anything that causes weight gain.  She is interested in pursuing psychotherapy and agrees to get in touch with her therapist to establish care as soon as possible.

## 2021-03-08 ENCOUNTER — Other Ambulatory Visit: Payer: Self-pay

## 2021-03-08 ENCOUNTER — Other Ambulatory Visit: Payer: Self-pay | Admitting: Psychiatry

## 2021-03-08 ENCOUNTER — Telehealth (INDEPENDENT_AMBULATORY_CARE_PROVIDER_SITE_OTHER): Admitting: Psychiatry

## 2021-03-08 DIAGNOSIS — Z5329 Procedure and treatment not carried out because of patient's decision for other reasons: Secondary | ICD-10-CM

## 2021-03-08 DIAGNOSIS — F3176 Bipolar disorder, in full remission, most recent episode depressed: Secondary | ICD-10-CM

## 2021-03-08 NOTE — Progress Notes (Signed)
No response to call or text or video invite  

## 2021-04-10 ENCOUNTER — Other Ambulatory Visit: Payer: Self-pay | Admitting: Psychiatry

## 2021-04-10 DIAGNOSIS — F3176 Bipolar disorder, in full remission, most recent episode depressed: Secondary | ICD-10-CM

## 2021-04-15 ENCOUNTER — Other Ambulatory Visit: Payer: Self-pay | Admitting: Psychiatry

## 2021-04-15 DIAGNOSIS — F3132 Bipolar disorder, current episode depressed, moderate: Secondary | ICD-10-CM

## 2021-04-26 ENCOUNTER — Telehealth (INDEPENDENT_AMBULATORY_CARE_PROVIDER_SITE_OTHER): Admitting: Psychiatry

## 2021-04-26 ENCOUNTER — Encounter: Payer: Self-pay | Admitting: Psychiatry

## 2021-04-26 ENCOUNTER — Other Ambulatory Visit: Payer: Self-pay

## 2021-04-26 DIAGNOSIS — F411 Generalized anxiety disorder: Secondary | ICD-10-CM | POA: Diagnosis not present

## 2021-04-26 DIAGNOSIS — F3176 Bipolar disorder, in full remission, most recent episode depressed: Secondary | ICD-10-CM

## 2021-04-26 DIAGNOSIS — F40298 Other specified phobia: Secondary | ICD-10-CM

## 2021-04-26 DIAGNOSIS — F5101 Primary insomnia: Secondary | ICD-10-CM | POA: Diagnosis not present

## 2021-04-26 DIAGNOSIS — Z634 Disappearance and death of family member: Secondary | ICD-10-CM | POA: Diagnosis not present

## 2021-04-26 MED ORDER — CLONAZEPAM 0.5 MG PO TABS: 0.5000 mg | ORAL_TABLET | Freq: Every day | ORAL | 0 refills | Status: DC | PRN

## 2021-04-26 MED ORDER — DIVALPROEX SODIUM ER 500 MG PO TB24: 1500.0000 mg | ORAL_TABLET | Freq: Every day | ORAL | 0 refills | Status: DC

## 2021-04-26 NOTE — Progress Notes (Signed)
Virtual Visit via Video Note  I connected with Gloria Garza on 04/26/21 at  4:30 PM EDT by a video enabled telemedicine application and verified that I am speaking with the correct person using two identifiers. Location Provider Location : Office Patient Location : Car  Participants: Patient , Provider   I discussed the limitations of evaluation and management by telemedicine and the availability of in person appointments. The patient expressed understanding and agreed to proceed.   I discussed the assessment and treatment plan with the patient. The patient was provided an opportunity to ask questions and all were answered. The patient agreed with the plan and demonstrated an understanding of the instructions.   The patient was advised to call back or seek an in-person evaluation if the symptoms worsen or if the condition fails to improve as anticipated.   [facility] MD OP Progress Note  04/26/2021 6:56 PM Gloria Garza  MRN:  [id number]  Chief Complaint:  Chief Complaint    Follow-up; Anxiety     HPI: Gloria Garza is a 40 year old Caucasian female who has a history of bipolar disorder, GAD, primary insomnia, lives in [place], married was evaluated by telemedicine today.  Patient today reports she is currently doing well with regards to her depression.  She denies any significant mood swings.  She reports she is coping with her grief better.  She is trying to stay positive and strong for her mother who is struggling due to her brother's death recently.  Patient reports she does feel overwhelmed on and off only because she has been trying to cope with a lot of things herself.  She reports her wife is currently in [place] and she has been trying to take care of the children by herself and also is in the process of relocating to [place] herself.  She however has been coping okay.  Patient does report she continues to have fear of flying and she is nervous about the flight to [place].  She is  planning to fly out in July.  In the past she has used Klonopin as needed which has helped her during her flights.  She is compliant on medications.  Patient denies side effects.  Visit Diagnosis:    ICD-10-CM   1. Bipolar 1 disorder, depressed, full remission (HCC)  F31.76 divalproex (DEPAKOTE ER) 500 MG 24 hr tablet  2. GAD (generalized anxiety disorder)  F41.1 divalproex (DEPAKOTE ER) 500 MG 24 hr tablet    clonazePAM (KLONOPIN) 0.5 MG tablet  3. Primary insomnia  F51.01   4. Bereavement  Z63.4   5. Specific phobia  F40.298     Past Psychiatric History: I have reviewed past psychiatric history from progress note on 12/18/2018.  Past trials of Zoloft  Past Medical History:  Past Medical History:  Diagnosis Date  . Anxiety   . Bipolar disorder ([place])   . Depression   . Pneumonia   . Pre-diabetes     Past Surgical History:  Procedure Laterality Date  . CESAREAN SECTION     x2  . CYSTOSCOPY  09/26/2019   Procedure: CYSTOSCOPY;  Surgeon: Gae Dry, MD;  Location: [facility];  Service: Gynecology;;  . ESOPHAGOGASTRODUODENOSCOPY N/A 05/26/2020   Procedure: Upper endoscopy;  Surgeon: Greer Pickerel, MD;  Location: [facility];  Service: General;  Laterality: N/A;  . LAPAROSCOPIC GASTRIC SLEEVE RESECTION N/A 05/26/2020   Procedure: LAPAROSCOPIC GASTRIC SLEEVE;  Surgeon: Greer Pickerel, MD;  Location: [facility];  Service: General;  Laterality: N/A;  . LAPAROSCOPIC  HYSTERECTOMY N/A 09/26/2019   Procedure: HYSTERECTOMY TOTAL LAPAROSCOPIC;  Surgeon: Gae Dry, MD;  Location: [facility];  Service: Gynecology;  Laterality: N/A;  . TUBAL LIGATION      Family Psychiatric History: I have reviewed family psychiatric history from progress note on 12/18/2018  Family History:  Family History  Problem Relation Age of Onset  . Diabetes Maternal Grandmother   . Cancer - Colon Maternal Grandmother   . Diabetes Paternal Grandmother   . Heart disease Paternal Grandmother   . Alcohol abuse Brother   .  Drug abuse Brother     Social History: I have reviewed social history from progress note on 12/18/2018 Social History   Socioeconomic History  . Marital status: Married    Spouse name: terah  . Number of children: 2  . Years of education: Not on file  . Highest education level: Associate degree: occupational, Hotel manager, or vocational program  Occupational History  . Not on file  Tobacco Use  . Smoking status: Former Smoker    Years: 5.00    Types: Cigarettes    Quit date: 07/18/2018    Years since quitting: 2.7  . Smokeless tobacco: Never Used  Vaping Use  . Vaping Use: Never used  Substance and Sexual Activity  . Alcohol use: Not Currently    Comment: rarely  . Drug use: Never  . Sexual activity: Yes    Birth control/protection: Surgical  Other Topics Concern  . Not on file  Social History Narrative  . Not on file   Social Determinants of Health   Financial Resource Strain: Not on file  Food Insecurity: Not on file  Transportation Needs: Not on file  Physical Activity: Not on file  Stress: Not on file  Social Connections: Not on file    Allergies: No Known Allergies  Metabolic Disorder Labs: Lab Results  Component Value Date   HGBA1C 6.9 (H) 05/15/2020   MPG 151.33 05/15/2020   Lab Results  Component Value Date   PROLACTIN 14.9 12/31/2018   Lab Results  Component Value Date   CHOL 200 (H) 12/31/2018   TRIG 262 (H) 12/31/2018   HDL 48 12/31/2018   LDLCALC 100 (H) 12/31/2018   Lab Results  Component Value Date   TSH 1.340 10/29/2018    Therapeutic Level Labs: No results found for: LITHIUM Lab Results  Component Value Date   VALPROATE 33 (L) 06/30/2020   VALPROATE 44 (L) 06/05/2019   No components found for:  CBMZ  Current Medications: Current Outpatient Medications  Medication Sig Dispense Refill  . clonazePAM (KLONOPIN) 0.5 MG tablet Take 1 tablet (0.5 mg total) by mouth daily as needed for anxiety. Prior to your flight 4 tablet 0  .  acetaminophen (TYLENOL) 500 MG tablet Take 1,000 mg by mouth every 6 (six) hours as needed (for pain.).    Marland Kitchen divalproex (DEPAKOTE ER) 500 MG 24 hr tablet Take 3 tablets (1,500 mg total) by mouth at bedtime. 270 tablet 0  . doxepin (SINEQUAN) 10 MG capsule TAKE 1 TO 2 CAPSULES(10 TO 20 MG) BY MOUTH AT BEDTIME AS NEEDED FOR SLEEP 60 capsule 0  . hydrOXYzine (ATARAX/VISTARIL) 25 MG tablet TAKE 1/2 TO 1 TABLET(12.5 TO 25 MG) BY MOUTH THREE TIMES DAILY AS NEEDED FOR SEVERE ANXIETY OR IRRITABILITY 90 tablet 1  . Misc Natural Products (APPLE CIDER VINEGAR DIET PO) Take 2 tablets by mouth in the morning and at bedtime. Gummy    . ondansetron (ZOFRAN-ODT) 4 MG disintegrating tablet  Take 1 tablet (4 mg total) by mouth every 6 (six) hours as needed for nausea or vomiting. 20 tablet 0  . pantoprazole (PROTONIX) 40 MG tablet Take 1 tablet (40 mg total) by mouth daily. 90 tablet 0  . traMADol (ULTRAM) 50 MG tablet Take 1 tablet (50 mg total) by mouth every 6 (six) hours as needed (pain). 10 tablet 0   No current facility-administered medications for this visit.     Musculoskeletal: Strength & Muscle Tone: UTA Gait & Station: UTA Patient leans: N/A  Psychiatric Specialty Exam: Review of Systems  Psychiatric/Behavioral: The patient is nervous/anxious.   All other systems reviewed and are negative.   Last menstrual period 08/30/2019.There is no height or weight on file to calculate BMI.  General Appearance: Casual  Eye Contact:  Good  Speech:  Normal Rate  Volume:  Normal  Mood:  Anxious Coping well  Affect:  Appropriate  Thought Process:  Goal Directed and Descriptions of Associations: Intact  Orientation:  Full (Time, Place, and Person)  Thought Content: Logical   Suicidal Thoughts:  No  Homicidal Thoughts:  No  Memory:  Immediate;   Fair Recent;   Fair Remote;   Fair  Judgement:  Fair  Insight:  Fair  Psychomotor Activity:  Normal  Concentration:  Concentration: Fair and Attention Span:  Fair  Recall:  [employer] of Knowledge: Fair  Language: Fair  Akathisia:  No  Handed:  Right  AIMS (if indicated): UTA  Assets:  Communication Skills Desire for Improvement Housing Social Support  ADL's:  Intact  Cognition: WNL  Sleep:  Fair   Screenings: GAD-7   Flowsheet Row Video Visit from 04/26/2021 in [place]  Total GAD-7 Score 11    PHQ2-9   Flowsheet Row Video Visit from 04/26/2021 in [place] Office Visit from 06/04/2020 in [facility] Visit from 11/25/2019 in [facility] [place] Visit from 07/17/2019 in [facility] Nutrition from 06/11/2019 in [place]  PHQ-2 Total Score 1 1 1  0 0    Flowsheet Row Video Visit from 04/26/2021 in [facility]       Assessment and Plan: Gloria Garza is a 40 year old Caucasian female who has a history of bipolar disorder, anxiety disorder was evaluated by telemedicine today.  Patient with psychosocial stressors of death of her brother , upcoming relocation to [place].  Patient does have anxiety due to situational stressors however is currently stable.  Plan Bipolar disorder in remission Depakote ER 1500 mg p.o. daily Depakote level-06/30/2020-33-subtherapeutic Patient however is currently doing well.  Insomnia-stable Doxepin 20 mg p.o. nightly as needed  GAD- stable Patient does have situational stressors however is coping well. Advised to use hydroxyzine 25 mg p.o. 3 times daily as needed Also advised to establish care with therapist.  Specific phobia- flying-unstable Will provide Klonopin 0.5 mg 4 tablets as needed to take her flight. Advised to establish care with a therapist. I have reviewed [place] PMP aware.  We will provide 90-day supply of her medications.  Patient to call the clinic when she is due for her medications prior to  relocating.  Follow-up in clinic as needed.  This note was generated in part or whole with voice recognition software. Voice recognition is usually quite accurate but there are transcription errors that can and very often do occur. I apologize for any typographical errors that were not detected  and corrected.      Ursula Alert, MD 04/26/2021, 6:56 PM

## 2021-04-28 ENCOUNTER — Telehealth: Payer: Self-pay

## 2021-04-28 NOTE — Telephone Encounter (Signed)
pt called states that she moved yesterday and that she needs all her medication sent to the [facility] in [place] on [address].  her address and pharmacy has been updated in the system.

## 2021-04-28 NOTE — Telephone Encounter (Signed)
If she is asking for medication refills sent out on Monday , then please ask her to talk to [facility] to do the transfer . The rest of the refill I had asked her to call back in July and it will; be send at that time to preferred pharmacy.

## 2021-05-05 ENCOUNTER — Telehealth: Payer: Self-pay

## 2021-05-05 NOTE — Telephone Encounter (Signed)
Pls ask her to do a pharmacy to pharmacy transfer and I will give future refill in July when she is due again

## 2021-05-05 NOTE — Telephone Encounter (Signed)
pt states that she needs you to send her medication to the new pharmacy [facility] in [place].

## 2021-05-11 ENCOUNTER — Other Ambulatory Visit: Payer: Self-pay | Admitting: Psychiatry

## 2021-05-11 DIAGNOSIS — F3176 Bipolar disorder, in full remission, most recent episode depressed: Secondary | ICD-10-CM

## 2021-05-12 ENCOUNTER — Other Ambulatory Visit: Payer: Self-pay | Admitting: Psychiatry

## 2021-05-12 DIAGNOSIS — F3176 Bipolar disorder, in full remission, most recent episode depressed: Secondary | ICD-10-CM

## 2021-05-12 DIAGNOSIS — F411 Generalized anxiety disorder: Secondary | ICD-10-CM

## 2021-05-12 MED ORDER — DIVALPROEX SODIUM ER 500 MG PO TB24: 1500.0000 mg | ORAL_TABLET | Freq: Every day | ORAL | 0 refills | Status: AC

## 2021-05-12 MED ORDER — CLONAZEPAM 0.5 MG PO TABS: 0.5000 mg | ORAL_TABLET | Freq: Every day | ORAL | 0 refills | Status: AC | PRN

## 2021-05-12 NOTE — Telephone Encounter (Signed)
I have sent medications to [facility] as requested.

## 2021-05-12 NOTE — Telephone Encounter (Signed)
pt called states that she needs her medication sent to [facility] in [place] they will not transfer the klonopin,

## 2021-06-01 ENCOUNTER — Telehealth: Payer: Self-pay

## 2021-06-01 DIAGNOSIS — F3132 Bipolar disorder, current episode depressed, moderate: Secondary | ICD-10-CM

## 2021-06-01 DIAGNOSIS — F3176 Bipolar disorder, in full remission, most recent episode depressed: Secondary | ICD-10-CM

## 2021-06-01 MED ORDER — HYDROXYZINE HCL 25 MG PO TABS: ORAL_TABLET | ORAL | 0 refills | Status: AC

## 2021-06-01 MED ORDER — DOXEPIN HCL 10 MG PO CAPS: 10.0000 mg | ORAL_CAPSULE | Freq: Every evening | ORAL | 0 refills | Status: AC | PRN

## 2021-06-01 NOTE — Telephone Encounter (Signed)
I have sent her medications as quested to pharmacy.

## 2021-06-01 NOTE — Telephone Encounter (Signed)
pt called left a message that she needs a refill on the hydroxyzine and the doxepin

## 2021-08-29 ENCOUNTER — Other Ambulatory Visit: Payer: Self-pay | Admitting: Psychiatry

## 2021-08-29 DIAGNOSIS — F3176 Bipolar disorder, in full remission, most recent episode depressed: Secondary | ICD-10-CM

## 2021-10-25 NOTE — Telephone Encounter (Signed)
Mirena not received. Patient decided on surgery.

## 2021-12-23 ENCOUNTER — Encounter (HOSPITAL_COMMUNITY): Payer: Self-pay | Admitting: *Deleted

## 2022-03-23 ENCOUNTER — Other Ambulatory Visit: Payer: Self-pay | Admitting: Psychiatry

## 2022-03-23 DIAGNOSIS — F3132 Bipolar disorder, current episode depressed, moderate: Secondary | ICD-10-CM

## 2022-03-24 ENCOUNTER — Other Ambulatory Visit: Payer: Self-pay | Admitting: Psychiatry

## 2022-03-24 DIAGNOSIS — F3176 Bipolar disorder, in full remission, most recent episode depressed: Secondary | ICD-10-CM

## 2022-03-24 DIAGNOSIS — F411 Generalized anxiety disorder: Secondary | ICD-10-CM

## 2022-06-28 ENCOUNTER — Other Ambulatory Visit: Payer: Self-pay | Admitting: Psychiatry

## 2022-06-28 DIAGNOSIS — F3176 Bipolar disorder, in full remission, most recent episode depressed: Secondary | ICD-10-CM

## 2022-06-28 DIAGNOSIS — F411 Generalized anxiety disorder: Secondary | ICD-10-CM
